# Patient Record
Sex: Female | Born: 1935 | Race: Black or African American | Hispanic: No | Smoking: Never smoker
Health system: Southern US, Community
[De-identification: ages and names within clinical notes are randomized; demographics above are authoritative.]

## PROBLEM LIST (undated history)

## (undated) DIAGNOSIS — I6529 Occlusion and stenosis of unspecified carotid artery: Secondary | ICD-10-CM

## (undated) DIAGNOSIS — K5090 Crohn's disease, unspecified, without complications: Secondary | ICD-10-CM

## (undated) DIAGNOSIS — E119 Type 2 diabetes mellitus without complications: Secondary | ICD-10-CM

## (undated) DIAGNOSIS — I10 Essential (primary) hypertension: Secondary | ICD-10-CM

## (undated) DIAGNOSIS — E785 Hyperlipidemia, unspecified: Secondary | ICD-10-CM

## (undated) HISTORY — DX: Occlusion and stenosis of unspecified carotid artery: I65.29

---

## 2003-11-19 ENCOUNTER — Other Ambulatory Visit: Payer: Self-pay

## 2005-08-30 ENCOUNTER — Ambulatory Visit: Payer: Self-pay | Admitting: *Deleted

## 2006-09-03 ENCOUNTER — Ambulatory Visit: Payer: Self-pay | Admitting: *Deleted

## 2006-09-10 ENCOUNTER — Ambulatory Visit: Payer: Self-pay | Admitting: Gastroenterology

## 2007-10-24 ENCOUNTER — Ambulatory Visit: Payer: Self-pay | Admitting: *Deleted

## 2008-09-14 ENCOUNTER — Ambulatory Visit: Payer: Self-pay | Admitting: Family Medicine

## 2009-01-07 ENCOUNTER — Ambulatory Visit: Payer: Self-pay | Admitting: Family Medicine

## 2009-10-18 ENCOUNTER — Ambulatory Visit: Payer: Self-pay | Admitting: Family Medicine

## 2010-01-10 ENCOUNTER — Ambulatory Visit: Payer: Self-pay | Admitting: Family Medicine

## 2010-01-17 ENCOUNTER — Ambulatory Visit: Payer: Self-pay | Admitting: Family Medicine

## 2011-02-21 ENCOUNTER — Ambulatory Visit: Payer: Self-pay | Admitting: Family Medicine

## 2011-10-27 ENCOUNTER — Emergency Department: Payer: Self-pay | Admitting: Emergency Medicine

## 2011-10-27 LAB — URINALYSIS, COMPLETE
Blood: NEGATIVE
Glucose,UR: NEGATIVE mg/dL (ref 0–75)
Hyaline Cast: 3
Nitrite: NEGATIVE
Protein: NEGATIVE
RBC,UR: 9 /HPF (ref 0–5)

## 2012-03-04 ENCOUNTER — Ambulatory Visit: Payer: Self-pay | Admitting: Family Medicine

## 2013-03-05 ENCOUNTER — Ambulatory Visit: Payer: Self-pay | Admitting: Family Medicine

## 2013-07-13 ENCOUNTER — Emergency Department: Payer: Self-pay | Admitting: Emergency Medicine

## 2014-12-31 ENCOUNTER — Inpatient Hospital Stay: Payer: Medicare Other

## 2014-12-31 ENCOUNTER — Emergency Department: Payer: Medicare Other

## 2014-12-31 ENCOUNTER — Encounter: Payer: Self-pay | Admitting: Urgent Care

## 2014-12-31 ENCOUNTER — Inpatient Hospital Stay
Admission: EM | Admit: 2014-12-31 | Discharge: 2015-01-07 | DRG: 385 | Disposition: A | Discharge status: Home / Self Care | Payer: Medicare Other | Attending: Internal Medicine | Admitting: Internal Medicine

## 2014-12-31 DIAGNOSIS — E785 Hyperlipidemia, unspecified: Secondary | ICD-10-CM | POA: Diagnosis present

## 2014-12-31 DIAGNOSIS — I10 Essential (primary) hypertension: Secondary | ICD-10-CM | POA: Diagnosis present

## 2014-12-31 DIAGNOSIS — K5731 Diverticulosis of large intestine without perforation or abscess with bleeding: Secondary | ICD-10-CM | POA: Diagnosis present

## 2014-12-31 DIAGNOSIS — Z79899 Other long term (current) drug therapy: Secondary | ICD-10-CM

## 2014-12-31 DIAGNOSIS — R609 Edema, unspecified: Secondary | ICD-10-CM | POA: Diagnosis present

## 2014-12-31 DIAGNOSIS — Z7982 Long term (current) use of aspirin: Secondary | ICD-10-CM

## 2014-12-31 DIAGNOSIS — K922 Gastrointestinal hemorrhage, unspecified: Secondary | ICD-10-CM | POA: Diagnosis present

## 2014-12-31 DIAGNOSIS — Z95828 Presence of other vascular implants and grafts: Secondary | ICD-10-CM

## 2014-12-31 DIAGNOSIS — D62 Acute posthemorrhagic anemia: Secondary | ICD-10-CM | POA: Diagnosis present

## 2014-12-31 DIAGNOSIS — Z90710 Acquired absence of both cervix and uterus: Secondary | ICD-10-CM

## 2014-12-31 DIAGNOSIS — Z888 Allergy status to other drugs, medicaments and biological substances status: Secondary | ICD-10-CM | POA: Diagnosis not present

## 2014-12-31 DIAGNOSIS — E119 Type 2 diabetes mellitus without complications: Secondary | ICD-10-CM | POA: Diagnosis present

## 2014-12-31 DIAGNOSIS — M1990 Unspecified osteoarthritis, unspecified site: Secondary | ICD-10-CM | POA: Diagnosis present

## 2014-12-31 DIAGNOSIS — K5010 Crohn's disease of large intestine without complications: Secondary | ICD-10-CM | POA: Diagnosis present

## 2014-12-31 DIAGNOSIS — K50919 Crohn's disease, unspecified, with unspecified complications: Secondary | ICD-10-CM

## 2014-12-31 DIAGNOSIS — D122 Benign neoplasm of ascending colon: Secondary | ICD-10-CM | POA: Diagnosis present

## 2014-12-31 DIAGNOSIS — K624 Stenosis of anus and rectum: Secondary | ICD-10-CM | POA: Diagnosis present

## 2014-12-31 DIAGNOSIS — K529 Noninfective gastroenteritis and colitis, unspecified: Secondary | ICD-10-CM | POA: Diagnosis present

## 2014-12-31 DIAGNOSIS — K921 Melena: Secondary | ICD-10-CM

## 2014-12-31 DIAGNOSIS — Z9049 Acquired absence of other specified parts of digestive tract: Secondary | ICD-10-CM | POA: Diagnosis present

## 2014-12-31 HISTORY — DX: Hyperlipidemia, unspecified: E78.5

## 2014-12-31 HISTORY — DX: Essential (primary) hypertension: I10

## 2014-12-31 HISTORY — DX: Crohn's disease, unspecified, without complications: K50.90

## 2014-12-31 HISTORY — DX: Type 2 diabetes mellitus without complications: E11.9

## 2014-12-31 LAB — COMPREHENSIVE METABOLIC PANEL
ALK PHOS: 65 U/L (ref 38–126)
ALT: 18 U/L (ref 14–54)
AST: 20 U/L (ref 15–41)
Albumin: 4.1 g/dL (ref 3.5–5.0)
Anion gap: 10 (ref 5–15)
BILIRUBIN TOTAL: 0.2 mg/dL — AB (ref 0.3–1.2)
BUN: 21 mg/dL — AB (ref 6–20)
CALCIUM: 9.1 mg/dL (ref 8.9–10.3)
CO2: 23 mmol/L (ref 22–32)
CREATININE: 1.17 mg/dL — AB (ref 0.44–1.00)
Chloride: 108 mmol/L (ref 101–111)
GFR calc Af Amer: 51 mL/min — ABNORMAL LOW (ref >60)
GFR, EST NON AFRICAN AMERICAN: 44 mL/min — AB (ref >60)
Glucose, Bld: 108 mg/dL — ABNORMAL HIGH (ref 65–99)
Potassium: 3.7 mmol/L (ref 3.5–5.1)
SODIUM: 141 mmol/L (ref 135–145)
Total Protein: 7 g/dL (ref 6.5–8.1)

## 2014-12-31 LAB — CBC
HCT: 35.9 % (ref 35.0–47.0)
HEMOGLOBIN: 12.1 g/dL (ref 12.0–16.0)
MCH: 30.7 pg (ref 26.0–34.0)
MCHC: 33.6 g/dL (ref 32.0–36.0)
MCV: 91.3 fL (ref 80.0–100.0)
PLATELETS: 166 10*3/uL (ref 150–440)
RBC: 3.93 MIL/uL (ref 3.80–5.20)
RDW: 13.7 % (ref 11.5–14.5)
WBC: 5.2 10*3/uL (ref 3.6–11.0)

## 2014-12-31 LAB — C DIFFICILE QUICK SCREEN W PCR REFLEX
C DIFFICILE (CDIFF) INTERP: NEGATIVE
C DIFFICLE (CDIFF) ANTIGEN: NEGATIVE
C Diff toxin: NEGATIVE

## 2014-12-31 LAB — GLUCOSE, CAPILLARY
GLUCOSE-CAPILLARY: 103 mg/dL — AB (ref 65–99)
GLUCOSE-CAPILLARY: 147 mg/dL — AB (ref 65–99)
Glucose-Capillary: 118 mg/dL — ABNORMAL HIGH (ref 65–99)

## 2014-12-31 LAB — MRSA PCR SCREENING: MRSA BY PCR: NEGATIVE

## 2014-12-31 LAB — HEMOGLOBIN
Hemoglobin: 9.2 g/dL — ABNORMAL LOW (ref 12.0–16.0)
Hemoglobin: 10.6 g/dL — ABNORMAL LOW (ref 12.0–16.0)

## 2014-12-31 LAB — ABO/RH: ABO/RH(D): A POS

## 2014-12-31 MED ORDER — SENNOSIDES-DOCUSATE SODIUM 8.6-50 MG PO TABS: 1.0000 | ORAL_TABLET | Freq: Every evening | ORAL | Status: DC | PRN

## 2014-12-31 MED ORDER — HYDROCODONE-ACETAMINOPHEN 5-325 MG PO TABS: 1.0000 | ORAL_TABLET | ORAL | Status: DC | PRN

## 2014-12-31 MED ORDER — ONDANSETRON HCL 4 MG PO TABS: 4.0000 mg | ORAL_TABLET | Freq: Four times a day (QID) | ORAL | Status: DC | PRN

## 2014-12-31 MED ORDER — QUINAPRIL HCL 10 MG PO TABS
40.0000 mg | ORAL_TABLET | Freq: Two times a day (BID) | ORAL | Status: DC
  Administered 2014-12-31 – 2015-01-07 (×12): 40 mg via ORAL
  Filled 2014-12-31 (×15): qty 4

## 2014-12-31 MED ORDER — IOHEXOL 300 MG/ML  SOLN
100.0000 mL | Freq: Once | INTRAMUSCULAR | Status: AC | PRN
Start: 2014-12-31 — End: 2014-12-31
  Administered 2014-12-31: 100 mL via INTRAVENOUS

## 2014-12-31 MED ORDER — PANTOPRAZOLE SODIUM 40 MG IV SOLR
40.0000 mg | Freq: Two times a day (BID) | INTRAVENOUS | Status: DC
  Administered 2014-12-31 – 2015-01-07 (×15): 40 mg via INTRAVENOUS
  Filled 2014-12-31 (×15): qty 40

## 2014-12-31 MED ORDER — ATENOLOL 50 MG PO TABS
50.0000 mg | ORAL_TABLET | Freq: Every day | ORAL | Status: DC
  Administered 2014-12-31 – 2015-01-07 (×8): 50 mg via ORAL
  Filled 2014-12-31 (×3): qty 1
  Filled 2014-12-31 (×2): qty 2
  Filled 2014-12-31 (×3): qty 1

## 2014-12-31 MED ORDER — ACETAMINOPHEN 650 MG RE SUPP: 650.0000 mg | Freq: Four times a day (QID) | RECTAL | Status: DC | PRN

## 2014-12-31 MED ORDER — CIPROFLOXACIN IN D5W 400 MG/200ML IV SOLN
400.0000 mg | Freq: Two times a day (BID) | INTRAVENOUS | Status: DC
  Administered 2014-12-31 – 2015-01-06 (×13): 400 mg via INTRAVENOUS
  Filled 2014-12-31 (×16): qty 200

## 2014-12-31 MED ORDER — INSULIN ASPART 100 UNIT/ML ~~LOC~~ SOLN
0.0000 [IU] | Freq: Every day | SUBCUTANEOUS | Status: DC
  Administered 2015-01-03: 2 [IU] via SUBCUTANEOUS
  Filled 2014-12-31: qty 2

## 2014-12-31 MED ORDER — METHYLPREDNISOLONE SODIUM SUCC 125 MG IJ SOLR
60.0000 mg | Freq: Two times a day (BID) | INTRAMUSCULAR | Status: DC
  Administered 2014-12-31 – 2015-01-05 (×10): 60 mg via INTRAVENOUS
  Filled 2014-12-31 (×10): qty 2

## 2014-12-31 MED ORDER — ALUM & MAG HYDROXIDE-SIMETH 200-200-20 MG/5ML PO SUSP: 30.0000 mL | Freq: Four times a day (QID) | ORAL | Status: DC | PRN

## 2014-12-31 MED ORDER — RISAQUAD PO CAPS
1.0000 | ORAL_CAPSULE | Freq: Every day | ORAL | Status: DC
  Administered 2014-12-31 – 2015-01-07 (×8): 1 via ORAL
  Filled 2014-12-31 (×8): qty 1

## 2014-12-31 MED ORDER — ACETAMINOPHEN 325 MG PO TABS
650.0000 mg | ORAL_TABLET | Freq: Four times a day (QID) | ORAL | Status: DC | PRN
Start: 2014-12-31 — End: 2015-01-07

## 2014-12-31 MED ORDER — METRONIDAZOLE IN NACL 5-0.79 MG/ML-% IV SOLN
500.0000 mg | Freq: Three times a day (TID) | INTRAVENOUS | Status: DC
  Administered 2014-12-31 – 2015-01-06 (×18): 500 mg via INTRAVENOUS
  Filled 2014-12-31 (×23): qty 100

## 2014-12-31 MED ORDER — FUROSEMIDE 40 MG PO TABS
40.0000 mg | ORAL_TABLET | Freq: Every day | ORAL | Status: DC
  Administered 2015-01-01 – 2015-01-07 (×7): 40 mg via ORAL
  Filled 2014-12-31 (×7): qty 1

## 2014-12-31 MED ORDER — ENOXAPARIN SODIUM 40 MG/0.4ML ~~LOC~~ SOLN: 40.0000 mg | SUBCUTANEOUS | Status: DC

## 2014-12-31 MED ORDER — INSULIN ASPART 100 UNIT/ML ~~LOC~~ SOLN
0.0000 [IU] | Freq: Three times a day (TID) | SUBCUTANEOUS | Status: DC
  Administered 2015-01-02 – 2015-01-04 (×4): 1 [IU] via SUBCUTANEOUS
  Administered 2015-01-04: 2 [IU] via SUBCUTANEOUS
  Administered 2015-01-05: 1 [IU] via SUBCUTANEOUS
  Administered 2015-01-05: 2 [IU] via SUBCUTANEOUS
  Administered 2015-01-05: 1 [IU] via SUBCUTANEOUS
  Administered 2015-01-06: 2 [IU] via SUBCUTANEOUS
  Administered 2015-01-06: 1 [IU] via SUBCUTANEOUS
  Administered 2015-01-06: 3 [IU] via SUBCUTANEOUS
  Administered 2015-01-07: 1 [IU] via SUBCUTANEOUS
  Administered 2015-01-07: 3 [IU] via SUBCUTANEOUS
  Filled 2014-12-31 (×3): qty 2
  Filled 2014-12-31 (×3): qty 1
  Filled 2014-12-31: qty 3
  Filled 2014-12-31: qty 1
  Filled 2014-12-31: qty 3
  Filled 2014-12-31 (×4): qty 1

## 2014-12-31 MED ORDER — PRAVASTATIN SODIUM 10 MG PO TABS
10.0000 mg | ORAL_TABLET | Freq: Every day | ORAL | Status: DC
  Administered 2014-12-31 – 2015-01-02 (×3): 10 mg via ORAL
  Filled 2014-12-31: qty 1
  Filled 2014-12-31: qty 2
  Filled 2014-12-31: qty 1

## 2014-12-31 MED ORDER — IOHEXOL 240 MG/ML SOLN
25.0000 mL | Freq: Once | INTRAMUSCULAR | Status: AC | PRN
  Administered 2014-12-31: 25 mL via ORAL

## 2014-12-31 MED ORDER — SODIUM CHLORIDE 0.9 % IV SOLN
INTRAVENOUS | Status: DC
  Administered 2014-12-31: 100 mL/h via INTRAVENOUS
  Administered 2014-12-31 – 2015-01-06 (×9): via INTRAVENOUS

## 2014-12-31 MED ORDER — CALCIUM CARBONATE-VITAMIN D 500-200 MG-UNIT PO TABS
1.0000 | ORAL_TABLET | Freq: Two times a day (BID) | ORAL | Status: DC
  Administered 2014-12-31 – 2015-01-07 (×14): 1 via ORAL
  Filled 2014-12-31 (×15): qty 1

## 2014-12-31 MED ORDER — ONDANSETRON HCL 4 MG/2ML IJ SOLN: 4.0000 mg | Freq: Four times a day (QID) | INTRAMUSCULAR | Status: DC | PRN

## 2014-12-31 MED ORDER — CLONIDINE HCL 0.1 MG PO TABS
0.3000 mg | ORAL_TABLET | Freq: Two times a day (BID) | ORAL | Status: DC
  Administered 2014-12-31 – 2015-01-07 (×15): 0.3 mg via ORAL
  Filled 2014-12-31 (×15): qty 3

## 2014-12-31 MED ORDER — AMLODIPINE BESYLATE 10 MG PO TABS
10.0000 mg | ORAL_TABLET | Freq: Every day | ORAL | Status: DC
  Administered 2015-01-01 – 2015-01-07 (×7): 10 mg via ORAL
  Filled 2014-12-31 (×8): qty 1

## 2014-12-31 NOTE — ED Notes (Signed)
Pt to ED c/o dark red colored blood in stool when she had a BM at approx 0330 this AM.

## 2014-12-31 NOTE — Progress Notes (Signed)
ANTIBIOTIC CONSULT NOTE - INITIAL  Pharmacy Consult for Cipro  Indication: unspecified indication   Allergies  Allergen Reactions  . Advil [Ibuprofen] Rash    Patient Measurements: Height: 5\' 7"  (170.2 cm) Weight: 209 lb 5 oz (94.944 kg) IBW/kg (Calculated) : 61.6 Adjusted Body Weight:   Vital Signs: Temp: 98.4 F (36.9 C) (08/05 1010) Temp Source: Oral (08/05 1010) BP: 140/78 mmHg (08/05 1010) Pulse Rate: 64 (08/05 1010) Intake/Output from previous day:   Intake/Output from this shift:    Labs:  Recent Labs  12/31/14 0500 12/31/14 0528  WBC  --  5.2  HGB  --  12.1  PLT  --  166  CREATININE 1.17*  --    Estimated Creatinine Clearance: 47.6 mL/min (by C-G formula based on Cr of 1.17). No results for input(s): VANCOTROUGH, VANCOPEAK, VANCORANDOM, GENTTROUGH, GENTPEAK, GENTRANDOM, TOBRATROUGH, TOBRAPEAK, TOBRARND, AMIKACINPEAK, AMIKACINTROU, AMIKACIN in the last 72 hours.   Microbiology: No results found for this or any previous visit (from the past 720 hour(s)).  Medical History: Past Medical History  Diagnosis Date  . Crohn's disease   . Hypertension   . Diabetes mellitus without complication   . Hyperlipemia     Medications:  Scheduled:  . acidophilus  1 capsule Oral Daily  . amLODipine  10 mg Oral Daily  . atenolol  50 mg Oral Daily  . calcium-vitamin D  1 tablet Oral BID  . ciprofloxacin  400 mg Intravenous Q12H  . cloNIDine  0.3 mg Oral BID  . enoxaparin (LOVENOX) injection  40 mg Subcutaneous Q24H  . [START ON 01/01/2015] furosemide  40 mg Oral Daily  . metronidazole  500 mg Intravenous Q8H  . pantoprazole (PROTONIX) IV  40 mg Intravenous Q12H  . pravastatin  10 mg Oral q1800  . quinapril  40 mg Oral BID   Assessment: Patient being treated for unspecified indication.   Goal of Therapy:  Resolution of condition   Plan:  Will continue cipro 400 mg iv q12 hours.   Tyliah Schlereth D 12/31/2014,10:38 AM

## 2014-12-31 NOTE — Progress Notes (Signed)
PT WITH  LARGE VOLUME BLOODY STOOL,LOOSE RED AND MULTIPLE CLOTS. DR MODY NOTIFIED. MD ORDERS PROTONIX  40MG  IV  BID AND HEMOGLOBIN AT 1400

## 2014-12-31 NOTE — Consult Note (Addendum)
Consultation  Referring Provider:     Dr Benjie Karvonen Admit date: 12/31/14 Consult date      12/31/14   Reason for Consultation:     Colitis         HPI:   Kaitlin Gomez is a 80 y.o. 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 Ileostomy with eventual takedown (2004). States she has had no known problems with her Crohns since her surgery. Last colonoscopy was 2008 by Dr Allen Norris: this revealed and anal stricture, diffuse distal ulcerations and granular tissue, and an inflamed ileocecal valve. Biopsies significant for active colitis to IC and distal biopsies, as well as an inflammatory polyp. States she has been on no medications for her Crohns disease since her surgery, doesn't remember what she took. Used to follow with Dr Rockney Ghee as well.  States she was in her usual state of health until 0300 this am. Woke with a dry mouth, then went to defecate. Passed stool and large amount of red blood. Had several other bloody stools, and presented to the ED this am for evaluation, and was subsequently admitted. States she has had no significant abdominal pain, and states no unusual crampiness. Had nausea for a little while when she got to her hospital room, but not now or at other times. Denies vomiting, dyspepsia, unplanned weight loss, and all further GI complaints. Denies recent antibiotics, medication changes, sick contacts, dietary changes, and anything out of the ordinary prior to symptom onset. Does admit to a monthly loose stool, and intermittent mild lower abdominal cramps, but states overall she feels very well in her abdomen, and does not normally see any blood or unusual issue in her bowel movments. States she eats well and has good appetite. Is on meloxicam for arthritis, but no other NSAIDs.     Past Medical History   Diagnosis Date  . Crohn's disease   . Hypertension   . Diabetes mellitus without complication   . Hyperlipemia   Arthritis Carpal Tunnel  History reviewed: Carpal Tunnel surgery, hysterectomy, sigmoid resection with eventual takedown (2004). Possible proximal colon resection, but cannot find any other evidence of this other than brief note in [facility] chart - may have been done 1996.   History reviewed. There is no family history of IBD, colorectal cancer, colon polyps  History  Substance Use Topics  . Smoking status: Never Smoker   . Smokeless tobacco: Not on file  . Alcohol Use: No    Prior to Admission medications   Medication Sig Start Date End Date Taking? Authorizing Provider  amLODipine (NORVASC) 10 MG tablet Take 10 mg by mouth daily.   Yes Historical Provider, MD  aspirin 81 MG chewable tablet Chew 81 mg by mouth daily.   Yes Historical Provider, MD  atenolol (TENORMIN) 50 MG tablet Take 50 mg by mouth daily.   Yes Historical Provider, MD  calcium-vitamin D (OSCAL 500/200 D-3) 500-200 MG-UNIT per tablet Take 1 tablet by mouth 2 (two) times daily.   Yes Historical Provider, MD  cloNIDine (CATAPRES) 0.3 MG tablet Take 0.3 mg by mouth.   Yes Historical Provider, MD  furosemide (LASIX) 40 MG tablet Take 40 mg by mouth daily.   Yes Historical Provider, MD  lovastatin (MEVACOR) 40 MG tablet Take 40 mg by mouth at bedtime.   Yes Historical Provider, MD  metFORMIN (  GLUCOPHAGE) 500 MG tablet Take 500 mg by mouth daily.   Yes Historical Provider, MD  quinapril (ACCUPRIL) 40 MG tablet Take 40 mg by mouth 2 (two) times daily.   Yes Historical Provider, MD  meloxicam (MOBIC) 7.5 MG tablet Take 7.5 mg by mouth daily.    Historical Provider, MD    Current Facility-Administered Medications  Medication Dose Route Frequency Provider Last Rate Last Dose  . 0.9 %  sodium chloride infusion   Intravenous Continuous Bettey Costa, MD 100 mL/hr at 12/31/14 1038    . acetaminophen (TYLENOL) tablet 650  mg  650 mg Oral Q6H PRN Bettey Costa, MD       Or  . acetaminophen (TYLENOL) suppository 650 mg  650 mg Rectal Q6H PRN Bettey Costa, MD      . acidophilus (RISAQUAD) capsule 1 capsule  1 capsule Oral Daily Bettey Costa, MD   1 capsule at 12/31/14 1155  . alum & mag hydroxide-simeth (MAALOX/MYLANTA) 200-200-20 MG/5ML suspension 30 mL  30 mL Oral Q6H PRN Sital Mody, MD      . amLODipine (NORVASC) tablet 10 mg  10 mg Oral Daily Sital Mody, MD      . atenolol (TENORMIN) tablet 50 mg  50 mg Oral Daily Sital Mody, MD   50 mg at 12/31/14 1155  . calcium-vitamin D (OSCAL WITH D) 500-200 MG-UNIT per tablet 1 tablet  1 tablet Oral BID Bettey Costa, MD   1 tablet at 12/31/14 1155  . ciprofloxacin (CIPRO) IVPB 400 mg  400 mg Intravenous Q12H Bettey Costa, MD   400 mg at 12/31/14 1154  . cloNIDine (CATAPRES) tablet 0.3 mg  0.3 mg Oral BID Bettey Costa, MD   0.3 mg at 12/31/14 1156  . [START ON 01/01/2015] furosemide (LASIX) tablet 40 mg  40 mg Oral Daily Sital Mody, MD      . HYDROcodone-acetaminophen (NORCO/VICODIN) 5-325 MG per tablet 1-2 tablet  1-2 tablet Oral Q4H PRN Sital Mody, MD      . insulin aspart (novoLOG) injection 0-5 Units  0-5 Units Subcutaneous QHS Sital Mody, MD      . insulin aspart (novoLOG) injection 0-9 Units  0-9 Units Subcutaneous TID WC Sital Mody, MD      . methylPREDNISolone sodium succinate (SOLU-MEDROL) 125 mg/2 mL injection 60 mg  60 mg Intravenous Q12H Sital Mody, MD      . metroNIDAZOLE (FLAGYL) IVPB 500 mg  500 mg Intravenous Q8H Sital Mody, MD      . ondansetron (ZOFRAN) tablet 4 mg  4 mg Oral Q6H PRN Bettey Costa, MD       Or  . ondansetron (ZOFRAN) injection 4 mg  4 mg Intravenous Q6H PRN Sital Mody, MD      . pantoprazole (PROTONIX) injection 40 mg  40 mg Intravenous Q12H Sital Mody, MD   40 mg at 12/31/14 1155  . pravastatin (PRAVACHOL) tablet 10 mg  10 mg Oral q1800 Sital Mody, MD      . quinapril (ACCUPRIL) tablet 40 mg  40 mg Oral BID Bettey Costa, MD   40 mg at 12/31/14 1155     Allergies as of 12/31/2014 - Review Complete 12/31/2014  Allergen Reaction Noted  . Advil [ibuprofen] Rash 12/31/2014     Review of Systems:    All systems reviewed and negative except where noted in HPI.    Physical Exam:  Vital signs in last 24 hours: Temp:  [97.9 F (36.6 C)-98.4 F (36.9 C)] 98.4 F (36.9 C) (08/05  1010) Pulse Rate:  [49-85] 64 (08/05 1010) Resp:  [16-20] 18 (08/05 1010) BP: (103-157)/(53-103) 140/78 mmHg (08/05 1010) SpO2:  [99 %-100 %] 100 % (08/05 1010) Weight:  [86.183 kg (190 lb)-94.944 kg (209 lb 5 oz)] 94.944 kg (209 lb 5 oz) (08/05 1018)   General:   Pleasant elderly woman in NAD Head:  Normocephalic and atraumatic. Eyes:   No icterus.   Conjunctiva pink. Ears:  Normal auditory acuity. Mouth: Mucosa pink moist, no lesions. Neck:  Supple; no masses felt Lungs: Respirations even and unlabored. Lungs clear to auscultation bilaterally.   No wheezes, crackles, or rhonchi.  Heart:  S1S2, RRR, no MRG. No edema. Abdomen:   Flat, soft, nondistended, nontender. Normal bowel sounds. No appreciable masses or hepatomegaly. No rebound signs or other peritoneal signs. Rectal:  Significant anal stricture. Tender. Bloody effluent. Cannot traverse stricture.  Msk:  MAEW x4, No clubbing or cyanosis. Strength 5/5. Symmetrical without gross deformities. Neurologic:  Alert and  oriented x4;  Cranial nerves II-XII intact.  Skin:  Warm, dry, pink without significant lesions or rashes. Psych:  Alert and cooperative. Normal affect.  LAB RESULTS:  Recent Labs  12/31/14 0528  WBC 5.2  HGB 12.1  HCT 35.9  PLT 166   BMET  Recent Labs  12/31/14 0500  NA 141  K 3.7  CL 108  CO2 23  GLUCOSE 108*  BUN 21*  CREATININE 1.17*  CALCIUM 9.1   LFT  Recent Labs  12/31/14 0500  PROT 7.0  ALBUMIN 4.1  AST 20  ALT 18  ALKPHOS 65  BILITOT 0.2*   PT/INR No results for input(s): LABPROT, INR in the last 72 hours.  STUDIES: Ct Abdomen Pelvis W  Contrast  12/31/2014   CLINICAL DATA:  Hematochezia.  EXAM: CT ABDOMEN AND PELVIS WITH CONTRAST  TECHNIQUE: Multidetector CT imaging of the abdomen and pelvis was performed using the standard protocol following bolus administration of intravenous contrast.  CONTRAST:  167mL OMNIPAQUE IOHEXOL 300 MG/ML  SOLN  COMPARISON:  None.  FINDINGS: There appears to be prior left hemi colectomy with unremarkable appearances of the anastomosis. There is abnormal mural thickening and mural enhancement in the distal 15 cm of colon. This could represent colitis or ischemia. There is no bowel obstruction. There is no extraluminal air.  There are normal appearances of the liver, gallbladder, bile ducts, spleen, pancreas, adrenals and kidneys except for left nephrolithiasis with at least 3 lower pole collecting system calculi measuring up to 6 mm. The abdominal aorta is normal in caliber. There is moderate atherosclerotic calcification. There is no adenopathy in the abdomen or pelvis. The stomach and small bowel appear unremarkable. There is no adenopathy. There is no ascites.  There is no significant skeletal lesion. There is moderately severe degenerative lumbar disc disease, greatest at L5-S1. There is minimal scarring or atelectasis in the right lung base.  IMPRESSION: 1. Prior left hemi colectomy with unremarkable anastomosis. 2. Abnormal mural thickening and mural enhancement in the distal 15 cm of colon, likely colitis or ischemia. 3. Left nephrolithiasis 4. Lumbar degenerative disc disease   Electronically Signed   By: Andreas Newport M.D.   On: 12/31/2014 06:47       Impression / Plan:   1. Rectal bleeding/crohns disease/abnormal CT/abnormal rectal exam. Diverticular bleeding is also in her ddx. She is hemodynamically stable and last bm an hour ago- by her report this seems to be slowing.   Agree with following hgb, transfuse prn. Agree with stool  studies/abx, will likely need luminal evaluation. For now, if she has  a significant bleeding episode, recommend stat GIB scan. Will discuss further with Dr Gustavo Lah.   Thank you very much for this consult. These services were provided by Stephens November, NP-C, in collaboration with Lollie Sails, MD, with whom I have discussed this patient in full.   Stephens November, NP-C  Patient with repeat episode of rectal bleeding, bright red, at about 6:50 pm.  Mild dizziness, mild decline of bp.  Rectal exam showing relatively high grade stricture in the upper anal canal that does not admit the little finger on exam.  Abnormal rectal also from firmness/nodularity  of the anal canal.  Will obtain stat GI bleeding scan, discussed with DR Anselm Jungling and Dr Ronalee Belts.  She may not be a candidate for microembolization due to previous disease and high risk of complication on procedure.  Agree with transfer to CCU for closer observation, serial hgb.  Awaitnign results of stat hgb.  Will need t+s.   Lollie Sails, MD

## 2014-12-31 NOTE — Progress Notes (Signed)
RN SPOKE TO DR MODY. RE: ACTIVELY BLEEDING  AND LOVENOX ORDERED. MD FEELS PT HAS CROHNS FLARE. MD REPORTS SHE WILL HOLD LOVENOX AT THIS TIME

## 2014-12-31 NOTE — ED Notes (Signed)
Lab called to request redraw on lavender tube due to sample hemolysis. Recollected lavender and blue top tubes and sent to lab as requested. Samples obtained from previously established PIV.

## 2014-12-31 NOTE — Progress Notes (Signed)
RN RECEIVED CALL FROM CCU. HR DOWN TO 44. VSS. PT ASYMPTOMATIC. PT RECEIVED ATENOLOL TODAY. MD REPORTS WILL RE-EVAL IN AM IF NO SX

## 2014-12-31 NOTE — Progress Notes (Signed)
Patient transported to nuclear med via bed for bleed scan . Reported to [facility] RN in ccu where patient will be transferred  To bed 5 past  scan ,IV  fluids  infusing .Marland Kitchen Telemetry  in place  Sinus brady 59 . Alert and oriented. VSS patient in no distress.

## 2014-12-31 NOTE — ED Provider Notes (Signed)
[facility] Emergency Department Provider Note  ____________________________________________  Time seen: 5:00 AM  I have reviewed the triage vital signs and the nursing notes.   HISTORY  Chief Complaint Rectal Bleeding      HPI Kaitlin Gomez is a 80 y.o. female presents with acute onset of bright red blood per rectum with onset at 3:30 AM this morning. Patient also admits to mild abdominal discomfort. Of note patient to history of Crohn's disease and partial bowel resection in the past.    Past Medical History  Diagnosis Date  . Crohn's disease   . Hypertension   . Diabetes mellitus without complication   . Hyperlipemia     There are no active problems to display for this patient.   No past surgical history on file.  Current Outpatient Rx  Name  Route  Sig  Dispense  Refill  . amLODipine (NORVASC) 10 MG tablet   Oral   Take 10 mg by mouth daily.         Marland Kitchen aspirin 81 MG chewable tablet   Oral   Chew 81 mg by mouth daily.         Marland Kitchen atenolol (TENORMIN) 50 MG tablet   Oral   Take 50 mg by mouth daily.         . calcium-vitamin D (OSCAL 500/200 D-3) 500-200 MG-UNIT per tablet   Oral   Take 1 tablet by mouth 2 (two) times daily.         . cloNIDine (CATAPRES) 0.3 MG tablet   Oral   Take 0.3 mg by mouth.         . furosemide (LASIX) 40 MG tablet   Oral   Take 40 mg by mouth daily.         Marland Kitchen lovastatin (MEVACOR) 40 MG tablet   Oral   Take 40 mg by mouth at bedtime.         . meloxicam (MOBIC) 7.5 MG tablet   Oral   Take 7.5 mg by mouth daily.         . metFORMIN (GLUCOPHAGE) 500 MG tablet   Oral   Take 500 mg by mouth daily.         . quinapril (ACCUPRIL) 40 MG tablet   Oral   Take 40 mg by mouth 2 (two) times daily.           Allergies Advil  No family history on file.  Social History History  Substance Use Topics  . Smoking status: Never Smoker   . Smokeless tobacco: Not on file  . Alcohol  Use: No    Review of Systems  Constitutional: Negative for fever. Eyes: Negative for visual changes. ENT: Negative for sore throat. Cardiovascular: Negative for chest pain. Respiratory: Negative for shortness of breath. Gastrointestinal: Negative for abdominal pain, vomiting and diarrhea. Bright red blood per rectum positive Genitourinary: Negative for dysuria. Musculoskeletal: Negative for back pain. Skin: Negative for rash. Neurological: Negative for headaches, focal weakness or numbness.   10-point ROS otherwise negative.  ____________________________________________   PHYSICAL EXAM:  VITAL SIGNS: ED Triage Vitals  Enc Vitals Group     BP 12/31/14 0432 128/103 mmHg     Pulse Rate 12/31/14 0432 66     Resp 12/31/14 0432 20     Temp 12/31/14 0432 98.3 F (36.8 C)     Temp Source 12/31/14 0432 Oral     SpO2 12/31/14 0432 99 %     Weight  12/31/14 0432 190 lb (86.183 kg)     Height 12/31/14 0432 5\' 7"  (1.702 m)     Head Cir --      Peak Flow --      Pain Score 12/31/14 0433 0     Pain Loc --      Pain Edu? --      Excl. in [place]? --      Constitutional: Alert and oriented. Well appearing and in no distress. Eyes: Conjunctivae are normal. PERRL. Normal extraocular movements. ENT   Head: Normocephalic and atraumatic.   Nose: No congestion/rhinnorhea.   Mouth/Throat: Mucous membranes are moist.   Neck: No stridor. Cardiovascular: Normal rate, regular rhythm. Normal and symmetric distal pulses are present in all extremities. No murmurs, rubs, or gallops. Respiratory: Normal respiratory effort without tachypnea nor retractions. Breath sounds are clear and equal bilaterally. No wheezes/rales/rhonchi. Gastrointestinal: Left lower quadrant pain with palpation. No distention. There is no CVA tenderness. Guaiac positive bright red blood per rectum Genitourinary: deferred Musculoskeletal: Nontender with normal range of motion in all extremities. No joint effusions.   No lower extremity tenderness nor edema. Neurologic:  Normal speech and language. No gross focal neurologic deficits are appreciated. Speech is normal.  Skin:  Skin is warm, dry and intact. No rash noted. Psychiatric: Mood and affect are normal. Speech and behavior are normal. Patient exhibits appropriate insight and judgment.  ____________________________________________    LABS (pertinent positives/negatives)  Labs Reviewed  COMPREHENSIVE METABOLIC PANEL - Abnormal; Notable for the following:    Glucose, Bld 108 (*)    BUN 21 (*)    Creatinine, Ser 1.17 (*)    Total Bilirubin 0.2 (*)    GFR calc non Af Amer 44 (*)    GFR calc Af Amer 51 (*)    All other components within normal limits  CBC  TYPE AND SCREEN       RADIOLOGY    CT Abdomen Pelvis W Contrast (Final result) Result time: 12/31/14 06:47:47   Final result by Rad Results In Interface (12/31/14 06:47:47)   Narrative:   CLINICAL DATA: Hematochezia.  EXAM: CT ABDOMEN AND PELVIS WITH CONTRAST  TECHNIQUE: Multidetector CT imaging of the abdomen and pelvis was performed using the standard protocol following bolus administration of intravenous contrast.  CONTRAST: 160mL OMNIPAQUE IOHEXOL 300 MG/ML SOLN  COMPARISON: None.  FINDINGS: There appears to be prior left hemi colectomy with unremarkable appearances of the anastomosis. There is abnormal mural thickening and mural enhancement in the distal 15 cm of colon. This could represent colitis or ischemia. There is no bowel obstruction. There is no extraluminal air.  There are normal appearances of the liver, gallbladder, bile ducts, spleen, pancreas, adrenals and kidneys except for left nephrolithiasis with at least 3 lower pole collecting system calculi measuring up to 6 mm. The abdominal aorta is normal in caliber. There is moderate atherosclerotic calcification. There is no adenopathy in the abdomen or pelvis. The stomach and small bowel appear  unremarkable. There is no adenopathy. There is no ascites.  There is no significant skeletal lesion. There is moderately severe degenerative lumbar disc disease, greatest at L5-S1. There is minimal scarring or atelectasis in the right lung base.  IMPRESSION: 1. Prior left hemi colectomy with unremarkable anastomosis. 2. Abnormal mural thickening and mural enhancement in the distal 15 cm of colon, likely colitis or ischemia. 3. Left nephrolithiasis 4. Lumbar degenerative disc disease   Electronically Signed By: Andreas Newport M.D. On: 12/31/2014 06:47  INITIAL IMPRESSION / ASSESSMENT AND PLAN / ED COURSE  Pertinent labs & imaging results that were available during my care of the patient were reviewed by me and considered in my medical decision making (see chart for details).    ____________________________________________   FINAL CLINICAL IMPRESSION(S) / ED DIAGNOSES  Final diagnoses:  Lower GI bleed  Crohn's disease, unspecified complication      Gregor Hams, MD 01/05/15 [phone]

## 2014-12-31 NOTE — ED Notes (Signed)
Attempted to call report. RN who is receiving pt is in meeting and is also charge. Will call back

## 2014-12-31 NOTE — Progress Notes (Signed)
I personally reviewed her CT scan. Her SMA is widely patent. 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 IMA is also patent. Given this pattern it is highly unlikely that she has mesenteric ischemia based on low arterial inflow. Given her history of Crohn's and her past surgeries it is more likely that these are related to her presenting complaints.  At this time I do not anticipate angiography or intervention.  Full consult to follow

## 2014-12-31 NOTE — H&P (Signed)
[facility] [place] NAME: Kaitlin Gomez    MR#:  [id number]  DATE OF BIRTH:  12/29/1934  DATE OF ADMISSION:  12/31/2014  PRIMARY CARE PHYSICIAN: Marguerita Merles, MD   REQUESTING/REFERRING PHYSICIAN: Dr Edd Fabian  CHIEF COMPLAINT:  Abdominal pain with bloody bowel movements since 3 AM HISTORY OF PRESENT ILLNESS:  Kaitlin Gomez  is a 80 y.o. female with a known history of Crohn's disease, hypertension and diabetes who presents with above complaint. Since 3 AM patient's had for episodes of bloody diarrhea and abdominal pain. She is also having some nausea without vomiting. She denies eating anything abnormal. She denies fever and chills.  PAST MEDICAL HISTORY:   Past Medical History  Diagnosis Date  . Crohn's disease   . Hypertension   . Diabetes mellitus without complication   . Hyperlipemia     PAST SURGICAL HISTORY:  Hysterectomy Part of her small intestine removed  SOCIAL HISTORY:   History  Substance Use Topics  . Smoking status: Never Smoker   . Smokeless tobacco: Not on file  . Alcohol Use: No    FAMILY HISTORY:  Her folks died of old age.  DRUG ALLERGIES:   Allergies  Allergen Reactions  . Advil [Ibuprofen] Rash     REVIEW OF SYSTEMS:  CONSTITUTIONAL: No fever or chills positive weakness  EYES: No blurred or double vision.  EARS, NOSE, AND THROAT: No tinnitus or ear pain.  RESPIRATORY: No cough, shortness of breath, wheezing or hemoptysis.  CARDIOVASCULAR: No chest pain, orthopnea,++Chornic LEE.  GASTROINTESTINAL: Positive nausea, no vomiting, positive for bloody stools GENITOURINARY: No dysuria, hematuria.  ENDOCRINE: No polyuria, nocturia,  HEMATOLOGY: No anemia, easy bruising or bleeding SKIN: No rash or lesion. MUSCULOSKELETAL: No joint pain or arthritis.   NEUROLOGIC: No tingling, numbness, positive generalized weakness.  PSYCHIATRY: No anxiety or depression.   MEDICATIONS AT HOME:   Prior to  Admission medications   Medication Sig Start Date End Date Taking? Authorizing Provider  amLODipine (NORVASC) 10 MG tablet Take 10 mg by mouth daily.   Yes Historical Provider, MD  aspirin 81 MG chewable tablet Chew 81 mg by mouth daily.   Yes Historical Provider, MD  atenolol (TENORMIN) 50 MG tablet Take 50 mg by mouth daily.   Yes Historical Provider, MD  calcium-vitamin D (OSCAL 500/200 D-3) 500-200 MG-UNIT per tablet Take 1 tablet by mouth 2 (two) times daily.   Yes Historical Provider, MD  cloNIDine (CATAPRES) 0.3 MG tablet Take 0.3 mg by mouth.   Yes Historical Provider, MD  furosemide (LASIX) 40 MG tablet Take 40 mg by mouth daily.   Yes Historical Provider, MD  lovastatin (MEVACOR) 40 MG tablet Take 40 mg by mouth at bedtime.   Yes Historical Provider, MD  metFORMIN (GLUCOPHAGE) 500 MG tablet Take 500 mg by mouth daily.   Yes Historical Provider, MD  quinapril (ACCUPRIL) 40 MG tablet Take 40 mg by mouth 2 (two) times daily.   Yes Historical Provider, MD  meloxicam (MOBIC) 7.5 MG tablet Take 7.5 mg by mouth daily.    Historical Provider, MD      VITAL SIGNS:  Blood pressure 140/78, pulse 64, temperature 98.4 F (36.9 C), temperature source Oral, resp. rate 18, height 5\' 7"  (1.702 m), weight 94.944 kg (209 lb 5 oz), SpO2 100 %.  PHYSICAL EXAMINATION:  GENERAL:  80 y.o.-year-old patient lying in the bed with no acute distress.  EYES: Pupils equal, round, reactive to light and accommodation.  No scleral icterus. Extraocular muscles intact.  HEENT: Head atraumatic, normocephalic. Oropharynx and nasopharynx clear.  NECK:  Supple, no jugular venous distention. No thyroid enlargement, no tenderness.  LUNGS: Normal breath sounds bilaterally, no wheezing, rales,rhonchi or crepitation. No use of accessory muscles of respiration.  CARDIOVASCULAR: S1, S2 normal. No murmurs, rubs, or gallops.  ABDOMEN: Soft, nontender, nondistended. Bowel sounds present. No organomegaly or mass.  EXTREMITIES:  ++LEE, NO cyanosis, or clubbing.  NEUROLOGIC: Cranial nerves II through XII are grossly intact. No focal deficits. PSYCHIATRIC: The patient is alert and oriented x 3.  SKIN: No obvious rash, lesion, or ulcer.   LABORATORY PANEL:   CBC  Recent Labs Lab 12/31/14 0528  WBC 5.2  HGB 12.1  HCT 35.9  PLT 166   ------------------------------------------------------------------------------------------------------------------  Chemistries   Recent Labs Lab 12/31/14 0500  NA 141  K 3.7  CL 108  CO2 23  GLUCOSE 108*  BUN 21*  CREATININE 1.17*  CALCIUM 9.1  AST 20  ALT 18  ALKPHOS 65  BILITOT 0.2*   ------------------------------------------------------------------------------------------------------------------  Cardiac Enzymes No results for input(s): TROPONINI in the last 168 hours. ------------------------------------------------------------------------------------------------------------------  RADIOLOGY:  Ct Abdomen Pelvis W Contrast  12/31/2014    IMPRESSION: 1. Prior left hemi colectomy with unremarkable anastomosis. 2. Abnormal mural thickening and mural enhancement in the distal 15 cm of colon, likely colitis or ischemia. 3. Left nephrolithiasis 4. Lumbar degenerative disc disease   Electronically Signed   By: Andreas Newport M.D.   On: 12/31/2014 06:47    EKG:    IMPRESSION AND PLAN:  This is a very pleasant 80 year old female with a history of Crohn disease who presents with bloody stools and likely Crohn's flare.   1. Crohn's flare: Patient will be admitted to the hospital service. I have started empiric antibiotics including ciprofloxacin and Flagyl. Patient will also be started on IV steroids. GI has been consulted for further evaluation and management. We will continue to monitor her hemoglobin and transfuse if needed. Patient will be on a clear liquid diet for now. Further recommendations after GI consultation. I have also ordered C. difficile cultures as  well as stool cultures.  2. Hypertension: Patient should continue her outpatient medications including Norvasc, atenolol, clonidine and quinapril.  3. Diabetes: Patient will be on siding scale insulin area and metformin will be held  4. Lower extreme edema: Continue Lasix.   5. Hyperlipidemia: Continue Mevacor    All the records are reviewed and case discussed with ED provider. Management plans discussed with the patient and she is in agreement.  CODE STATUS: FULL  TOTAL TIME TAKING CARE OF THIS PATIENT: 35 minutes.    Lissy Deuser M.D on 12/31/2014 at 11:09 AM  Between 7am to 6pm - Pager - [phone] After 6pm go to [email or web address] - password [email or web address] [facility]  Office  [phone]  CC: Primary care physician; Marguerita Merles, MD

## 2014-12-31 NOTE — Progress Notes (Signed)
RN  WITH DR Donnella Sham ON ROUNDS. PT DIZZY STILL. VERY WEAK. HAD ANOTHER LARGE BLOODY STOOL. FOUL ODOR. DR Darletta Moll TO ORDER BLEEDING SCAN AND REQUESTS TO HAVE PT TRANSFERRED TO CCU D/T ACTIVE BLEEDING . RN PAGED DR Buck Mam SO THAT MD COULD DISCUSS CASE AND GET PT TRANSFERRED TO CCU

## 2014-12-31 NOTE — ED Notes (Signed)
Patient has completed PO contrast volume - CT made aware.

## 2014-12-31 NOTE — ED Notes (Signed)
RN to bedside for patient reassessment. Patient just returning to bed from toilet. Patient has had 2-3 additional episodes of passing BRBPR since previous notes regarding her stools/passing blood. Patient's VS remain stable. Updated on plan of care; awaiting CT results at this time. Patient has been advised of need for admission; verbalizes understanding and consent. Patient continues to deny pain. No verbalized needs at this time other than her questioning whether or not she will be given per routine HTN medications.

## 2014-12-31 NOTE — Progress Notes (Signed)
ANOTHER LARGE VOLUME LOOSE BLOODY STOOL WITH CLOTS. HGB DOWN TO 9.2. ALMOST 3 PT DROP. MD Lawson

## 2015-01-01 LAB — CBC
HEMATOCRIT: 26.6 % — AB (ref 35.0–47.0)
Hemoglobin: 9.1 g/dL — ABNORMAL LOW (ref 12.0–16.0)
MCH: 30.9 pg (ref 26.0–34.0)
MCHC: 34.1 g/dL (ref 32.0–36.0)
MCV: 90.7 fL (ref 80.0–100.0)
PLATELETS: 157 10*3/uL (ref 150–440)
RBC: 2.94 MIL/uL — ABNORMAL LOW (ref 3.80–5.20)
RDW: 14 % (ref 11.5–14.5)
WBC: 8.4 10*3/uL (ref 3.6–11.0)

## 2015-01-01 LAB — BASIC METABOLIC PANEL
Anion gap: 5 (ref 5–15)
BUN: 19 mg/dL (ref 6–20)
CO2: 25 mmol/L (ref 22–32)
CREATININE: 0.84 mg/dL (ref 0.44–1.00)
Calcium: 8.6 mg/dL — ABNORMAL LOW (ref 8.9–10.3)
Chloride: 107 mmol/L (ref 101–111)
GLUCOSE: 164 mg/dL — AB (ref 65–99)
POTASSIUM: 3.8 mmol/L (ref 3.5–5.1)
Sodium: 137 mmol/L (ref 135–145)

## 2015-01-01 LAB — HEMOGLOBIN
HEMOGLOBIN: 8.8 g/dL — AB (ref 12.0–16.0)
Hemoglobin: 9.1 g/dL — ABNORMAL LOW (ref 12.0–16.0)

## 2015-01-01 LAB — GLUCOSE, CAPILLARY
GLUCOSE-CAPILLARY: 153 mg/dL — AB (ref 65–99)
GLUCOSE-CAPILLARY: 126 mg/dL — AB (ref 65–99)
GLUCOSE-CAPILLARY: 132 mg/dL — AB (ref 65–99)
Glucose-Capillary: 179 mg/dL — ABNORMAL HIGH (ref 65–99)

## 2015-01-01 MED ORDER — TECHNETIUM TC 99M-LABELED RED BLOOD CELLS IV KIT: 21.3600 | PACK | Freq: Once | INTRAVENOUS | Status: AC | PRN

## 2015-01-01 NOTE — Progress Notes (Signed)
Pt was taken down to, neu med for scan bleeding alert oriented has picc line in place and iv fluidsat 100 hour.me

## 2015-01-01 NOTE — Progress Notes (Signed)
ANTIBIOTIC CONSULT NOTE - INITIAL  Pharmacy Consult for Cipro  Indication: colitis/chrons flare  Allergies  Allergen Reactions  . Advil [Ibuprofen] Rash    Patient Measurements: Height: 5\' 7"  (170.2 cm) Weight: 209 lb 5 oz (94.944 kg) IBW/kg (Calculated) : 61.6 Adjusted Body Weight:   Vital Signs: Temp: 98.4 F (36.9 C) (08/06 1200) Temp Source: Oral (08/06 1200) BP: 157/68 mmHg (08/06 1100) Pulse Rate: 71 (08/06 1100) Intake/Output from previous day: 08/05 0701 - 08/06 0700 In: 2251.7 [P.O.:200; I.V.:1751.7; IV Piggyback:300] Out: 4 [Urine:2; Stool:2] Intake/Output from this shift: Total I/O In: 800 [I.V.:600; IV Piggyback:200] Out: -   Labs:  Recent Labs  12/31/14 0500 12/31/14 0528  01/01/15 0030 01/01/15 0308 01/01/15 1157  WBC  --  5.2  --   --  8.4  --   HGB  --  12.1  < > 9.1* 9.1* 8.8*  PLT  --  166  --   --  157  --   CREATININE 1.17*  --   --   --  0.84  --   < > = values in this interval not displayed. Estimated Creatinine Clearance: 66.3 mL/min (by C-G formula based on Cr of 0.84). No results for input(s): VANCOTROUGH, VANCOPEAK, VANCORANDOM, GENTTROUGH, GENTPEAK, GENTRANDOM, TOBRATROUGH, TOBRAPEAK, TOBRARND, AMIKACINPEAK, AMIKACINTROU, AMIKACIN in the last 72 hours.   Microbiology: Recent Results (from the past 720 hour(s))  MRSA PCR Screening     Status: None   Collection Time: 12/31/14 10:10 AM  Result Value Ref Range Status   MRSA by PCR NEGATIVE NEGATIVE Final    Comment:        The GeneXpert MRSA Assay (FDA approved for NASAL specimens only), is one component of a comprehensive MRSA colonization surveillance program. It is not intended to diagnose MRSA infection nor to guide or monitor treatment for MRSA infections.   C difficile quick scan w PCR reflex     Status: None   Collection Time: 12/31/14 10:13 AM  Result Value Ref Range Status   C Diff antigen NEGATIVE NEGATIVE Final   C Diff toxin NEGATIVE NEGATIVE Final   C Diff  interpretation Negative for C. difficile  Final  Stool culture     Status: None (Preliminary result)   Collection Time: 12/31/14 10:15 AM  Result Value Ref Range Status   Specimen Description STOOL  Final   Special Requests NONE  Final   Culture   Final    NO SALMONELLA OR SHIGELLA ISOLATED No Pathogenic E. coli detected NO CAMPYLOBACTER DETECTED    Report Status PENDING  Incomplete    Medical History: Past Medical History  Diagnosis Date  . Crohn's disease   . Hypertension   . Diabetes mellitus without complication   . Hyperlipemia     Medications:  Scheduled:  . acidophilus  1 capsule Oral Daily  . amLODipine  10 mg Oral Daily  . atenolol  50 mg Oral Daily  . calcium-vitamin D  1 tablet Oral BID  . ciprofloxacin  400 mg Intravenous Q12H  . cloNIDine  0.3 mg Oral BID  . furosemide  40 mg Oral Daily  . insulin aspart  0-5 Units Subcutaneous QHS  . insulin aspart  0-9 Units Subcutaneous TID WC  . methylPREDNISolone (SOLU-MEDROL) injection  60 mg Intravenous Q12H  . metronidazole  500 mg Intravenous Q8H  . pantoprazole (PROTONIX) IV  40 mg Intravenous Q12H  . pravastatin  10 mg Oral q1800  . quinapril  40 mg Oral BID  Assessment: Pharmacy consulted to dose cipro in this 81 year old female being treated for colitis/chrons flare. Patient also on metronidazole.  Plan:  Will continue cipro 400 mg iv q12 hours.   Kaitlin Gomez C 01/01/2015,2:42 PM

## 2015-01-01 NOTE — Progress Notes (Addendum)
[facility] [place] NAME: Kaitlin Gomez    MR#:  [id number]  DATE OF BIRTH:  [birth year]  SUBJECTIVE:  Patient continues to have bloody diarrhea however this is much improved since yesterday. Patient was sent to the unit due to ongoing bloody diarrhea. Her hemoglobin is remained stable as is her blood pressure.  REVIEW OF SYSTEMS:    Review of Systems  Constitutional: Negative for fever, chills and malaise/fatigue.  HENT: Negative for sore throat.   Eyes: Negative for blurred vision.  Respiratory: Negative for cough, hemoptysis, shortness of breath and wheezing.   Cardiovascular: Positive for leg swelling. Negative for chest pain and palpitations.  Gastrointestinal: Positive for nausea, diarrhea and blood in stool. Negative for vomiting and abdominal pain.  Genitourinary: Negative for dysuria.  Musculoskeletal: Negative for back pain.  Neurological: Negative for dizziness, tremors and headaches.  Endo/Heme/Allergies: Does not bruise/bleed easily.    Tolerating Diet: Yes clear liquid diet      DRUG ALLERGIES:   Allergies  Allergen Reactions  . Advil [Ibuprofen] Rash    VITALS:  Blood pressure 157/68, pulse 71, temperature 97.9 F (36.6 C), temperature source Oral, resp. rate 15, height 5\' 7"  (1.702 m), weight 94.944 kg (209 lb 5 oz), SpO2 100 %.  PHYSICAL EXAMINATION:   Physical Exam  Constitutional: She is oriented to person, place, and time and well-developed, well-nourished, and in no distress. No distress.  HENT:  Head: Normocephalic.  Eyes: No scleral icterus.  Neck: Normal range of motion. Neck supple. No JVD present. No tracheal deviation present.  Cardiovascular: Normal rate, regular rhythm and normal heart sounds.  Exam reveals no gallop and no friction rub.   No murmur heard. Pulmonary/Chest: Effort normal and breath sounds normal. No respiratory distress. She has no wheezes. She has no rales. She exhibits  no tenderness.  Abdominal: Soft. Bowel sounds are normal. She exhibits no distension and no mass. There is no tenderness. There is no rebound and no guarding.  Musculoskeletal: Normal range of motion. She exhibits edema.  Neurological: She is alert and oriented to person, place, and time.  Skin: Skin is warm. No rash noted. No erythema.  Psychiatric: Affect and judgment normal.      LABORATORY PANEL:   CBC  Recent Labs Lab 01/01/15 0308  WBC 8.4  HGB 9.1*  HCT 26.6*  PLT 157   ------------------------------------------------------------------------------------------------------------------  Chemistries   Recent Labs Lab 12/31/14 0500 01/01/15 0308  NA 141 137  K 3.7 3.8  CL 108 107  CO2 23 25  GLUCOSE 108* 164*  BUN 21* 19  CREATININE 1.17* 0.84  CALCIUM 9.1 8.6*  AST 20  --   ALT 18  --   ALKPHOS 65  --   BILITOT 0.2*  --    ------------------------------------------------------------------------------------------------------------------  Cardiac Enzymes No results for input(s): TROPONINI in the last 168 hours. ------------------------------------------------------------------------------------------------------------------  RADIOLOGY:  Nm Gi Blood Loss  01/01/2015   IMPRESSION: No abnormal accumulation of activity seen to suggest active GI bleeding at this time. The recent CT demonstrated apparent evidence of sigmoid colitis.   Electronically Signed   By: Garald Balding M.D.   On: 01/01/2015 02:59   Ct Abdomen Pelvis W Contrast  12/31/2014   C.  IMPRESSION: 1. Prior left hemi colectomy with unremarkable anastomosis. 2. Abnormal mural thickening and mural enhancement in the distal 15 cm of colon, likely colitis or ischemia. 3. Left nephrolithiasis 4. Lumbar degenerative disc disease   Electronically  Signed   By: Andreas Newport M.D.   On: 12/31/2014 06:47   Dg Chest Port 1 View  01/01/2015   CLINICAL DATA:  PICC line adjustment  EXAM: PORTABLE CHEST - 1 VIEW   COMPARISON:  None.  FINDINGS: The PICC line has been advanced and now extends beyond the azygos vein junction, well into the SVC. No pneumothorax. Lungs are clear.  IMPRESSION: Satisfactorily positioned left upper extremity PICC line   Electronically Signed   By: Andreas Newport M.D.   On: 01/01/2015 01:57   Dg Chest Port 1 View  01/01/2015   CLINICAL DATA:  PICC line placement  EXAM: PORTABLE CHEST - 1 VIEW  COMPARISON:  None.  FINDINGS: There is a left upper extremity PICC line with tip in the expected location of the SVC near the azygos vein junction. The lungs are clear. There is no pneumothorax.  IMPRESSION: Satisfactorily positioned left upper extremity PICC line   Electronically Signed   By: Andreas Newport M.D.   On: 01/01/2015 01:55     ASSESSMENT AND PLAN:   80 year old female with history of Crohn's disease who presented with bloody diarrhea. 1. Crohn's flare: Patient has CT scan which showed sigmoid colitis. She likely has a Crohn's flare. GI bleeding scan was negative. She was seen and evaluated by GI and vascular surgery. Her hemoglobin remains stable. She will continue with IV steroids and IV antibiotics. According to vascular surgery consultation it is highly unlikely the patient has mesenteric ischemia based on her CT scan. C. difficile culture was negative and stool culture so far negative.  2. Hypertension: Patient should continue her outpatient medications including Norvasc, atenolol, clonidine and quinapril.  3. Diabetes: Patient will be on siding scale insulin area and metformin is on hold.  4. Lower extreme edema: Continue Lasix.  5. Hyperlipidemia: Continue Mevacor     Patient may be transferred to the floor.     Management plans discussed with the patient and sheis in agreement.  CODE STATUS: Full   TOTAL TIME TAKING CARE OF THIS PATIENT: 30 minutes     POSSIBLE D/C 2-3 days , DEPENDING ON CLINICAL CONDITION.   Kaitlin Gomez M.D on 01/01/2015 at 11:40  AM  Between 7am to 6pm - Pager - [phone] After 6pm go to [email or web address] - password [email or web address] [facility]  Office  [phone]  CC: Primary care physician; Marguerita Merles, MD

## 2015-01-01 NOTE — Consult Note (Signed)
Subjective: Patient seen for rectal bleeding in the setting of known Crohn's disease. One small amount of blood passed early this morning not recurrent. Abdominal pain is improving there is no nausea.  Objective: Vital signs in last 24 hours: Temp:  [97.9 F (36.6 C)-98.5 F (36.9 C)] 98.4 F (36.9 C) (08/06 1200) Pulse Rate:  [58-74] 71 (08/06 1100) Resp:  [0-24] 15 (08/06 1100) BP: (82-157)/(48-108) 157/68 mmHg (08/06 1100) SpO2:  [94 %-100 %] 100 % (08/06 1100) Blood pressure 157/68, pulse 71, temperature 98.4 F (36.9 C), temperature source Oral, resp. rate 15, height 5\' 7"  (1.702 m), weight 94.944 kg (209 lb 5 oz), SpO2 100 %.   Intake/Output from previous day: 08/05 0701 - 08/06 0700 In: 2251.7 [P.O.:200; I.V.:1751.7; IV Piggyback:300] Out: 4 [Urine:2; Stool:2]  Intake/Output this shift: Total I/O In: 800 [I.V.:600; IV Piggyback:200] Out: -    General appearance:  80 year old female no acute distress Resp:  Clear to auscultation Cardio:  Regular rate and rhythm without rub or gallop GI:  Soft mild discomfort across the lower abdomen or masses or rebound. Bowel sounds are positive normoactive Extremities:  Trace edema right lower extremity, 1+ edema left lower extremity   Lab Results: Results for orders placed or performed during the hospital encounter of 12/31/14 (from the past 24 hour(s))  Glucose, capillary     Status: Abnormal   Collection Time: 12/31/14  3:29 PM  Result Value Ref Range   Glucose-Capillary 118 (H) 65 - 99 mg/dL   Comment 1 Notify RN   Hemoglobin     Status: Abnormal   Collection Time: 12/31/14  6:53 PM  Result Value Ref Range   Hemoglobin 10.6 (L) 12.0 - 16.0 g/dL  Glucose, capillary     Status: Abnormal   Collection Time: 12/31/14 10:04 PM  Result Value Ref Range   Glucose-Capillary 147 (H) 65 - 99 mg/dL  Hemoglobin     Status: Abnormal   Collection Time: 01/01/15 12:30 AM  Result Value Ref Range   Hemoglobin 9.1 (L) 12.0 - 16.0 g/dL   Basic metabolic panel     Status: Abnormal   Collection Time: 01/01/15  3:08 AM  Result Value Ref Range   Sodium 137 135 - 145 mmol/L   Potassium 3.8 3.5 - 5.1 mmol/L   Chloride 107 101 - 111 mmol/L   CO2 25 22 - 32 mmol/L   Glucose, Bld 164 (H) 65 - 99 mg/dL   BUN 19 6 - 20 mg/dL   Creatinine, Ser 0.84 0.44 - 1.00 mg/dL   Calcium 8.6 (L) 8.9 - 10.3 mg/dL   GFR calc non Af Amer >60 >60 mL/min   GFR calc Af Amer >60 >60 mL/min   Anion gap 5 5 - 15  CBC     Status: Abnormal   Collection Time: 01/01/15  3:08 AM  Result Value Ref Range   WBC 8.4 3.6 - 11.0 K/uL   RBC 2.94 (L) 3.80 - 5.20 MIL/uL   Hemoglobin 9.1 (L) 12.0 - 16.0 g/dL   HCT 26.6 (L) 35.0 - 47.0 %   MCV 90.7 80.0 - 100.0 fL   MCH 30.9 26.0 - 34.0 pg   MCHC 34.1 32.0 - 36.0 g/dL   RDW 14.0 11.5 - 14.5 %   Platelets 157 150 - 440 K/uL  Glucose, capillary     Status: Abnormal   Collection Time: 01/01/15  6:59 AM  Result Value Ref Range   Glucose-Capillary 153 (H) 65 - 99 mg/dL  Glucose,  capillary     Status: Abnormal   Collection Time: 01/01/15 11:41 AM  Result Value Ref Range   Glucose-Capillary 179 (H) 65 - 99 mg/dL  Hemoglobin     Status: Abnormal   Collection Time: 01/01/15 11:57 AM  Result Value Ref Range   Hemoglobin 8.8 (L) 12.0 - 16.0 g/dL      Recent Labs  12/31/14 0528  01/01/15 0030 01/01/15 0308 01/01/15 1157  WBC 5.2  --   --  8.4  --   HGB 12.1  < > 9.1* 9.1* 8.8*  HCT 35.9  --   --  26.6*  --   PLT 166  --   --  157  --   < > = values in this interval not displayed. BMET  Recent Labs  12/31/14 0500 01/01/15 0308  NA 141 137  K 3.7 3.8  CL 108 107  CO2 23 25  GLUCOSE 108* 164*  BUN 21* 19  CREATININE 1.17* 0.84  CALCIUM 9.1 8.6*   LFT  Recent Labs  12/31/14 0500  PROT 7.0  ALBUMIN 4.1  AST 20  ALT 18  ALKPHOS 65  BILITOT 0.2*   PT/INR No results for input(s): LABPROT, INR in the last 72 hours. Hepatitis Panel No results for input(s): HEPBSAG, HCVAB, HEPAIGM,  HEPBIGM in the last 72 hours. C-Diff  Recent Labs  12/31/14 1013  CDIFFTOX NEGATIVE   No results for input(s): CDIFFPCR in the last 72 hours.   Studies/Results: Nm Gi Blood Loss  01/01/2015   CLINICAL DATA:  Acute drop in hemoglobin. Recurrent GI bleeding. Initial encounter.  EXAM: NUCLEAR MEDICINE GASTROINTESTINAL BLEEDING SCAN  TECHNIQUE: Sequential abdominal images were obtained following intravenous administration of Tc-11m labeled red blood cells.  RADIOPHARMACEUTICALS:  21.36 mCi Tc-52m in-vitro labeled red cells.  COMPARISON:  CT of the abdomen and pelvis performed earlier today at 6:30 a.m.  FINDINGS: No abnormal accumulation of activity is seen within the bowel. There is gradual accumulation of activity within the blood pool, bladder and liver. Trace apparent small bowel activity is thought to extend from the stomach.  IMPRESSION: No abnormal accumulation of activity seen to suggest active GI bleeding at this time. The recent CT demonstrated apparent evidence of sigmoid colitis.   Electronically Signed   By: Garald Balding M.D.   On: 01/01/2015 02:59   Ct Abdomen Pelvis W Contrast  12/31/2014   CLINICAL DATA:  Hematochezia.  EXAM: CT ABDOMEN AND PELVIS WITH CONTRAST  TECHNIQUE: Multidetector CT imaging of the abdomen and pelvis was performed using the standard protocol following bolus administration of intravenous contrast.  CONTRAST:  159mL OMNIPAQUE IOHEXOL 300 MG/ML  SOLN  COMPARISON:  None.  FINDINGS: There appears to be prior left hemi colectomy with unremarkable appearances of the anastomosis. There is abnormal mural thickening and mural enhancement in the distal 15 cm of colon. This could represent colitis or ischemia. There is no bowel obstruction. There is no extraluminal air.  There are normal appearances of the liver, gallbladder, bile ducts, spleen, pancreas, adrenals and kidneys except for left nephrolithiasis with at least 3 lower pole collecting system calculi measuring up to 6  mm. The abdominal aorta is normal in caliber. There is moderate atherosclerotic calcification. There is no adenopathy in the abdomen or pelvis. The stomach and small bowel appear unremarkable. There is no adenopathy. There is no ascites.  There is no significant skeletal lesion. There is moderately severe degenerative lumbar disc disease, greatest at L5-S1. There is minimal scarring  or atelectasis in the right lung base.  IMPRESSION: 1. Prior left hemi colectomy with unremarkable anastomosis. 2. Abnormal mural thickening and mural enhancement in the distal 15 cm of colon, likely colitis or ischemia. 3. Left nephrolithiasis 4. Lumbar degenerative disc disease   Electronically Signed   By: Andreas Newport M.D.   On: 12/31/2014 06:47   Dg Chest Port 1 View  01/01/2015   CLINICAL DATA:  PICC line adjustment  EXAM: PORTABLE CHEST - 1 VIEW  COMPARISON:  None.  FINDINGS: The PICC line has been advanced and now extends beyond the azygos vein junction, well into the SVC. No pneumothorax. Lungs are clear.  IMPRESSION: Satisfactorily positioned left upper extremity PICC line   Electronically Signed   By: Andreas Newport M.D.   On: 01/01/2015 01:57   Dg Chest Port 1 View  01/01/2015   CLINICAL DATA:  PICC line placement  EXAM: PORTABLE CHEST - 1 VIEW  COMPARISON:  None.  FINDINGS: There is a left upper extremity PICC line with tip in the expected location of the SVC near the azygos vein junction. The lungs are clear. There is no pneumothorax.  IMPRESSION: Satisfactorily positioned left upper extremity PICC line   Electronically Signed   By: Andreas Newport M.D.   On: 01/01/2015 01:55    Scheduled Inpatient Medications:   . acidophilus  1 capsule Oral Daily  . amLODipine  10 mg Oral Daily  . atenolol  50 mg Oral Daily  . calcium-vitamin D  1 tablet Oral BID  . ciprofloxacin  400 mg Intravenous Q12H  . cloNIDine  0.3 mg Oral BID  . furosemide  40 mg Oral Daily  . insulin aspart  0-5 Units Subcutaneous QHS   . insulin aspart  0-9 Units Subcutaneous TID WC  . methylPREDNISolone (SOLU-MEDROL) injection  60 mg Intravenous Q12H  . metronidazole  500 mg Intravenous Q8H  . pantoprazole (PROTONIX) IV  40 mg Intravenous Q12H  . pravastatin  10 mg Oral q1800  . quinapril  40 mg Oral BID    Continuous Inpatient Infusions:   . sodium chloride 100 mL/hr at 01/01/15 1208    PRN Inpatient Medications:  acetaminophen **OR** acetaminophen, alum & mag hydroxide-simeth, HYDROcodone-acetaminophen, ondansetron **OR** ondansetron (ZOFRAN) IV  Miscellaneous:   Assessment:  1. Hematochezia in the setting of known history of Crohn's disease. She also has a known history of diverticulosis and upper anal stenosis. sHe has had a partial left colectomy. The onset of her bleeding was acute and her Crohn's is apparently been relatively stable for quite some time. She has not had a repeat surgery in that regard since at least before her last colonoscopy in 2008. Patient has not been on a outpatient medication in regards to inflammatory bowel disease and indeed has been taking meloxicam could tend to worsen her situation. CT scan shows evidence of prior left hemicolectomy with interpretation of abnormal mural thickening and mural enhancement in the distal 15 cm of the colon could colitis versus ischemia. The ischemia would be unusual in this location and the thickening is most likely evidence of her Crohn's disease.  Currently hemodynamically stable, stable hemoglobin improved abdominal discomfort. On IV Cipro, Flagyl, Solu-Medrol.  Plan:  1. Continue current. If there is no further bleeding recommend completing course of anti-biotics and continuing a course of steroids for several weeks prior to colonoscopy. Recommend continuing current observation assure no further bleeding for at least 48 hours. Following  Lollie Sails MD 01/01/2015, 3:18 PM

## 2015-01-01 NOTE — Consult Note (Signed)
[facility] Vascular Consult Note  MRN : [id number]  Kaitlin Gomez is a 80 y.o. (Mar 16, 1935) female who presents with chief complaint of  Chief Complaint  Patient presents with  . Rectal Bleeding  .  History of Present Illness: is a 80 y.o. female with a known history of Crohn's disease, hypertension and diabetes who presents with above complaint. Since 3 AM patient's had for episodes of bloody diarrhea and abdominal pain. She is also having some nausea without vomiting. She denies eating anything abnormal. She denies fever and chills. She has a history of colon surgery in the past. From her Crohn's disease she has done well for several years now. She also has a history of diverticulosis. She does not have a history of peripheral arterial disease or prior chronic mesenteric ischemia.  I've also in consult by Dr. Jaquita Rector regarding persistent bleeding and the possibility of embolization.  Current Facility-Administered Medications  Medication Dose Route Frequency Provider Last Rate Last Dose  . 0.9 %  sodium chloride infusion   Intravenous Continuous Bettey Costa, MD 100 mL/hr at 01/01/15 1208    . acetaminophen (TYLENOL) tablet 650 mg  650 mg Oral Q6H PRN Bettey Costa, MD       Or  . acetaminophen (TYLENOL) suppository 650 mg  650 mg Rectal Q6H PRN Bettey Costa, MD      . acidophilus (RISAQUAD) capsule 1 capsule  1 capsule Oral Daily Bettey Costa, MD   1 capsule at 01/01/15 0959  . alum & mag hydroxide-simeth (MAALOX/MYLANTA) 200-200-20 MG/5ML suspension 30 mL  30 mL Oral Q6H PRN Sital Mody, MD      . amLODipine (NORVASC) tablet 10 mg  10 mg Oral Daily Bettey Costa, MD   10 mg at 01/01/15 1000  . atenolol (TENORMIN) tablet 50 mg  50 mg Oral Daily Bettey Costa, MD   50 mg at 01/01/15 1000  . calcium-vitamin D (OSCAL WITH D) 500-200 MG-UNIT per tablet 1 tablet  1 tablet Oral BID Bettey Costa, MD   1 tablet at 01/01/15 1003  . ciprofloxacin (CIPRO) IVPB 400 mg  400 mg Intravenous Q12H  Bettey Costa, MD   400 mg at 01/01/15 1151  . cloNIDine (CATAPRES) tablet 0.3 mg  0.3 mg Oral BID Bettey Costa, MD   0.3 mg at 01/01/15 0959  . furosemide (LASIX) tablet 40 mg  40 mg Oral Daily Bettey Costa, MD   40 mg at 01/01/15 1000  . HYDROcodone-acetaminophen (NORCO/VICODIN) 5-325 MG per tablet 1-2 tablet  1-2 tablet Oral Q4H PRN Bettey Costa, MD      . insulin aspart (novoLOG) injection 0-5 Units  0-5 Units Subcutaneous QHS Bettey Costa, MD   0 Units at 12/31/14 2318  . insulin aspart (novoLOG) injection 0-9 Units  0-9 Units Subcutaneous TID WC Bettey Costa, MD   0 Units at 12/31/14 1702  . methylPREDNISolone sodium succinate (SOLU-MEDROL) 125 mg/2 mL injection 60 mg  60 mg Intravenous Q12H Bettey Costa, MD   60 mg at 01/01/15 0311  . metroNIDAZOLE (FLAGYL) IVPB 500 mg  500 mg Intravenous Q8H Bettey Costa, MD   500 mg at 01/01/15 0627  . ondansetron (ZOFRAN) tablet 4 mg  4 mg Oral Q6H PRN Bettey Costa, MD       Or  . ondansetron (ZOFRAN) injection 4 mg  4 mg Intravenous Q6H PRN Sital Mody, MD      . pantoprazole (PROTONIX) injection 40 mg  40 mg Intravenous Q12H Bettey Costa, MD  40 mg at 01/01/15 1000  . pravastatin (PRAVACHOL) tablet 10 mg  10 mg Oral q1800 Bettey Costa, MD   10 mg at 12/31/14 2342  . quinapril (ACCUPRIL) tablet 40 mg  40 mg Oral BID Bettey Costa, MD   40 mg at 01/01/15 1000    Past Medical History  Diagnosis Date  . Crohn's disease   . Hypertension   . Diabetes mellitus without complication   . Hyperlipemia     History reviewed. No pertinent past surgical history.  Social History History  Substance Use Topics  . Smoking status: Never Smoker   . Smokeless tobacco: Not on file  . Alcohol Use: No    Family History History reviewed. No pertinent family history. no family history of porphyria or autoimmune disease  Allergies  Allergen Reactions  . Advil [Ibuprofen] Rash     REVIEW OF SYSTEMS (Negative unless checked)  Constitutional: [] Weight loss  [] Fever   [] Chills Cardiac: [] Chest pain   [] Chest pressure   [] Palpitations   [] Shortness of breath when laying flat   [] Shortness of breath at rest   [] Shortness of breath with exertion. Vascular:  [] Pain in legs with walking   [] Pain in legs at rest   [] Pain in legs when laying flat   [] Claudication   [] Pain in feet when walking  [] Pain in feet at rest  [] Pain in feet when laying flat   [] History of DVT   [] Phlebitis   [] Swelling in legs   [] Varicose veins   [] Non-healing ulcers Pulmonary:   [] Uses home oxygen   [] Productive cough   [] Hemoptysis   [] Wheeze  [] COPD   [] Asthma Neurologic:  [] Dizziness  [] Blackouts   [] Seizures   [] History of stroke   [] History of TIA  [] Aphasia   [] Temporary blindness   [] Dysphagia   [] Weakness or numbness in arms   [] Weakness or numbness in legs Musculoskeletal:  [] Arthritis   [] Joint swelling   [] Joint pain   [] Low back pain Hematologic:  [] Easy bruising  [] Easy bleeding   [] Hypercoagulable state   [] Anemic  [] Hepatitis Gastrointestinal:  [] Blood in stool   [] Vomiting blood  [] Gastroesophageal reflux/heartburn   [] Difficulty swallowing. Genitourinary:  [] Chronic kidney disease   [] Difficult urination  [] Frequent urination  [] Burning with urination   [] Blood in urine Skin:  [] Rashes   [] Ulcers   [] Wounds Psychological:  [] History of anxiety   []  History of major depression.    Physical Examination  Filed Vitals:   01/01/15 0900 01/01/15 1000 01/01/15 1100 01/01/15 1200  BP: 146/63 150/66 157/68   Pulse: 69 74 71   Temp:    98.4 F (36.9 C)  TempSrc:    Oral  Resp: 15 24 15    Height:      Weight:      SpO2: 100% 100% 100%    Body mass index is 32.78 kg/(m^2).  Head: [place]/AT, No temporalis wasting. Prominent temp pulse not noted. Ear/Nose/Throat: Nares w/o erythema or drainage, oropharynx w/o obsrtuction.  Eyes: PERRLA, Sclera nonicteric.  Neck: Supple, no nuchal rigidity.  No bruit or JVD.  Pulmonary:  Breath sounds equal bilaterally, no use of accessory  muscles.  Cardiac: RRR, normal S1, S2, no Murmurs, rubs or gallops. Vascular: She has trace palpable dorsalis pedis pulses nonpalpable posterior tibial pulses 1+ edema bilaterally Gastrointestinal: soft, non-tender, non-distended.  Musculoskeletal: Moves all extremities.  No deformity or atrophy. No edema. Neurologic: CN 2-12 intact. Symmetrical.  Speech is fluent.  Psychiatric: Judgment intact, Mood & affect appropriate for pt's  clinical situation. Dermatologic: No rashes or ulcers noted.  No cellulitis or open wounds. Lymph : No Cervical,  or Inguinal lymphadenopathy.      CBC Lab Results  Component Value Date   WBC 8.4 01/01/2015   HGB 8.8* 01/01/2015   HCT 26.6* 01/01/2015   MCV 90.7 01/01/2015   PLT 157 01/01/2015    BMET    Component Value Date/Time   NA 137 01/01/2015 0308   K 3.8 01/01/2015 0308   CL 107 01/01/2015 0308   CO2 25 01/01/2015 0308   GLUCOSE 164* 01/01/2015 0308   BUN 19 01/01/2015 0308   CREATININE 0.84 01/01/2015 0308   CALCIUM 8.6* 01/01/2015 0308   GFRNONAA >60 01/01/2015 0308   GFRAA >60 01/01/2015 0308   Estimated Creatinine Clearance: 66.3 mL/min (by C-G formula based on Cr of 0.84).  COAG No results found for: INR, PROTIME  Radiology CT scan of the abdomen and pelvis demonstrates good opacification of the aorta or is minimal atherosclerotic changes. 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 There is wide patency of the SMA and IMA   Assessment/Plan 1. Crohn's flare: Patient will be admitted to the hospital service. I have started empiric antibiotics including ciprofloxacin and Flagyl. Patient will also be started on IV steroids. GI has been consulted for further evaluation and management. We will continue to monitor her hemoglobin and transfuse if needed. Patient will be on a clear liquid diet for now. Further recommendations after GI consultation. I have also ordered C. difficile cultures as well as stool cultures.  I personally  reviewed her CT scan. Her SMA is widely patent. 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 IMA is also patent. Given this pattern it is highly unlikely that she has mesenteric ischemia based on low arterial inflow. Given her history of Crohn's and her past surgeries it is more likely that these are related to her presenting complaints.  At this time I do not anticipate angiography or intervention for mesenteric ischemia.  I've also discussed with Dr. Anastasio Champion the possibility of embolization if bleeding persists. However, given her underlying Crohn's disease as well as her previous surgical intervention of the: This would proceed place her at extremely high risk for necrosis of the colon and perforation. Given this parameter all attempts at conservative therapy should be made before this therapy was instituted. She is being transferred to the intensive care unit overnight for treatment and transfusion if needed.    2. Hypertension: Patient should continue her outpatient medications including Norvasc, atenolol, clonidine and quinapril.  3. Diabetes: Patient will be on siding scale insulin area and metformin will be held  4. Lower extreme edema: Continue Lasix.  5. Hyperlipidemia: Continue Mevacor   Gomez, Kaitlin Lory, MD  01/01/2015 2:24 PM

## 2015-01-02 LAB — PREPARE RBC (CROSSMATCH)

## 2015-01-02 LAB — IRON AND TIBC
Iron: 51 ug/dL (ref 28–170)
Saturation Ratios: 21 % (ref 10.4–31.8)
TIBC: 238 ug/dL — ABNORMAL LOW (ref 250–450)
UIBC: 187 ug/dL

## 2015-01-02 LAB — CBC
HCT: 23.4 % — ABNORMAL LOW (ref 35.0–47.0)
Hemoglobin: 8 g/dL — ABNORMAL LOW (ref 12.0–16.0)
MCH: 31 pg (ref 26.0–34.0)
MCHC: 34.4 g/dL (ref 32.0–36.0)
MCV: 89.9 fL (ref 80.0–100.0)
Platelets: 150 10*3/uL (ref 150–440)
RBC: 2.6 MIL/uL — ABNORMAL LOW (ref 3.80–5.20)
RDW: 13.7 % (ref 11.5–14.5)
WBC: 11.7 10*3/uL — ABNORMAL HIGH (ref 3.6–11.0)

## 2015-01-02 LAB — RETICULOCYTES
RBC.: 2.82 MIL/uL — AB (ref 3.80–5.20)
RETIC CT PCT: 2.5 % (ref 0.4–3.1)
Retic Count, Absolute: 70.5 10*3/uL (ref 19.0–183.0)

## 2015-01-02 LAB — BASIC METABOLIC PANEL
Anion gap: 4 — ABNORMAL LOW (ref 5–15)
BUN: 14 mg/dL (ref 6–20)
CHLORIDE: 110 mmol/L (ref 101–111)
CO2: 26 mmol/L (ref 22–32)
Calcium: 8 mg/dL — ABNORMAL LOW (ref 8.9–10.3)
Creatinine, Ser: 0.68 mg/dL (ref 0.44–1.00)
Glucose, Bld: 116 mg/dL — ABNORMAL HIGH (ref 65–99)
Potassium: 3.3 mmol/L — ABNORMAL LOW (ref 3.5–5.1)
Sodium: 140 mmol/L (ref 135–145)

## 2015-01-02 LAB — GLUCOSE, CAPILLARY
GLUCOSE-CAPILLARY: 140 mg/dL — AB (ref 65–99)
Glucose-Capillary: 139 mg/dL — ABNORMAL HIGH (ref 65–99)
Glucose-Capillary: 125 mg/dL — ABNORMAL HIGH (ref 65–99)
Glucose-Capillary: 178 mg/dL — ABNORMAL HIGH (ref 65–99)

## 2015-01-02 LAB — STOOL CULTURE

## 2015-01-02 LAB — FOLATE: FOLATE: 30 ng/mL (ref >5.9)

## 2015-01-02 LAB — VITAMIN B12: Vitamin B-12: 1878 pg/mL — ABNORMAL HIGH (ref 180–914)

## 2015-01-02 LAB — HEMOGLOBIN: Hemoglobin: 8.7 g/dL — ABNORMAL LOW (ref 12.0–16.0)

## 2015-01-02 LAB — FERRITIN: Ferritin: 35 ng/mL (ref 11–307)

## 2015-01-02 MED ORDER — ACETAMINOPHEN 325 MG PO TABS: 650.0000 mg | ORAL_TABLET | Freq: Once | ORAL | Status: DC

## 2015-01-02 MED ORDER — SODIUM CHLORIDE 0.9 % IV SOLN: Freq: Once | INTRAVENOUS | Status: DC

## 2015-01-02 NOTE — Consult Note (Signed)
Subjective: Patient seen for hematochezia in the setting of Crohn's disease. Patient denies any abdominal pain or nausea. She is tolerating a regular by mouth diet. Patient has had 3 episodes of a thin watery/bloody stool with brown stool in the past 24 hours.  Objective: Vital signs in last 24 hours: Temp:  [97.7 F (36.5 C)-98.2 F (36.8 C)] 98.2 F (36.8 C) (08/07 1115) Pulse Rate:  [62-73] 62 (08/07 1115) Resp:  [18-20] 18 (08/07 1115) BP: (138-159)/(48-70) 138/58 mmHg (08/07 1115) SpO2:  [99 %-100 %] 100 % (08/07 1115) Blood pressure 138/58, pulse 62, temperature 98.2 F (36.8 C), temperature source Oral, resp. rate 18, height 5\' 7"  (1.702 m), weight 94.944 kg (209 lb 5 oz), SpO2 100 %.   Intake/Output from previous day: 08/06 0701 - 08/07 0700 In: 6283 [P.O.:120; I.V.:1200; IV Piggyback:400] Out: 2350 [Urine:2350]  Intake/Output this shift:     General appearance:  Well-appearing no acute distress Resp:  Clear to auscultation Cardio:  Regular rate and rhythm GI:  Soft nontender nondistended bowel sounds positive normoactive Extremities:  Clubbing cyanosis or edema   Lab Results: Results for orders placed or performed during the hospital encounter of 12/31/14 (from the past 24 hour(s))  Glucose, capillary     Status: Abnormal   Collection Time: 01/01/15 10:01 PM  Result Value Ref Range   Glucose-Capillary 132 (H) 65 - 99 mg/dL  CBC     Status: Abnormal   Collection Time: 01/02/15  4:31 AM  Result Value Ref Range   WBC 11.7 (H) 3.6 - 11.0 K/uL   RBC 2.60 (L) 3.80 - 5.20 MIL/uL   Hemoglobin 8.0 (L) 12.0 - 16.0 g/dL   HCT 23.4 (L) 35.0 - 47.0 %   MCV 89.9 80.0 - 100.0 fL   MCH 31.0 26.0 - 34.0 pg   MCHC 34.4 32.0 - 36.0 g/dL   RDW 13.7 11.5 - 14.5 %   Platelets 150 150 - 440 K/uL  Basic metabolic panel     Status: Abnormal   Collection Time: 01/02/15  4:31 AM  Result Value Ref Range   Sodium 140 135 - 145 mmol/L   Potassium 3.3 (L) 3.5 - 5.1 mmol/L   Chloride  110 101 - 111 mmol/L   CO2 26 22 - 32 mmol/L   Glucose, Bld 116 (H) 65 - 99 mg/dL   BUN 14 6 - 20 mg/dL   Creatinine, Ser 0.68 0.44 - 1.00 mg/dL   Calcium 8.0 (L) 8.9 - 10.3 mg/dL   GFR calc non Af Amer >60 >60 mL/min   GFR calc Af Amer >60 >60 mL/min   Anion gap 4 (L) 5 - 15  Glucose, capillary     Status: Abnormal   Collection Time: 01/02/15  7:39 AM  Result Value Ref Range   Glucose-Capillary 139 (H) 65 - 99 mg/dL   Comment 1 Notify RN   Hemoglobin     Status: Abnormal   Collection Time: 01/02/15 10:30 AM  Result Value Ref Range   Hemoglobin 8.7 (L) 12.0 - 16.0 g/dL  Vitamin B12     Status: Abnormal   Collection Time: 01/02/15 10:30 AM  Result Value Ref Range   Vitamin B-12 1878 (H) 180 - 914 pg/mL  Folate     Status: None   Collection Time: 01/02/15 10:30 AM  Result Value Ref Range   Folate 30.0 >5.9 ng/mL  Iron and TIBC     Status: Abnormal   Collection Time: 01/02/15 10:30 AM  Result Value  Ref Range   Iron 51 28 - 170 ug/dL   TIBC 238 (L) 250 - 450 ug/dL   Saturation Ratios 21 10.4 - 31.8 %   UIBC 187 ug/dL  Ferritin     Status: None   Collection Time: 01/02/15 10:30 AM  Result Value Ref Range   Ferritin 35 11 - 307 ng/mL  Reticulocytes     Status: Abnormal   Collection Time: 01/02/15 10:30 AM  Result Value Ref Range   Retic Ct Pct 2.5 0.4 - 3.1 %   RBC. 2.82 (L) 3.80 - 5.20 MIL/uL   Retic Count, Manual 70.5 19.0 - 183.0 K/uL  Glucose, capillary     Status: Abnormal   Collection Time: 01/02/15 11:14 AM  Result Value Ref Range   Glucose-Capillary 140 (H) 65 - 99 mg/dL   Comment 1 Notify RN   Glucose, capillary     Status: Abnormal   Collection Time: 01/02/15  4:41 PM  Result Value Ref Range   Glucose-Capillary 125 (H) 65 - 99 mg/dL   Comment 1 Notify RN       Recent Labs  12/31/14 0528  01/01/15 0308 01/01/15 1157 01/02/15 0431 01/02/15 1030  WBC 5.2  --  8.4  --  11.7*  --   HGB 12.1  < > 9.1* 8.8* 8.0* 8.7*  HCT 35.9  --  26.6*  --  23.4*  --    PLT 166  --  157  --  150  --   < > = values in this interval not displayed. BMET  Recent Labs  12/31/14 0500 01/01/15 0308 01/02/15 0431  NA 141 137 140  K 3.7 3.8 3.3*  CL 108 107 110  CO2 23 25 26   GLUCOSE 108* 164* 116*  BUN 21* 19 14  CREATININE 1.17* 0.84 0.68  CALCIUM 9.1 8.6* 8.0*   LFT  Recent Labs  12/31/14 0500  PROT 7.0  ALBUMIN 4.1  AST 20  ALT 18  ALKPHOS 65  BILITOT 0.2*   PT/INR No results for input(s): LABPROT, INR in the last 72 hours. Hepatitis Panel No results for input(s): HEPBSAG, HCVAB, HEPAIGM, HEPBIGM in the last 72 hours. C-Diff  Recent Labs  12/31/14 1013  CDIFFTOX NEGATIVE   No results for input(s): CDIFFPCR in the last 72 hours.   Studies/Results: Nm Gi Blood Loss  01/01/2015   CLINICAL DATA:  Acute drop in hemoglobin. Recurrent GI bleeding. Initial encounter.  EXAM: NUCLEAR MEDICINE GASTROINTESTINAL BLEEDING SCAN  TECHNIQUE: Sequential abdominal images were obtained following intravenous administration of Tc-54m labeled red blood cells.  RADIOPHARMACEUTICALS:  21.36 mCi Tc-5m in-vitro labeled red cells.  COMPARISON:  CT of the abdomen and pelvis performed earlier today at 6:30 a.m.  FINDINGS: No abnormal accumulation of activity is seen within the bowel. There is gradual accumulation of activity within the blood pool, bladder and liver. Trace apparent small bowel activity is thought to extend from the stomach.  IMPRESSION: No abnormal accumulation of activity seen to suggest active GI bleeding at this time. The recent CT demonstrated apparent evidence of sigmoid colitis.   Electronically Signed   By: Garald Balding M.D.   On: 01/01/2015 02:59   Dg Chest Port 1 View  01/01/2015   CLINICAL DATA:  PICC line adjustment  EXAM: PORTABLE CHEST - 1 VIEW  COMPARISON:  None.  FINDINGS: The PICC line has been advanced and now extends beyond the azygos vein junction, well into the SVC. No pneumothorax. Lungs are clear.  IMPRESSION:  Satisfactorily  positioned left upper extremity PICC line   Electronically Signed   By: Andreas Newport M.D.   On: 01/01/2015 01:57   Dg Chest Port 1 View  01/01/2015   CLINICAL DATA:  PICC line placement  EXAM: PORTABLE CHEST - 1 VIEW  COMPARISON:  None.  FINDINGS: There is a left upper extremity PICC line with tip in the expected location of the SVC near the azygos vein junction. The lungs are clear. There is no pneumothorax.  IMPRESSION: Satisfactorily positioned left upper extremity PICC line   Electronically Signed   By: Andreas Newport M.D.   On: 01/01/2015 01:55    Scheduled Inpatient Medications:   . sodium chloride   Intravenous Once  . acetaminophen  650 mg Oral Once  . acidophilus  1 capsule Oral Daily  . amLODipine  10 mg Oral Daily  . atenolol  50 mg Oral Daily  . calcium-vitamin D  1 tablet Oral BID  . ciprofloxacin  400 mg Intravenous Q12H  . cloNIDine  0.3 mg Oral BID  . furosemide  40 mg Oral Daily  . insulin aspart  0-5 Units Subcutaneous QHS  . insulin aspart  0-9 Units Subcutaneous TID WC  . methylPREDNISolone (SOLU-MEDROL) injection  60 mg Intravenous Q12H  . metronidazole  500 mg Intravenous Q8H  . pantoprazole (PROTONIX) IV  40 mg Intravenous Q12H  . pravastatin  10 mg Oral q1800  . quinapril  40 mg Oral BID    Continuous Inpatient Infusions:   . sodium chloride 100 mL/hr at 01/02/15 0627    PRN Inpatient Medications:  acetaminophen **OR** acetaminophen, alum & mag hydroxide-simeth, HYDROcodone-acetaminophen, ondansetron **OR** ondansetron (ZOFRAN) IV  Miscellaneous:   Assessment:  1. Rectal bleeding. Patient does have a history of Crohn's disease and has had a partial colectomy for this prior to 2008. That was the time of her last colonoscopy. On digital rectal examination sHe does show a marked external at the proximal anal canal. Hemoglobin and hemodynamics have been stable have been stable.  Plan:  1. I would like to give her at least several more days of the  anti-biotics and steroids combination that she is currently on prior to considering luminal evaluation. However I may range for her to have her colonoscopy on Tuesday or Wednesday pending on scheduling.  Lollie Sails MD 01/02/2015, 8:26 PM

## 2015-01-02 NOTE — Progress Notes (Signed)
[place] at [place] NAME: Kaitlin Gomez    MR#:  [id number]  DATE OF BIRTH:  March 30, 1935  SUBJECTIVE:   Patient continues to have any bowel movements however less frequent than on admission. REVIEW OF SYSTEMS:    Review of Systems  Constitutional: Negative for fever, chills and malaise/fatigue.  HENT: Negative for sore throat.   Eyes: Negative for blurred vision.  Respiratory: Negative for cough, hemoptysis, shortness of breath and wheezing.   Cardiovascular: Positive for leg swelling. Negative for chest pain and palpitations.  Gastrointestinal: Positive for diarrhea and blood in stool. Negative for nausea, vomiting and abdominal pain.  Genitourinary: Negative for dysuria.  Musculoskeletal: Negative for back pain.  Neurological: Negative for dizziness, tremors and headaches.  Endo/Heme/Allergies: Does not bruise/bleed easily.    Tolerating Diet: Yes    DRUG ALLERGIES:   Allergies  Allergen Reactions  . Advil [Ibuprofen] Rash    VITALS:  Blood pressure 145/59, pulse 66, temperature 97.9 F (36.6 C), temperature source Oral, resp. rate 20, height 5\' 7"  (1.702 m), weight 94.944 kg (209 lb 5 oz), SpO2 100 %.  PHYSICAL EXAMINATION:   Physical Exam  Constitutional: She is oriented to person, place, and time and well-developed, well-nourished, and in no distress. No distress.  HENT:  Head: Normocephalic.  Eyes: No scleral icterus.  Neck: Normal range of motion. Neck supple. No JVD present. No tracheal deviation present.  Cardiovascular: Normal rate, regular rhythm and normal heart sounds.  Exam reveals no gallop and no friction rub.   No murmur heard. Pulmonary/Chest: Effort normal and breath sounds normal. No respiratory distress. She has no wheezes. She has no rales. She exhibits no tenderness.  Abdominal: Soft. Bowel sounds are normal. She exhibits no distension and no mass. There is no tenderness. There is no rebound and  no guarding.  Musculoskeletal: Normal range of motion. She exhibits edema.  Neurological: She is alert and oriented to person, place, and time.  Skin: Skin is warm. No rash noted. No erythema.  Psychiatric: Affect and judgment normal.      LABORATORY PANEL:   CBC  Recent Labs Lab 01/02/15 0431  WBC 11.7*  HGB 8.0*  HCT 23.4*  PLT 150   ------------------------------------------------------------------------------------------------------------------  Chemistries   Recent Labs Lab 12/31/14 0500  01/02/15 0431  NA 141  < > 140  K 3.7  < > 3.3*  CL 108  < > 110  CO2 23  < > 26  GLUCOSE 108*  < > 116*  BUN 21*  < > 14  CREATININE 1.17*  < > 0.68  CALCIUM 9.1  < > 8.0*  AST 20  --   --   ALT 18  --   --   ALKPHOS 65  --   --   BILITOT 0.2*  --   --   < > = values in this interval not displayed. ------------------------------------------------------------------------------------------------------------------  Cardiac Enzymes No results for input(s): TROPONINI in the last 168 hours. ------------------------------------------------------------------------------------------------------------------  RADIOLOGY:  Nm Gi Blood Loss  01/01/2015   IMPRESSION: No abnormal accumulation of activity seen to suggest active GI bleeding at this time. The recent CT demonstrated apparent evidence of sigmoid colitis.   Electronically Signed   By: Garald Balding M.D.   On: 01/01/2015 02:59   Ct Abdomen Pelvis W Contrast  12/31/2014   C.  IMPRESSION: 1. Prior left hemi colectomy with unremarkable anastomosis. 2. Abnormal mural thickening and mural enhancement in the distal  15 cm of colon, likely colitis or ischemia. 3. Left nephrolithiasis 4. Lumbar degenerative disc disease   Electronically Signed   By: Andreas Newport M.D.   On: 12/31/2014 06:47   Dg Chest Port 1 View  01/01/2015   CLINICAL DATA:  PICC line adjustment  EXAM: PORTABLE CHEST - 1 VIEW  COMPARISON:  None.  FINDINGS: The PICC  line has been advanced and now extends beyond the azygos vein junction, well into the SVC. No pneumothorax. Lungs are clear.  IMPRESSION: Satisfactorily positioned left upper extremity PICC line   Electronically Signed   By: Andreas Newport M.D.   On: 01/01/2015 01:57   Dg Chest Port 1 View  01/01/2015   CLINICAL DATA:  PICC line placement  EXAM: PORTABLE CHEST - 1 VIEW  COMPARISON:  None.  FINDINGS: There is a left upper extremity PICC line with tip in the expected location of the SVC near the azygos vein junction. The lungs are clear. There is no pneumothorax.  IMPRESSION: Satisfactorily positioned left upper extremity PICC line   Electronically Signed   By: Andreas Newport M.D.   On: 01/01/2015 01:55     ASSESSMENT AND PLAN:   80 year old female with history of Crohn's disease who presented with bloody diarrhea.  1. Crohn's flare: Patient has CT scan which showed sigmoid colitis. She likely has a Crohn's flare. GI bleeding scan was negative. She was seen and evaluated by GI and vascular surgery. Her hemoglobin remains stable. She will continue with IV steroids and IV antibiotics. According to vascular surgery consultation it is highly unlikely the patient has mesenteric ischemia based on her CT scan. C. difficile culture was negative and stool culture so far negative.  2. Hypertension: Patient should continue her outpatient medications including Norvasc, atenolol, clonidine and quinapril.  3. Diabetes: Patient will be on siding scale insulin area and metformin is on hold.  4. Lower extreme edema: Continue Lasix.  5. Hyperlipidemia: Continue Mevacor  6. Acute blood loss anemia: Patient's hemoglobin has dropped to 8.0. I will repeat it CBC at 11:00. She may need 1 unit of PRBCs. This was discussed and patient is consented for blood transfusion if needed. I would transfuse her if hemoglobin drops below 8 as she does continue to have bloody bowel movements.   Patient may be transferred to  the floor.     Management plans discussed with the patient and she is in agreement.  CODE STATUS: Full   TOTAL TIME TAKING CARE OF THIS PATIENT: 25 minutes     POSSIBLE D/C 2-3 days , DEPENDING ON CLINICAL CONDITION.   Antonia Culbertson M.D on 01/02/2015 at 10:08 AM  Between 7am to 6pm - Pager - [phone] After 6pm go to [email or web address] - password [email or web address] [facility]  Office  [phone]  CC: Primary care physician; Marguerita Merles, MD

## 2015-01-02 NOTE — Progress Notes (Signed)
Patient resting in bed at this time, denies any pain, family visited, pt continue to have black soft stool, HGB 8.7, pt stable diet advance to soft diet pt tolerated new diet will continue to monitor.

## 2015-01-03 LAB — CBC
HCT: 25.8 % — ABNORMAL LOW (ref 35.0–47.0)
Hemoglobin: 8.9 g/dL — ABNORMAL LOW (ref 12.0–16.0)
MCH: 31.1 pg (ref 26.0–34.0)
MCHC: 34.4 g/dL (ref 32.0–36.0)
MCV: 90.3 fL (ref 80.0–100.0)
Platelets: 172 10*3/uL (ref 150–440)
RBC: 2.86 MIL/uL — ABNORMAL LOW (ref 3.80–5.20)
RDW: 14.4 % (ref 11.5–14.5)
WBC: 14.7 10*3/uL — ABNORMAL HIGH (ref 3.6–11.0)

## 2015-01-03 LAB — GLUCOSE, CAPILLARY
GLUCOSE-CAPILLARY: 112 mg/dL — AB (ref 65–99)
Glucose-Capillary: 119 mg/dL — ABNORMAL HIGH (ref 65–99)
Glucose-Capillary: 112 mg/dL — ABNORMAL HIGH (ref 65–99)
Glucose-Capillary: 206 mg/dL — ABNORMAL HIGH (ref 65–99)

## 2015-01-03 LAB — BASIC METABOLIC PANEL
Anion gap: 5 (ref 5–15)
BUN: 19 mg/dL (ref 6–20)
CALCIUM: 8.6 mg/dL — AB (ref 8.9–10.3)
CO2: 29 mmol/L (ref 22–32)
Chloride: 106 mmol/L (ref 101–111)
Creatinine, Ser: 0.86 mg/dL (ref 0.44–1.00)
GFR calc Af Amer: >60 mL/min (ref >60)
GFR calc non Af Amer: >60 mL/min (ref >60)
GLUCOSE: 130 mg/dL — AB (ref 65–99)
Potassium: 3.4 mmol/L — ABNORMAL LOW (ref 3.5–5.1)
SODIUM: 140 mmol/L (ref 135–145)

## 2015-01-03 LAB — TYPE AND SCREEN
ABO/RH(D): A POS
Antibody Screen: NEGATIVE
UNIT DIVISION: 0

## 2015-01-03 MED ORDER — PRAVASTATIN SODIUM 20 MG PO TABS
40.0000 mg | ORAL_TABLET | Freq: Every day | ORAL | Status: DC
  Administered 2015-01-03 – 2015-01-06 (×4): 40 mg via ORAL
  Filled 2015-01-03 (×4): qty 2

## 2015-01-03 MED ORDER — POTASSIUM CHLORIDE CRYS ER 20 MEQ PO TBCR
40.0000 meq | EXTENDED_RELEASE_TABLET | Freq: Once | ORAL | Status: AC
  Administered 2015-01-03: 40 meq via ORAL
  Filled 2015-01-03: qty 2

## 2015-01-03 NOTE — Progress Notes (Signed)
[place] at [place] NAME: Kaitlin Gomez    MR#:  [id number]  DATE OF BIRTH:  Jan 10, 1935  SUBJECTIVE:  Patient continues to have loose bowel movements however less frequent than on admission. Feeling some stronger than before. Wants to get done with her c-scope while here. Hb 8.9 REVIEW OF SYSTEMS:   Review of Systems  Constitutional: Negative for fever, chills and malaise/fatigue.  HENT: Negative for sore throat.   Eyes: Negative for blurred vision.  Respiratory: Negative for cough, hemoptysis, shortness of breath and wheezing.   Cardiovascular: Negative for chest pain, palpitations and leg swelling.  Gastrointestinal: Positive for diarrhea. Negative for nausea, vomiting, abdominal pain and blood in stool.  Genitourinary: Negative for dysuria.  Musculoskeletal: Negative for back pain.  Neurological: Negative for dizziness, tremors and headaches.  Endo/Heme/Allergies: Does not bruise/bleed easily.    Tolerating Diet: Yes (Soft) DRUG ALLERGIES:   Allergies  Allergen Reactions  . Advil [Ibuprofen] Rash   VITALS:  Blood pressure 150/73, pulse 63, temperature 98 F (36.7 C), temperature source Oral, resp. rate 20, height 5\' 7"  (1.702 m), weight 94.944 kg (209 lb 5 oz), SpO2 100 %. PHYSICAL EXAMINATION:  Physical Exam  Constitutional: She is oriented to person, place, and time and well-developed, well-nourished, and in no distress. No distress.  HENT:  Head: Normocephalic.  Eyes: No scleral icterus.  Neck: Normal range of motion. Neck supple. No JVD present. No tracheal deviation present.  Cardiovascular: Normal rate, regular rhythm and normal heart sounds.  Exam reveals no gallop and no friction rub.   No murmur heard. Pulmonary/Chest: Effort normal and breath sounds normal. No respiratory distress. She has no wheezes. She has no rales. She exhibits no tenderness.  Abdominal: Soft. Bowel sounds are normal. She exhibits no  distension and no mass. There is no tenderness. There is no rebound and no guarding.  Musculoskeletal: Normal range of motion. She exhibits no edema.  Neurological: She is alert and oriented to person, place, and time.  Skin: Skin is warm. No rash noted. No erythema.  Psychiatric: Affect and judgment normal.   LABORATORY PANEL:   CBC  Recent Labs Lab 01/03/15 0459  WBC 14.7*  HGB 8.9*  HCT 25.8*  PLT 172   ------------------------------------------------------------------------------------------------------------------  Chemistries   Recent Labs Lab 12/31/14 0500  01/03/15 0459  NA 141  < > 140  K 3.7  < > 3.4*  CL 108  < > 106  CO2 23  < > 29  GLUCOSE 108*  < > 130*  BUN 21*  < > 19  CREATININE 1.17*  < > 0.86  CALCIUM 9.1  < > 8.6*  AST 20  --   --   ALT 18  --   --   ALKPHOS 65  --   --   BILITOT 0.2*  --   --   < > = values in this interval not displayed.  RADIOLOGY:  Nm Gi Blood Loss  01/01/2015   IMPRESSION: No abnormal accumulation of activity seen to suggest active GI bleeding at this time. The recent CT demonstrated apparent evidence of sigmoid colitis.   Electronically Signed   By: Garald Balding M.D.   On: 01/01/2015 02:59   Ct Abdomen Pelvis W Contrast  12/31/2014   C.  IMPRESSION: 1. Prior left hemi colectomy with unremarkable anastomosis. 2. Abnormal mural thickening and mural enhancement in the distal 15 cm of colon, likely colitis or ischemia. 3. Left nephrolithiasis  4. Lumbar degenerative disc disease   Electronically Signed   By: Andreas Newport M.D.   On: 12/31/2014 06:47   Dg Chest Port 1 View  01/01/2015   CLINICAL DATA:  PICC line adjustment  EXAM: PORTABLE CHEST - 1 VIEW  COMPARISON:  None.  FINDINGS: The PICC line has been advanced and now extends beyond the azygos vein junction, well into the SVC. No pneumothorax. Lungs are clear.  IMPRESSION: Satisfactorily positioned left upper extremity PICC line   Electronically Signed   By: Andreas Newport M.D.   On: 01/01/2015 01:57   Dg Chest Port 1 View  01/01/2015   CLINICAL DATA:  PICC line placement  EXAM: PORTABLE CHEST - 1 VIEW  COMPARISON:  None.  FINDINGS: There is a left upper extremity PICC line with tip in the expected location of the SVC near the azygos vein junction. The lungs are clear. There is no pneumothorax.  IMPRESSION: Satisfactorily positioned left upper extremity PICC line   Electronically Signed   By: Andreas Newport M.D.   On: 01/01/2015 01:55   ASSESSMENT AND PLAN:   80 year old female with history of Crohn's disease who presented with bloody diarrhea.  1. Crohn's flare: Continue with IV steroids and IV antibiotics. C. difficile culture was negative and stool culture so far negative. Discussed with Dr Gustavo Lah who is planning to do C-scope on Wednesday. Tolerating soft diet. H & H stable. No further bleeding.  2. Hypertension: Patient should continue her outpatient medications including Norvasc, atenolol, clonidine and quinapril.  3. Diabetes: Patient will be on siding scale insulin area and metformin is on hold.  4. Lower extreme edema: Continue Lasix.  5. Hyperlipidemia: Continue Mevacor  6. Acute blood loss anemia: Hb 8.9 and has been stable. No further bleeding.   Discontinue tele today  Management plans discussed with the patient and she is in agreement.  CODE STATUS: Full   TOTAL TIME TAKING CARE OF THIS PATIENT: 25 minutes   >50% time spent on counselling and coordination of care - Yes  POSSIBLE D/C 2-3 days , DEPENDING ON CLINICAL CONDITION AND GI W/UP   [facility], Mirabelle Cyphers M.D on 01/03/2015 at 8:39 AM  Between 7am to 6pm - Pager - [phone] After 6pm go to [email or web address] - password [email or web address] [facility]  Office  [phone]  CC: Primary care physician; Marguerita Merles, MD

## 2015-01-03 NOTE — Progress Notes (Signed)
Patient telemetry was removed as per order, remains alert and oriented, denies pain or discomfort, pt was transfer to room 203, report given to receiving nurse at this time.

## 2015-01-03 NOTE — Consult Note (Signed)
Subjective: Patient seen for rectal bleeding. No blood noted and stool was last night. His had 2 bowel movements today that were brown. She has some mild right lower quadrant discomfort. Nausea or abdominal pain. He is tolerating regular diet.  Objective: Vital signs in last 24 hours: Temp:  [97.7 F (36.5 C)-98.4 F (36.9 C)] 98.4 F (36.9 C) (08/08 1556) Pulse Rate:  [48-65] 57 (08/08 1556) Resp:  [17-20] 17 (08/08 1556) BP: (125-150)/(44-86) 125/44 mmHg (08/08 1556) SpO2:  [100 %] 100 % (08/08 1556) Blood pressure 125/44, pulse 57, temperature 98.4 F (36.9 C), temperature source Oral, resp. rate 17, height 5\' 7"  (1.702 m), weight 94.944 kg (209 lb 5 oz), SpO2 100 %.   Intake/Output from previous day: 08/07 0701 - 08/08 0700 In: 75 [P.O.:990; IV Piggyback:400] Out: 3400 [Urine:3400]  Intake/Output this shift: Total I/O In: 870 [P.O.:840; I.V.:30] Out: 601 [Urine:600; Stool:1]   General appearance:  A 80 year old female no acute distress Resp:  Clear to auscultation Cardio:  Regular rate and rhythm without rub or gallop GI:  Soft, mild discomfort to palpation in the right lower quadrant. There are no masses rebound or organomegaly. Extremities:  No clubbing cyanosis or edema   Lab Results: Results for orders placed or performed during the hospital encounter of 12/31/14 (from the past 24 hour(s))  Glucose, capillary     Status: Abnormal   Collection Time: 01/02/15  8:30 PM  Result Value Ref Range   Glucose-Capillary 178 (H) 65 - 99 mg/dL  CBC     Status: Abnormal   Collection Time: 01/03/15  4:59 AM  Result Value Ref Range   WBC 14.7 (H) 3.6 - 11.0 K/uL   RBC 2.86 (L) 3.80 - 5.20 MIL/uL   Hemoglobin 8.9 (L) 12.0 - 16.0 g/dL   HCT 25.8 (L) 35.0 - 47.0 %   MCV 90.3 80.0 - 100.0 fL   MCH 31.1 26.0 - 34.0 pg   MCHC 34.4 32.0 - 36.0 g/dL   RDW 14.4 11.5 - 14.5 %   Platelets 172 150 - 440 K/uL  Basic metabolic panel     Status: Abnormal   Collection Time: 01/03/15   4:59 AM  Result Value Ref Range   Sodium 140 135 - 145 mmol/L   Potassium 3.4 (L) 3.5 - 5.1 mmol/L   Chloride 106 101 - 111 mmol/L   CO2 29 22 - 32 mmol/L   Glucose, Bld 130 (H) 65 - 99 mg/dL   BUN 19 6 - 20 mg/dL   Creatinine, Ser 0.86 0.44 - 1.00 mg/dL   Calcium 8.6 (L) 8.9 - 10.3 mg/dL   GFR calc non Af Amer >60 >60 mL/min   GFR calc Af Amer >60 >60 mL/min   Anion gap 5 5 - 15  Glucose, capillary     Status: Abnormal   Collection Time: 01/03/15  7:38 AM  Result Value Ref Range   Glucose-Capillary 119 (H) 65 - 99 mg/dL   Comment 1 Notify RN   Glucose, capillary     Status: Abnormal   Collection Time: 01/03/15 11:15 AM  Result Value Ref Range   Glucose-Capillary 112 (H) 65 - 99 mg/dL   Comment 1 Notify RN   Glucose, capillary     Status: Abnormal   Collection Time: 01/03/15  4:22 PM  Result Value Ref Range   Glucose-Capillary 112 (H) 65 - 99 mg/dL   Comment 1 Notify RN       Recent Labs  01/01/15 0308  01/02/15  0431 01/02/15 1030 01/03/15 0459  WBC 8.4  --  11.7*  --  14.7*  HGB 9.1*  < > 8.0* 8.7* 8.9*  HCT 26.6*  --  23.4*  --  25.8*  PLT 157  --  150  --  172  < > = values in this interval not displayed. BMET  Recent Labs  01/01/15 0308 01/02/15 0431 01/03/15 0459  NA 137 140 140  K 3.8 3.3* 3.4*  CL 107 110 106  CO2 25 26 29   GLUCOSE 164* 116* 130*  BUN 19 14 19   CREATININE 0.84 0.68 0.86  CALCIUM 8.6* 8.0* 8.6*   LFT No results for input(s): PROT, ALBUMIN, AST, ALT, ALKPHOS, BILITOT, BILIDIR, IBILI in the last 72 hours. PT/INR No results for input(s): LABPROT, INR in the last 72 hours. Hepatitis Panel No results for input(s): HEPBSAG, HCVAB, HEPAIGM, HEPBIGM in the last 72 hours. C-Diff No results for input(s): CDIFFTOX in the last 72 hours. No results for input(s): CDIFFPCR in the last 72 hours.   Studies/Results: No results found.  Scheduled Inpatient Medications:   . sodium chloride   Intravenous Once  . acetaminophen  650 mg Oral  Once  . acidophilus  1 capsule Oral Daily  . amLODipine  10 mg Oral Daily  . atenolol  50 mg Oral Daily  . calcium-vitamin D  1 tablet Oral BID  . ciprofloxacin  400 mg Intravenous Q12H  . cloNIDine  0.3 mg Oral BID  . furosemide  40 mg Oral Daily  . insulin aspart  0-5 Units Subcutaneous QHS  . insulin aspart  0-9 Units Subcutaneous TID WC  . methylPREDNISolone (SOLU-MEDROL) injection  60 mg Intravenous Q12H  . metronidazole  500 mg Intravenous Q8H  . pantoprazole (PROTONIX) IV  40 mg Intravenous Q12H  . potassium chloride  40 mEq Oral Once  . pravastatin  40 mg Oral q1800  . quinapril  40 mg Oral BID    Continuous Inpatient Infusions:   . sodium chloride 100 mL/hr at 01/03/15 1145    PRN Inpatient Medications:  acetaminophen **OR** acetaminophen, alum & mag hydroxide-simeth, HYDROcodone-acetaminophen, ondansetron **OR** ondansetron (ZOFRAN) IV  Miscellaneous:   Assessment:  1. Rectal bleeding in the setting of known Crohn's disease. Last colonoscopy was in 2008. Currently her rectal bleeding is much improved on anti-biotics and steroids dosing.  Plan:  1. Continue current. I'm planning to a colonoscopy Wednesday afternoon. I'll switch her back to liquid starting this evening. Discussed with Dr. Manuella Ghazi.  Lollie Sails MD 01/03/2015, 5:10 PM

## 2015-01-04 LAB — GLUCOSE, CAPILLARY
GLUCOSE-CAPILLARY: 135 mg/dL — AB (ref 65–99)
GLUCOSE-CAPILLARY: 173 mg/dL — AB (ref 65–99)
GLUCOSE-CAPILLARY: 116 mg/dL — AB (ref 65–99)
Glucose-Capillary: 153 mg/dL — ABNORMAL HIGH (ref 65–99)

## 2015-01-04 LAB — BASIC METABOLIC PANEL
ANION GAP: 5 (ref 5–15)
BUN: 20 mg/dL (ref 6–20)
CHLORIDE: 105 mmol/L (ref 101–111)
CO2: 28 mmol/L (ref 22–32)
CREATININE: 1.01 mg/dL — AB (ref 0.44–1.00)
Calcium: 8.8 mg/dL — ABNORMAL LOW (ref 8.9–10.3)
GFR calc Af Amer: >60 mL/min (ref >60)
GFR, EST NON AFRICAN AMERICAN: 52 mL/min — AB (ref >60)
GLUCOSE: 152 mg/dL — AB (ref 65–99)
POTASSIUM: 3.8 mmol/L (ref 3.5–5.1)
Sodium: 138 mmol/L (ref 135–145)

## 2015-01-04 LAB — CBC
HCT: 29.3 % — ABNORMAL LOW (ref 35.0–47.0)
Hemoglobin: 9.9 g/dL — ABNORMAL LOW (ref 12.0–16.0)
MCH: 30.8 pg (ref 26.0–34.0)
MCHC: 33.8 g/dL (ref 32.0–36.0)
MCV: 91.1 fL (ref 80.0–100.0)
PLATELETS: 180 10*3/uL (ref 150–440)
RBC: 3.22 MIL/uL — ABNORMAL LOW (ref 3.80–5.20)
RDW: 14.4 % (ref 11.5–14.5)
WBC: 10 10*3/uL (ref 3.6–11.0)

## 2015-01-04 MED ORDER — PEG 3350-KCL-NA BICARB-NACL 420 G PO SOLR
4000.0000 mL | Freq: Once | ORAL | Status: AC
  Administered 2015-01-04: 4000 mL via ORAL
  Filled 2015-01-04: qty 4000

## 2015-01-04 NOTE — Progress Notes (Signed)
[facility] [place] NAME: Kaitlin Gomez    MR#:  [id number]  DATE OF BIRTH:  [birth year]  SUBJECTIVE:  Feeling better, Hb 9.9 this am. REVIEW OF SYSTEMS:   Review of Systems  Constitutional: Negative for fever, chills and malaise/fatigue.  HENT: Negative for sore throat.   Eyes: Negative for blurred vision.  Respiratory: Negative for cough, hemoptysis, shortness of breath and wheezing.   Cardiovascular: Negative for chest pain, palpitations and leg swelling.  Gastrointestinal: Negative for nausea, vomiting, abdominal pain, diarrhea and blood in stool.  Genitourinary: Negative for dysuria.  Musculoskeletal: Negative for back pain.  Neurological: Negative for dizziness, tremors and headaches.  Endo/Heme/Allergies: Does not bruise/bleed easily.    Tolerating Diet: Yes (Soft) DRUG ALLERGIES:   Allergies  Allergen Reactions  . Advil [Ibuprofen] Rash   VITALS:  Blood pressure 165/63, pulse 52, temperature 97.9 F (36.6 C), temperature source Oral, resp. rate 17, height 5\' 7"  (1.702 m), weight 94.944 kg (209 lb 5 oz), SpO2 100 %. PHYSICAL EXAMINATION:  Physical Exam  Constitutional: She is oriented to person, place, and time and well-developed, well-nourished, and in no distress. No distress.  HENT:  Head: Normocephalic.  Eyes: No scleral icterus.  Neck: Normal range of motion. Neck supple. No JVD present. No tracheal deviation present.  Cardiovascular: Normal rate, regular rhythm and normal heart sounds.  Exam reveals no gallop and no friction rub.   No murmur heard. Pulmonary/Chest: Effort normal and breath sounds normal. No respiratory distress. She has no wheezes. She has no rales. She exhibits no tenderness.  Abdominal: Soft. Bowel sounds are normal. She exhibits no distension and no mass. There is no tenderness. There is no rebound and no guarding.  Musculoskeletal: Normal range of motion. She exhibits no edema.   Neurological: She is alert and oriented to person, place, and time.  Skin: Skin is warm. No rash noted. No erythema.  Psychiatric: Affect and judgment normal.   LABORATORY PANEL:   CBC  Recent Labs Lab 01/04/15 0527  WBC 10.0  HGB 9.9*  HCT 29.3*  PLT 180   ------------------------------------------------------------------------------------------------------------------  Chemistries   Recent Labs Lab 12/31/14 0500  01/04/15 0527  NA 141  < > 138  K 3.7  < > 3.8  CL 108  < > 105  CO2 23  < > 28  GLUCOSE 108*  < > 152*  BUN 21*  < > 20  CREATININE 1.17*  < > 1.01*  CALCIUM 9.1  < > 8.8*  AST 20  --   --   ALT 18  --   --   ALKPHOS 65  --   --   BILITOT 0.2*  --   --   < > = values in this interval not displayed.  RADIOLOGY:  Nm Gi Blood Loss  01/01/2015   IMPRESSION: No abnormal accumulation of activity seen to suggest active GI bleeding at this time. The recent CT demonstrated apparent evidence of sigmoid colitis.   Electronically Signed   By: Garald Balding M.D.   On: 01/01/2015 02:59   Ct Abdomen Pelvis W Contrast  12/31/2014   C.  IMPRESSION: 1. Prior left hemi colectomy with unremarkable anastomosis. 2. Abnormal mural thickening and mural enhancement in the distal 15 cm of colon, likely colitis or ischemia. 3. Left nephrolithiasis 4. Lumbar degenerative disc disease   Electronically Signed   By: Andreas Newport M.D.   On: 12/31/2014 06:47   Dg Chest  Port 1 View  01/01/2015   CLINICAL DATA:  PICC line adjustment  EXAM: PORTABLE CHEST - 1 VIEW  COMPARISON:  None.  FINDINGS: The PICC line has been advanced and now extends beyond the azygos vein junction, well into the SVC. No pneumothorax. Lungs are clear.  IMPRESSION: Satisfactorily positioned left upper extremity PICC line   Electronically Signed   By: Andreas Newport M.D.   On: 01/01/2015 01:57   Dg Chest Port 1 View  01/01/2015   CLINICAL DATA:  PICC line placement  EXAM: PORTABLE CHEST - 1 VIEW  COMPARISON:   None.  FINDINGS: There is a left upper extremity PICC line with tip in the expected location of the SVC near the azygos vein junction. The lungs are clear. There is no pneumothorax.  IMPRESSION: Satisfactorily positioned left upper extremity PICC line   Electronically Signed   By: Andreas Newport M.D.   On: 01/01/2015 01:55   ASSESSMENT AND PLAN:   80 year old female with history of Crohn's disease who presented with bloody diarrhea.  1. Crohn's flare: Continue with IV steroids and IV antibiotics. C. difficile culture was negative and stool culture so far negative. Dr Gustavo Lah is planning to do C-scope on Wednesday. Tolerating diet. H & H stable. No further bleeding.  2. Hypertension: Patient should continue her outpatient medications including Norvasc, atenolol, clonidine and quinapril.  3. Diabetes: Patient will be on siding scale insulin area and metformin is on hold.  4. Lower extreme edema: Continue Lasix.  5. Hyperlipidemia: Continue Mevacor  6. Acute blood loss anemia: Hb 8.9 and has been stable. No further bleeding.   Discontinue tele today  Management plans discussed with the patient and she is in agreement.  CODE STATUS: Full   TOTAL TIME TAKING CARE OF THIS PATIENT: 25 minutes   >50% time spent on counselling and coordination of care - Yes  POSSIBLE D/C 1-2 days , DEPENDING ON CLINICAL CONDITION AND GI W/UP   [facility], Petina Muraski M.D on 01/04/2015 at 9:05 AM  Between 7am to 6pm - Pager - [phone] After 6pm go to [email or web address] - password [email or web address] [facility]  Office  [phone]  CC: Primary care physician; Marguerita Merles, MD

## 2015-01-04 NOTE — Care Management Important Message (Signed)
Important Message  Patient Details  Name: Kaitlin Gomez MRN: [id number] Date of Birth: 11-20-1934   Medicare Important Message Given:  Yes-second notification given    Darius Bump Allmond 01/04/2015, 8:54 AM

## 2015-01-04 NOTE — Consult Note (Signed)
Subjective: Patient seen for rectal bleeding in the setting of known history of Crohn's disease. Patient is doing well. She has been continued on anti-biotics and steroids. His had no rectal bleeding for about a day and a half to 2 days. Stools of been brown. They are small caliber. He has no nausea or abdominal pain.  Objective: Vital signs in last 24 hours: Temp:  [97.9 F (36.6 C)-98.2 F (36.8 C)] 98.2 F (36.8 C) (08/09 1608) Pulse Rate:  [51-52] 51 (08/09 1608) Resp:  [17-18] 18 (08/09 1608) BP: (127-165)/(41-63) 129/52 mmHg (08/09 1608) SpO2:  [98 %-100 %] 100 % (08/09 1608) Blood pressure 129/52, pulse 51, temperature 98.2 F (36.8 C), temperature source Oral, resp. rate 18, height 5\' 7"  (1.702 m), weight 94.944 kg (209 lb 5 oz), SpO2 100 %.   Intake/Output from previous day: 08/08 0701 - 08/09 0700 In: 3426 [P.O.:1080; I.V.:2346] Out: 2201 [Urine:2200; Stool:1]  Intake/Output this shift: Total I/O In: 966 [I.V.:966] Out: 400 [Urine:400]   General appearance:  80 year old female no acute distress Resp:  Bilaterally clear to auscultation Cardio:  Regular rate and rhythm without rub or gallop GI:  Soft nontender nondistended bowel sounds positive normoactive Extremities:  No clubbing cyanosis or edema   Lab Results: Results for orders placed or performed during the hospital encounter of 12/31/14 (from the past 24 hour(s))  Glucose, capillary     Status: Abnormal   Collection Time: 01/03/15  9:16 PM  Result Value Ref Range   Glucose-Capillary 206 (H) 65 - 99 mg/dL  CBC     Status: Abnormal   Collection Time: 01/04/15  5:27 AM  Result Value Ref Range   WBC 10.0 3.6 - 11.0 K/uL   RBC 3.22 (L) 3.80 - 5.20 MIL/uL   Hemoglobin 9.9 (L) 12.0 - 16.0 g/dL   HCT 29.3 (L) 35.0 - 47.0 %   MCV 91.1 80.0 - 100.0 fL   MCH 30.8 26.0 - 34.0 pg   MCHC 33.8 32.0 - 36.0 g/dL   RDW 14.4 11.5 - 14.5 %   Platelets 180 150 - 440 K/uL  Basic metabolic panel     Status: Abnormal   Collection Time: 01/04/15  5:27 AM  Result Value Ref Range   Sodium 138 135 - 145 mmol/L   Potassium 3.8 3.5 - 5.1 mmol/L   Chloride 105 101 - 111 mmol/L   CO2 28 22 - 32 mmol/L   Glucose, Bld 152 (H) 65 - 99 mg/dL   BUN 20 6 - 20 mg/dL   Creatinine, Ser 1.01 (H) 0.44 - 1.00 mg/dL   Calcium 8.8 (L) 8.9 - 10.3 mg/dL   GFR calc non Af Amer 52 (L) >60 mL/min   GFR calc Af Amer >60 >60 mL/min   Anion gap 5 5 - 15  Glucose, capillary     Status: Abnormal   Collection Time: 01/04/15  7:39 AM  Result Value Ref Range   Glucose-Capillary 135 (H) 65 - 99 mg/dL   Comment 1 Notify RN   Glucose, capillary     Status: Abnormal   Collection Time: 01/04/15 11:30 AM  Result Value Ref Range   Glucose-Capillary 173 (H) 65 - 99 mg/dL   Comment 1 Notify RN   Glucose, capillary     Status: Abnormal   Collection Time: 01/04/15  4:38 PM  Result Value Ref Range   Glucose-Capillary 116 (H) 65 - 99 mg/dL   Comment 1 Notify RN       Recent Labs  01/02/15 0431 01/02/15 1030 01/03/15 0459 01/04/15 0527  WBC 11.7*  --  14.7* 10.0  HGB 8.0* 8.7* 8.9* 9.9*  HCT 23.4*  --  25.8* 29.3*  PLT 150  --  172 180   BMET  Recent Labs  01/02/15 0431 01/03/15 0459 01/04/15 0527  NA 140 140 138  K 3.3* 3.4* 3.8  CL 110 106 105  CO2 26 29 28   GLUCOSE 116* 130* 152*  BUN 14 19 20   CREATININE 0.68 0.86 1.01*  CALCIUM 8.0* 8.6* 8.8*   LFT No results for input(s): PROT, ALBUMIN, AST, ALT, ALKPHOS, BILITOT, BILIDIR, IBILI in the last 72 hours. PT/INR No results for input(s): LABPROT, INR in the last 72 hours. Hepatitis Panel No results for input(s): HEPBSAG, HCVAB, HEPAIGM, HEPBIGM in the last 72 hours. C-Diff No results for input(s): CDIFFTOX in the last 72 hours. No results for input(s): CDIFFPCR in the last 72 hours.   Studies/Results: No results found.  Scheduled Inpatient Medications:   . sodium chloride   Intravenous Once  . acetaminophen  650 mg Oral Once  . acidophilus  1 capsule  Oral Daily  . amLODipine  10 mg Oral Daily  . atenolol  50 mg Oral Daily  . calcium-vitamin D  1 tablet Oral BID  . ciprofloxacin  400 mg Intravenous Q12H  . cloNIDine  0.3 mg Oral BID  . furosemide  40 mg Oral Daily  . insulin aspart  0-5 Units Subcutaneous QHS  . insulin aspart  0-9 Units Subcutaneous TID WC  . methylPREDNISolone (SOLU-MEDROL) injection  60 mg Intravenous Q12H  . metronidazole  500 mg Intravenous Q8H  . pantoprazole (PROTONIX) IV  40 mg Intravenous Q12H  . pravastatin  40 mg Oral q1800  . quinapril  40 mg Oral BID    Continuous Inpatient Infusions:   . sodium chloride 100 mL/hr at 01/04/15 1238    PRN Inpatient Medications:  acetaminophen **OR** acetaminophen, alum & mag hydroxide-simeth, HYDROcodone-acetaminophen, ondansetron **OR** ondansetron (ZOFRAN) IV  Miscellaneous:   Assessment:  1. Hematochezia in the setting of known history of Crohn's disease and diverticulosis. 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 sHe has a history of this.  Plan:  1. Colonoscopy. I have discussed the risks benefits and complications of procedures to include not limited to bleeding, infection, perforation and the risk of sedation and the patient wishes to proceed. We'll use a split prep. Continue current occasions. Procedure will be done tomorrow afternoon.  Lollie Sails MD 01/04/2015, 5:51 PM

## 2015-01-04 NOTE — Care Management (Signed)
Spoke with patient who is Alert oriented and independent from home, drives self, can afford medications. States lives with adult son. PCP is Dr Lennox Grumbles. No needs identified.

## 2015-01-05 ENCOUNTER — Inpatient Hospital Stay: Payer: Medicare Other | Admitting: Anesthesiology

## 2015-01-05 ENCOUNTER — Encounter: Payer: Self-pay | Admitting: *Deleted

## 2015-01-05 ENCOUNTER — Encounter
Admission: EM | Disposition: A | Discharge status: Home / Self Care | Payer: Self-pay | Source: Home / Self Care | Attending: Internal Medicine

## 2015-01-05 HISTORY — PX: COLONOSCOPY WITH PROPOFOL: SHX5780

## 2015-01-05 LAB — CBC WITH DIFFERENTIAL/PLATELET
BASOS PCT: 0 %
Basophils Absolute: 0 10*3/uL (ref 0–0.1)
EOS PCT: 0 %
Eosinophils Absolute: 0 10*3/uL (ref 0–0.7)
HCT: 30.9 % — ABNORMAL LOW (ref 35.0–47.0)
Hemoglobin: 10.5 g/dL — ABNORMAL LOW (ref 12.0–16.0)
LYMPHS PCT: 6 %
Lymphs Abs: 0.8 10*3/uL — ABNORMAL LOW (ref 1.0–3.6)
MCH: 30.5 pg (ref 26.0–34.0)
MCHC: 33.8 g/dL (ref 32.0–36.0)
MCV: 90.3 fL (ref 80.0–100.0)
Monocytes Absolute: 0.4 10*3/uL (ref 0.2–0.9)
Monocytes Relative: 3 %
Neutro Abs: 11.4 10*3/uL — ABNORMAL HIGH (ref 1.4–6.5)
Neutrophils Relative %: 91 %
Platelets: 216 10*3/uL (ref 150–440)
RBC: 3.43 MIL/uL — AB (ref 3.80–5.20)
RDW: 14.3 % (ref 11.5–14.5)
WBC: 12.6 10*3/uL — ABNORMAL HIGH (ref 3.6–11.0)

## 2015-01-05 LAB — GLUCOSE, CAPILLARY
GLUCOSE-CAPILLARY: 157 mg/dL — AB (ref 65–99)
GLUCOSE-CAPILLARY: 166 mg/dL — AB (ref 65–99)
GLUCOSE-CAPILLARY: 110 mg/dL — AB (ref 65–99)
Glucose-Capillary: 127 mg/dL — ABNORMAL HIGH (ref 65–99)
Glucose-Capillary: 130 mg/dL — ABNORMAL HIGH (ref 65–99)

## 2015-01-05 LAB — PROTIME-INR
INR: 1.33
Prothrombin Time: 16.7 seconds — ABNORMAL HIGH (ref 11.4–15.0)

## 2015-01-05 SURGERY — COLONOSCOPY WITH PROPOFOL
Anesthesia: General

## 2015-01-05 MED ORDER — MIDAZOLAM HCL 2 MG/2ML IJ SOLN
INTRAMUSCULAR | Status: DC | PRN
  Administered 2015-01-05: 1 mg via INTRAVENOUS

## 2015-01-05 MED ORDER — METHYLPREDNISOLONE SODIUM SUCC 40 MG IJ SOLR
40.0000 mg | Freq: Two times a day (BID) | INTRAMUSCULAR | Status: DC
  Administered 2015-01-06 – 2015-01-07 (×3): 40 mg via INTRAVENOUS
  Filled 2015-01-05 (×3): qty 1

## 2015-01-05 MED ORDER — PROPOFOL 10 MG/ML IV BOLUS
INTRAVENOUS | Status: DC | PRN
  Administered 2015-01-05: 40 mg via INTRAVENOUS

## 2015-01-05 MED ORDER — ASPIRIN 81 MG PO CHEW
81.0000 mg | CHEWABLE_TABLET | Freq: Every day | ORAL | Status: DC
  Administered 2015-01-05 – 2015-01-07 (×3): 81 mg via ORAL
  Filled 2015-01-05 (×3): qty 1

## 2015-01-05 MED ORDER — PROPOFOL INFUSION 10 MG/ML OPTIME
INTRAVENOUS | Status: DC | PRN
  Administered 2015-01-05: 140 ug/kg/min via INTRAVENOUS

## 2015-01-05 MED ORDER — GLYCOPYRROLATE 0.2 MG/ML IJ SOLN
INTRAMUSCULAR | Status: DC | PRN
  Administered 2015-01-05: 0.2 mg via INTRAVENOUS

## 2015-01-05 MED ORDER — MESALAMINE 1.2 G PO TBEC
4.8000 g | DELAYED_RELEASE_TABLET | Freq: Every day | ORAL | Status: DC
Start: 2015-01-06 — End: 2015-01-07
  Administered 2015-01-06: 4.8 g via ORAL
  Filled 2015-01-05 (×3): qty 4

## 2015-01-05 NOTE — Op Note (Signed)
[facility] Patient Name: Kaitlin Gomez Procedure Date: 01/05/2015 1:14 PM MRN: [id number] Account #: [id number] Date of Birth: [birth year] Admit Type: Inpatient Age: 80 Room: [facility] ENDO ROOM 1 Gender: Female Note Status: Finalized Procedure:         Colonoscopy Indications:       Hematochezia, Crohn's disease of the colon Providers:         Lollie Sails, MD Referring MD:      Marguerita Merles, MD (Referring MD) Medicines:         Monitored Anesthesia Care Complications:     No immediate complications. Procedure:         Pre-Anesthesia Assessment:                    - Monitored anesthesia is medically necessary due to                     previous failure of concious sedation.                    After obtaining informed consent, the colonoscope was                     passed under direct vision. Throughout the procedure, the                     patient's blood pressure, pulse, and oxygen saturations                     were monitored continuously. The Colonoscope was                     introduced through the anus and advanced to the the                     terminal ileum. The colonoscopy was performed without                     difficulty. The patient tolerated the procedure well. Findings:      The digital rectal exam findings include anal stricture, like a mucosal       ring at the upper end of the anal canal. This admitted the scope with       minimal maneuver.      A benign-appearing, intrinsic mild stenosis measuring 16 cm (in length)       x 1.1 cm (inner diameter) was found at 10 cm proximal to the anus and       was traversed.      Multiple small and large-mouthed diverticula were found in the proximal       sigmoid colon, in the descending colon, in the transverse colon, in the       ascending colon and in the cecum.      A 2 mm polyp was found in the ascending colon. The polyp was sessile.       The polyp was removed with a cold  biopsy forceps. Resection and       retrieval were complete.      The terminal ileum appeared normal.      Patchy mild inflammation characterized by erythema was found at the       distal ileocecal valve. Biopsies were taken with a cold forceps for       histology.      Biopsies for histology were taken with  a cold forceps from the cecum,       ascending colon, transverse colon, descending colon and rectum for       evaluation of microscopic colitis. Further biopsies werre taken from the       proximal and distal ends of the stenotic area (10 cm and 26 cm from the       anal verge) and in the center norrowest area about 16 cm from the anal       verge.      - the rectal vault was otherwise normal.      -it was noted on removal of the scope that the mucosal like ring in the       anal canal was opened with passage of the scope Impression:        - Anal stricture found on digital rectal exam.                    - Stricture at 10 cm proximal to the anus.                    - Diverticulosis in the proximal sigmoid colon, in the                     descending colon, in the transverse colon, in the                     ascending colon and in the cecum.                    - One 2 mm polyp in the ascending colon. Resected and                     retrieved.                    - The examined portion of the ileum was normal.                    - Patchy mild inflammation was found at the ileocecal                     valve. Biopsied.                    - Biopsies were taken with a cold forceps from the cecum,                     ascending colon, transverse colon, descending colon and                     rectum for evaluation of microscopic colitis. Recommendation:    1) finish 10 day course of antibiotic 2) change iv                     steriooid to prednisone 40 mg daily. 3) followup o/p GI                     clinic in 2 weeks to begin taper. 4) start lialda 4.8 gm                     po daily 5)  GI outpatient fu in 2 weeks                    - Return patient to hospital  [facility] for ongoing care.                    - Await pathology results. Procedure Code(s): --- Professional ---                    [id number], Colonoscopy, flexible; with biopsy, single or                     multiple Diagnosis Code(s): --- Professional ---                    569.2, Stenosis of rectum and anus                    560.9, Unspecified intestinal obstruction                    211.3, Benign neoplasm of colon                    558.9, Other and unspecified noninfectious gastroenteritis                     and colitis                    578.1, Blood in stool                    555.1, Regional enteritis of large intestine                    562.10, Diverticulosis of colon (without mention of                     hemorrhage) CPT copyright 2014 [employer]. All rights reserved. The codes documented in this report are preliminary and upon coder review may  be revised to meet current compliance requirements. Lollie Sails, MD 01/05/2015 2:38:26 PM This report has been signed electronically. Number of Addenda: 0 Note Initiated On: 01/05/2015 1:14 PM Scope Withdrawal Time: 0 hours 26 minutes 22 seconds  Total Procedure Duration: 0 hours 30 minutes 3 seconds       [facility]

## 2015-01-05 NOTE — Consult Note (Addendum)
Please see Colonoscopy report for recommendations.  Discussed with Dr Carlynn Spry.  Dr Vira Agar to cover tomorrow. Call if needed.

## 2015-01-05 NOTE — Progress Notes (Signed)
ANTIBIOTIC CONSULT NOTE - INITIAL  Pharmacy Consult for Cipro  Indication: colitis/chrons flare  Allergies  Allergen Reactions  . Advil [Ibuprofen] Rash    Patient Measurements: Height: 5\' 7"  (170.2 cm) Weight: 209 lb 5 oz (94.944 kg) IBW/kg (Calculated) : 61.6 Adjusted Body Weight:   Vital Signs: Temp: 98.6 F (37 C) (08/10 0810) Temp Source: Oral (08/10 0810) BP: 151/58 mmHg (08/10 0810) Pulse Rate: 48 (08/10 0810) Intake/Output from previous day: 08/09 0701 - 08/10 0700 In: 1635 [I.V.:1635] Out: 400 [Urine:400] Intake/Output from this shift:    Labs:  Recent Labs  01/03/15 0459 01/04/15 0527 01/05/15 0652  WBC 14.7* 10.0 12.6*  HGB 8.9* 9.9* 10.5*  PLT 172 180 216  CREATININE 0.86 1.01*  --    Estimated Creatinine Clearance: 55.2 mL/min (by C-G formula based on Cr of 1.01). No results for input(s): VANCOTROUGH, VANCOPEAK, VANCORANDOM, GENTTROUGH, GENTPEAK, GENTRANDOM, TOBRATROUGH, TOBRAPEAK, TOBRARND, AMIKACINPEAK, AMIKACINTROU, AMIKACIN in the last 72 hours.   Microbiology: Recent Results (from the past 720 hour(s))  MRSA PCR Screening     Status: None   Collection Time: 12/31/14 10:10 AM  Result Value Ref Range Status   MRSA by PCR NEGATIVE NEGATIVE Final    Comment:        The GeneXpert MRSA Assay (FDA approved for NASAL specimens only), is one component of a comprehensive MRSA colonization surveillance program. It is not intended to diagnose MRSA infection nor to guide or monitor treatment for MRSA infections.   C difficile quick scan w PCR reflex     Status: None   Collection Time: 12/31/14 10:13 AM  Result Value Ref Range Status   C Diff antigen NEGATIVE NEGATIVE Final   C Diff toxin NEGATIVE NEGATIVE Final   C Diff interpretation Negative for C. difficile  Final  Stool culture     Status: None   Collection Time: 12/31/14 10:15 AM  Result Value Ref Range Status   Specimen Description STOOL  Final   Special Requests NONE  Final   Culture    Final    NO SALMONELLA OR SHIGELLA ISOLATED No Pathogenic E. coli detected NO CAMPYLOBACTER DETECTED    Report Status 01/02/2015 FINAL  Final    Medical History: Past Medical History  Diagnosis Date  . Crohn's disease   . Hypertension   . Diabetes mellitus without complication   . Hyperlipemia     Medications:  Scheduled:  . acetaminophen  650 mg Oral Once  . acidophilus  1 capsule Oral Daily  . amLODipine  10 mg Oral Daily  . atenolol  50 mg Oral Daily  . calcium-vitamin D  1 tablet Oral BID  . ciprofloxacin  400 mg Intravenous Q12H  . cloNIDine  0.3 mg Oral BID  . furosemide  40 mg Oral Daily  . insulin aspart  0-5 Units Subcutaneous QHS  . insulin aspart  0-9 Units Subcutaneous TID WC  . methylPREDNISolone (SOLU-MEDROL) injection  60 mg Intravenous Q12H  . metronidazole  500 mg Intravenous Q8H  . pantoprazole (PROTONIX) IV  40 mg Intravenous Q12H  . pravastatin  40 mg Oral q1800  . quinapril  40 mg Oral BID   Assessment: Pharmacy consulted to dose cipro in this 80 year old female being treated for colitis/chrons flare. Patient also on metronidazole. Pt going for colonoscopy this afternoon.   Plan:  Will continue cipro 400 mg iv q12 hours.   Jkwon Treptow D Laurenashley Viar 01/05/2015,8:49 AM

## 2015-01-05 NOTE — Anesthesia Postprocedure Evaluation (Signed)
  Anesthesia Post-op Note  Patient: Kaitlin Gomez  Procedure(s) Performed: Procedure(s): COLONOSCOPY WITH PROPOFOL (N/A)  Anesthesia type:General  Patient location: PACU  Post pain: Pain level controlled  Post assessment: Post-op Vital signs reviewed, Patient's Cardiovascular Status Stable, Respiratory Function Stable, Patent Airway and No signs of Nausea or vomiting  Post vital signs: Reviewed and stable  Last Vitals:  Filed Vitals:   01/05/15 1525  BP: 146/70  Pulse: 48  Temp: 36.8 C  Resp: 17    Level of consciousness: awake, alert  and patient cooperative  Complications: No apparent anesthesia complications

## 2015-01-05 NOTE — Consult Note (Signed)
Subjective: Patient seen for rectal bleeding in the setting Crohn's disease. He has done well since her hospitalization last Friday. She has had no abdominal pain or nausea. She tolerated her prep well for colonoscopy today. She has had no evidence of rectal bleeding for about 3 days.  Objective: Vital signs in last 24 hours: Temp:  [98.1 F (36.7 C)-98.6 F (37 C)] 98.6 F (37 C) (08/10 0810) Pulse Rate:  [48-53] 48 (08/10 0810) Resp:  [17-18] 17 (08/10 0810) BP: (129-166)/(52-60) 151/58 mmHg (08/10 0810) SpO2:  [100 %] 100 % (08/10 0810) Blood pressure 151/58, pulse 48, temperature 98.6 F (37 C), temperature source Oral, resp. rate 17, height 5\' 7"  (1.702 m), weight 94.944 kg (209 lb 5 oz), SpO2 100 %.   Intake/Output from previous day: 08/09 0701 - 08/10 0700 In: 1635 [I.V.:1635] Out: 400 [Urine:400]  Intake/Output this shift: Total I/O In: 258 [I.V.:258] Out: -    General appearance:  Well-appearing 80 year old female no acute distress Resp:  Clear to auscultation Cardio:  Regular rate and rhythm GI:  Soft nontender nondistended bowel sounds positive normoactive Extremities:  No clubbing cyanosis or edema   Lab Results: Results for orders placed or performed during the hospital encounter of 12/31/14 (from the past 24 hour(s))  Glucose, capillary     Status: Abnormal   Collection Time: 01/04/15  4:38 PM  Result Value Ref Range   Glucose-Capillary 116 (H) 65 - 99 mg/dL   Comment 1 Notify RN   Glucose, capillary     Status: Abnormal   Collection Time: 01/04/15  9:16 PM  Result Value Ref Range   Glucose-Capillary 153 (H) 65 - 99 mg/dL  Protime-INR     Status: Abnormal   Collection Time: 01/05/15  6:52 AM  Result Value Ref Range   Prothrombin Time 16.7 (H) 11.4 - 15.0 seconds   INR 1.33   CBC with Differential/Platelet     Status: Abnormal   Collection Time: 01/05/15  6:52 AM  Result Value Ref Range   WBC 12.6 (H) 3.6 - 11.0 K/uL   RBC 3.43 (L) 3.80 - 5.20 MIL/uL    Hemoglobin 10.5 (L) 12.0 - 16.0 g/dL   HCT 30.9 (L) 35.0 - 47.0 %   MCV 90.3 80.0 - 100.0 fL   MCH 30.5 26.0 - 34.0 pg   MCHC 33.8 32.0 - 36.0 g/dL   RDW 14.3 11.5 - 14.5 %   Platelets 216 150 - 440 K/uL   Neutrophils Relative % 91 %   Lymphocytes Relative 6 %   Monocytes Relative 3 %   Eosinophils Relative 0 %   Basophils Relative 0 %   Neutro Abs 11.4 (H) 1.4 - 6.5 K/uL   Lymphs Abs 0.8 (L) 1.0 - 3.6 K/uL   Monocytes Absolute 0.4 0.2 - 0.9 K/uL   Eosinophils Absolute 0.0 0 - 0.7 K/uL   Basophils Absolute 0.0 0 - 0.1 K/uL   Smear Review MORPHOLOGY UNREMARKABLE   Glucose, capillary     Status: Abnormal   Collection Time: 01/05/15  7:35 AM  Result Value Ref Range   Glucose-Capillary 127 (H) 65 - 99 mg/dL   Comment 1 Notify RN   Glucose, capillary     Status: Abnormal   Collection Time: 01/05/15 11:24 AM  Result Value Ref Range   Glucose-Capillary 157 (H) 65 - 99 mg/dL   Comment 1 Notify RN   Glucose, capillary     Status: Abnormal   Collection Time: 01/05/15  1:21 PM  Result Value Ref Range   Glucose-Capillary 166 (H) 65 - 99 mg/dL      Recent Labs  01/03/15 0459 01/04/15 0527 01/05/15 0652  WBC 14.7* 10.0 12.6*  HGB 8.9* 9.9* 10.5*  HCT 25.8* 29.3* 30.9*  PLT 172 180 216   BMET  Recent Labs  01/03/15 0459 01/04/15 0527  NA 140 138  K 3.4* 3.8  CL 106 105  CO2 29 28  GLUCOSE 130* 152*  BUN 19 20  CREATININE 0.86 1.01*  CALCIUM 8.6* 8.8*   LFT No results for input(s): PROT, ALBUMIN, AST, ALT, ALKPHOS, BILITOT, BILIDIR, IBILI in the last 72 hours. PT/INR  Recent Labs  01/05/15 0652  LABPROT 16.7*  INR 1.33   Hepatitis Panel No results for input(s): HEPBSAG, HCVAB, HEPAIGM, HEPBIGM in the last 72 hours. C-Diff No results for input(s): CDIFFTOX in the last 72 hours. No results for input(s): CDIFFPCR in the last 72 hours.   Studies/Results: No results found.  Scheduled Inpatient Medications:   . [MAR Hold] acetaminophen  650 mg Oral Once   . [MAR Hold] acidophilus  1 capsule Oral Daily  . [MAR Hold] amLODipine  10 mg Oral Daily  . [MAR Hold] atenolol  50 mg Oral Daily  . [MAR Hold] calcium-vitamin D  1 tablet Oral BID  . [MAR Hold] ciprofloxacin  400 mg Intravenous Q12H  . [MAR Hold] cloNIDine  0.3 mg Oral BID  . [MAR Hold] furosemide  40 mg Oral Daily  . [MAR Hold] insulin aspart  0-5 Units Subcutaneous QHS  . [MAR Hold] insulin aspart  0-9 Units Subcutaneous TID WC  . [MAR Hold] methylPREDNISolone (SOLU-MEDROL) injection  60 mg Intravenous Q12H  . [MAR Hold] metronidazole  500 mg Intravenous Q8H  . [MAR Hold] pantoprazole (PROTONIX) IV  40 mg Intravenous Q12H  . [MAR Hold] pravastatin  40 mg Oral q1800  . [MAR Hold] quinapril  40 mg Oral BID    Continuous Inpatient Infusions:   . sodium chloride 50 mL/hr at 01/05/15 1323    PRN Inpatient Medications:  [MAR Hold] acetaminophen **OR** [MAR Hold] acetaminophen, [MAR Hold] alum & mag hydroxide-simeth, [MAR Hold] HYDROcodone-acetaminophen, [MAR Hold] ondansetron **OR** [MAR Hold] ondansetron (ZOFRAN) IV  Miscellaneous:   Assessment:  1. Hematochezia in the setting of known history of Crohn's disease. Patient has not had a colonoscopy since 2008. He has been noted to the hospital placed on IV steroids and anti-biotics and has done well.  Plan:  1. Colonoscopy today. I have discussed the risks benefits and complications of procedures to include not limited to bleeding, infection, perforation and the risk of sedation and the patient wishes to proceed.  Lollie Sails MD 01/05/2015, 1:24 PM

## 2015-01-05 NOTE — Anesthesia Preprocedure Evaluation (Signed)
Anesthesia Evaluation  Patient identified by MRN, date of birth, ID band Patient awake    Reviewed: Allergy & Precautions, NPO status , Patient's Chart, lab work & pertinent test results  History of Anesthesia Complications Negative for: history of anesthetic complications  Airway Mallampati: II  TM Distance: >3 FB Neck ROM: Full    Dental  (+) Teeth Intact   Pulmonary neg pulmonary ROS,          Cardiovascular hypertension, Pt. on medications     Neuro/Psych negative neurological ROS  negative psych ROS   GI/Hepatic negative GI ROS, Neg liver ROS,   Endo/Other  diabetes, Type 2, Oral Hypoglycemic Agents  Renal/GU negative Renal ROS     Musculoskeletal   Abdominal   Peds  Hematology   Anesthesia Other Findings   Reproductive/Obstetrics                             Anesthesia Physical Anesthesia Plan  ASA: III  Anesthesia Plan: General   Post-op Pain Management:    Induction: Intravenous  Airway Management Planned: Nasal Cannula  Additional Equipment:   Intra-op Plan:   Post-operative Plan:   Informed Consent: I have reviewed the patients History and Physical, chart, labs and discussed the procedure including the risks, benefits and alternatives for the proposed anesthesia with the patient or authorized representative who has indicated his/her understanding and acceptance.     Plan Discussed with:   Anesthesia Plan Comments:         Anesthesia Quick Evaluation

## 2015-01-05 NOTE — Transfer of Care (Signed)
Immediate Anesthesia Transfer of Care Note  Patient: Kaitlin Gomez  Procedure(s) Performed: Procedure(s): COLONOSCOPY WITH PROPOFOL (N/A)  Patient Location: PACU  Anesthesia Type:General  Level of Consciousness: sedated  Airway & Oxygen Therapy: Patient Spontanous Breathing and Patient connected to nasal cannula oxygen  Post-op Assessment: Report given to RN  Post vital signs: Reviewed and stable  Last Vitals:  Filed Vitals:   01/05/15 1420  BP: 120/50  Pulse: 51  Temp: 97.4  Resp: 17    Complications: No apparent anesthesia complications

## 2015-01-05 NOTE — Progress Notes (Signed)
Initial Nutrition Assessment       INTERVENTION:  Coordination of care: await diet progression following procedure Nutrition diet education: Pt with questions about soft diet.  Discussed with pt (low fiber diet). Teachback method used, expect good compliance   NUTRITION DIAGNOSIS:   Inadequate oral intake related to altered GI function as evidenced by NPO status.    GOAL:   Patient will meet greater than or equal to 90% of their needs    MONITOR:    (Energy intake, digestive system)  REASON FOR ASSESSMENT:   NPO/Clear Liquid Diet    ASSESSMENT:      Pt admitted with rectal bleeding, Crohn's flare. Planning colonscopy today  Past Medical History  Diagnosis Date  . Crohn's disease   . Hypertension   . Diabetes mellitus without complication   . Hyperlipemia     Current Nutrition: NPO today, was on soft diet for 1 day during admission   Food/Nutrition-Related History: Pt reports intake has been fair prior to admission   Medications: NS at 2ml/hr, calcium vit D, aspart, lasix, acidophilus, solumedrol  Electrolyte/Renal Profile and Glucose Profile:   Recent Labs Lab 01/02/15 0431 01/03/15 0459 01/04/15 0527  NA 140 140 138  K 3.3* 3.4* 3.8  CL 110 106 105  CO2 26 29 28   BUN 14 19 20   CREATININE 0.68 0.86 1.01*  CALCIUM 8.0* 8.6* 8.8*  GLUCOSE 116* 130* 152*   Protein Profile:  Recent Labs Lab 12/31/14 0500  ALBUMIN 4.1     Last BM:8/9    Weight Change: pt reports recent weight gain prior to admission    Diet Order:  Diet NPO time specified Except for: Sips with Meds  Skin:   reviewed  Height:   Ht Readings from Last 1 Encounters:  12/31/14 5\' 7"  (1.702 m)    Weight:   Wt Readings from Last 1 Encounters:  12/31/14 209 lb 5 oz (94.944 kg)     BMI:  Body mass index is 32.78 kg/(m^2).    EDUCATION NEEDS:   Education needs addressed  LOW Care Level  Cheyenne Schumm B. Zenia Resides, [place], [place] (pager)

## 2015-01-05 NOTE — Progress Notes (Signed)
[facility] NAME: Delene Morais    MR#:  [id number]  DATE OF BIRTH:  05-16-1935  SUBJECTIVE:  Feeling fine, waiting for colonoscopy this am.  REVIEW OF SYSTEMS:   Review of Systems  Constitutional: Negative for fever, chills and malaise/fatigue.  HENT: Negative for sore throat.   Eyes: Negative for blurred vision.  Respiratory: Negative for cough, hemoptysis, shortness of breath and wheezing.   Cardiovascular: Negative for chest pain, palpitations and leg swelling.  Gastrointestinal: Negative for nausea, vomiting, abdominal pain, diarrhea and blood in stool.  Genitourinary: Negative for dysuria.  Musculoskeletal: Negative for back pain.  Neurological: Negative for dizziness, tremors and headaches.  Endo/Heme/Allergies: Does not bruise/bleed easily.   Tolerating Diet: Yes (Soft) DRUG ALLERGIES:   Allergies  Allergen Reactions  . Advil [Ibuprofen] Rash   VITALS:  Blood pressure 129/55, pulse 52, temperature 98.6 F (37 C), temperature source Oral, resp. rate 13, height 5\' 7"  (1.702 m), weight 94.944 kg (209 lb 5 oz), SpO2 100 %. PHYSICAL EXAMINATION:  Physical Exam  Constitutional: She is oriented to person, place, and time and well-developed, well-nourished, and in no distress. No distress.  HENT:  Head: Normocephalic.  Eyes: No scleral icterus.  Neck: Normal range of motion. Neck supple. No JVD present. No tracheal deviation present.  Cardiovascular: Normal rate, regular rhythm and normal heart sounds.  Exam reveals no gallop and no friction rub.   No murmur heard. Pulmonary/Chest: Effort normal and breath sounds normal. No respiratory distress. She has no wheezes. She has no rales. She exhibits no tenderness.  Abdominal: Soft. Bowel sounds are normal. She exhibits no distension and no mass. There is no tenderness. There is no rebound and no guarding.  Musculoskeletal: Normal range of motion. She exhibits no edema.   Neurological: She is alert and oriented to person, place, and time.  Skin: Skin is warm. No rash noted. No erythema.  Psychiatric: Affect and judgment normal.   LABORATORY PANEL:   CBC  Recent Labs Lab 01/05/15 0652  WBC 12.6*  HGB 10.5*  HCT 30.9*  PLT 216   ------------------------------------------------------------------------------------------------------------------  Chemistries   Recent Labs Lab 12/31/14 0500  01/04/15 0527  NA 141  < > 138  K 3.7  < > 3.8  CL 108  < > 105  CO2 23  < > 28  GLUCOSE 108*  < > 152*  BUN 21*  < > 20  CREATININE 1.17*  < > 1.01*  CALCIUM 9.1  < > 8.8*  AST 20  --   --   ALT 18  --   --   ALKPHOS 65  --   --   BILITOT 0.2*  --   --   < > = values in this interval not displayed.  RADIOLOGY:  Nm Gi Blood Loss  01/01/2015   IMPRESSION: No abnormal accumulation of activity seen to suggest active GI bleeding at this time. The recent CT demonstrated apparent evidence of sigmoid colitis.   Electronically Signed   By: Garald Balding M.D.   On: 01/01/2015 02:59   Ct Abdomen Pelvis W Contrast  12/31/2014   C.  IMPRESSION: 1. Prior left hemi colectomy with unremarkable anastomosis. 2. Abnormal mural thickening and mural enhancement in the distal 15 cm of colon, likely colitis or ischemia. 3. Left nephrolithiasis 4. Lumbar degenerative disc disease   Electronically Signed   By: Andreas Newport M.D.   On: 12/31/2014 06:47   Dg  Chest Port 1 View  01/01/2015   CLINICAL DATA:  PICC line adjustment  EXAM: PORTABLE CHEST - 1 VIEW  COMPARISON:  None.  FINDINGS: The PICC line has been advanced and now extends beyond the azygos vein junction, well into the SVC. No pneumothorax. Lungs are clear.  IMPRESSION: Satisfactorily positioned left upper extremity PICC line   Electronically Signed   By: Andreas Newport M.D.   On: 01/01/2015 01:57   Dg Chest Port 1 View  01/01/2015   CLINICAL DATA:  PICC line placement  EXAM: PORTABLE CHEST - 1 VIEW  COMPARISON:   None.  FINDINGS: There is a left upper extremity PICC line with tip in the expected location of the SVC near the azygos vein junction. The lungs are clear. There is no pneumothorax.  IMPRESSION: Satisfactorily positioned left upper extremity PICC line   Electronically Signed   By: Andreas Newport M.D.   On: 01/01/2015 01:55   ASSESSMENT AND PLAN:   80 year old female with history of Crohn's disease who presented with bloody diarrhea.  1. Crohn's flare: Continue with IV steroids and IV antibiotics.  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 Also extensive diverticulosis. Biopsies were also taken to evaluate for microscopic colitis. Dr Gustavo Lah recommends  1) finish 10 day course of antibiotic 2) change iv steriooid to prednisone 40 mg daily. 3) followup o/p GI clinic in 2 weeks to begin taper. 4) start lialda 4.8 gm po daily  - start clear liquids diet and advance as tolerated with plan for d/c tomorrow.  2. Hypertension: Patient should continue her outpatient medications including Norvasc, atenolol, clonidine and quinapril.  3. Diabetes: continue siding scale insulin area and metformin is on hold.  4. Lower extreme edema: Continue Lasix.  5. Hyperlipidemia: Continue Mevacor  6. Acute blood loss anemia: Hb 8.9 and has been stable. No further bleeding.   Management plans discussed with the patient and she is in agreement.  CODE STATUS: Full   TOTAL TIME TAKING CARE OF THIS PATIENT: 25 minutes   >50% time spent on counselling and coordination of care - Yes  POSSIBLE D/C in AM , DEPENDING ON CLINICAL CONDITION AND diet tolerance   Eleazar Kimmey M.D on 01/05/2015 at 2:52 PM  Between 7am to 6pm - Pager - [phone] After 6pm go to [email or web address] - password [email or web address] [facility]  Office  [phone]  CC: Primary care physician; Marguerita Merles, MD

## 2015-01-06 LAB — GLUCOSE, CAPILLARY
GLUCOSE-CAPILLARY: 204 mg/dL — AB (ref 65–99)
GLUCOSE-CAPILLARY: 129 mg/dL — AB (ref 65–99)
Glucose-Capillary: 151 mg/dL — ABNORMAL HIGH (ref 65–99)
Glucose-Capillary: 165 mg/dL — ABNORMAL HIGH (ref 65–99)

## 2015-01-06 MED ORDER — CIPROFLOXACIN HCL 500 MG PO TABS
500.0000 mg | ORAL_TABLET | Freq: Two times a day (BID) | ORAL | Status: DC
  Administered 2015-01-07 (×2): 500 mg via ORAL
  Filled 2015-01-06 (×2): qty 1

## 2015-01-06 MED ORDER — METRONIDAZOLE 500 MG PO TABS
500.0000 mg | ORAL_TABLET | Freq: Three times a day (TID) | ORAL | Status: DC
  Administered 2015-01-06 – 2015-01-07 (×3): 500 mg via ORAL
  Filled 2015-01-06 (×3): qty 1

## 2015-01-06 NOTE — Progress Notes (Signed)
[facility] [place] NAME: Kaitlin Gomez    MR#:  [id number]  DATE OF BIRTH:  Oct 13, 1934  SUBJECTIVE:  Feeling fine, tolerated CLD.  REVIEW OF SYSTEMS:   Review of Systems  Constitutional: Negative for fever, chills and malaise/fatigue.  HENT: Negative for sore throat.   Eyes: Negative for blurred vision.  Respiratory: Negative for cough, hemoptysis, shortness of breath and wheezing.   Cardiovascular: Negative for chest pain, palpitations and leg swelling.  Gastrointestinal: Negative for nausea, vomiting, abdominal pain, diarrhea and blood in stool.  Genitourinary: Negative for dysuria.  Musculoskeletal: Negative for back pain.  Neurological: Negative for dizziness, tremors and headaches.  Endo/Heme/Allergies: Does not bruise/bleed easily.   Tolerating Diet: Yes (Soft) DRUG ALLERGIES:   Allergies  Allergen Reactions  . Advil [Ibuprofen] Rash   VITALS:  Blood pressure 156/67, pulse 49, temperature 98 F (36.7 C), temperature source Oral, resp. rate 17, height 5\' 7"  (1.702 m), weight 94.944 kg (209 lb 5 oz), SpO2 100 %. PHYSICAL EXAMINATION:  Physical Exam  Constitutional: She is oriented to person, place, and time and well-developed, well-nourished, and in no distress. No distress.  HENT:  Head: Normocephalic.  Eyes: No scleral icterus.  Neck: Normal range of motion. Neck supple. No JVD present. No tracheal deviation present.  Cardiovascular: Normal rate, regular rhythm and normal heart sounds.  Exam reveals no gallop and no friction rub.   No murmur heard. Pulmonary/Chest: Effort normal and breath sounds normal. No respiratory distress. She has no wheezes. She has no rales. She exhibits no tenderness.  Abdominal: Soft. Bowel sounds are normal. She exhibits no distension and no mass. There is no tenderness. There is no rebound and no guarding.  Musculoskeletal: Normal range of motion. She exhibits no edema.  Neurological: She  is alert and oriented to person, place, and time.  Skin: Skin is warm. No rash noted. No erythema.  Psychiatric: Affect and judgment normal.   LABORATORY PANEL:   CBC  Recent Labs Lab 01/05/15 0652  WBC 12.6*  HGB 10.5*  HCT 30.9*  PLT 216   ------------------------------------------------------------------------------------------------------------------  Chemistries   Recent Labs Lab 12/31/14 0500  01/04/15 0527  NA 141  < > 138  K 3.7  < > 3.8  CL 108  < > 105  CO2 23  < > 28  GLUCOSE 108*  < > 152*  BUN 21*  < > 20  CREATININE 1.17*  < > 1.01*  CALCIUM 9.1  < > 8.8*  AST 20  --   --   ALT 18  --   --   ALKPHOS 65  --   --   BILITOT 0.2*  --   --   < > = values in this interval not displayed.  RADIOLOGY:  Nm Gi Blood Loss  01/01/2015   IMPRESSION: No abnormal accumulation of activity seen to suggest active GI bleeding at this time. The recent CT demonstrated apparent evidence of sigmoid colitis.   Electronically Signed   By: Garald Balding M.D.   On: 01/01/2015 02:59   Ct Abdomen Pelvis W Contrast  12/31/2014   C.  IMPRESSION: 1. Prior left hemi colectomy with unremarkable anastomosis. 2. Abnormal mural thickening and mural enhancement in the distal 15 cm of colon, likely colitis or ischemia. 3. Left nephrolithiasis 4. Lumbar degenerative disc disease   Electronically Signed   By: Andreas Newport M.D.   On: 12/31/2014 06:47   Dg Chest Port 1  View  01/01/2015   CLINICAL DATA:  PICC line adjustment  EXAM: PORTABLE CHEST - 1 VIEW  COMPARISON:  None.  FINDINGS: The PICC line has been advanced and now extends beyond the azygos vein junction, well into the SVC. No pneumothorax. Lungs are clear.  IMPRESSION: Satisfactorily positioned left upper extremity PICC line   Electronically Signed   By: Andreas Newport M.D.   On: 01/01/2015 01:57   Dg Chest Port 1 View  01/01/2015   CLINICAL DATA:  PICC line placement  EXAM: PORTABLE CHEST - 1 VIEW  COMPARISON:  None.  FINDINGS:  There is a left upper extremity PICC line with tip in the expected location of the SVC near the azygos vein junction. The lungs are clear. There is no pneumothorax.  IMPRESSION: Satisfactorily positioned left upper extremity PICC line   Electronically Signed   By: Andreas Newport M.D.   On: 01/01/2015 01:55   ASSESSMENT AND PLAN:   80 year old female with history of Crohn's disease who presented with bloody diarrhea.  1. Crohn's flare: Continue with IV steroids and IV antibiotics.  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 Also extensive diverticulosis. Biopsies were also taken to evaluate for microscopic colitis. Dr Gustavo Lah recommends  1) finish 10 day course of antibiotic (Cipro + Flagyl) 2) change iv steriooid to prednisone 40 mg daily. 3) followup o/p GI clinic in 2 weeks to begin taper. 4) started lialda 4.8 gm po daily  - started clear liquids diet and advance as tolerated with plan for d/c tomorrow.  2. Hypertension: Patient should continue her outpatient medications including Norvasc, atenolol, clonidine and quinapril.  3. Diabetes: continue siding scale insulin area and metformin is on hold.  4. Lower extreme edema: Continue Lasix.  5. Hyperlipidemia: Continue Mevacor  6. Acute blood loss anemia: Hb 8.9 and has been stable. No further bleeding.   Management plans discussed with the patient and she is in agreement.  CODE STATUS: Full   TOTAL TIME TAKING CARE OF THIS PATIENT: 25 minutes   >50% time spent on counselling and coordination of care - Yes  POSSIBLE D/C in AM , DEPENDING ON CLINICAL CONDITION AND diet tolerance   Manuella Ghazi, Naphtali Riede M.D on 01/06/2015 at 1:12 PM  Between 7am to 6pm - Pager - [phone] After 6pm go to [email or web address] - password [email or web address] [facility]  Office  [phone]  CC: Primary care physician; Marguerita Merles, MD

## 2015-01-06 NOTE — Care Management Important Message (Signed)
Important Message  Patient Details  Name: Kaitlin Gomez MRN: [id number] Date of Birth: 1935/03/07   Medicare Important Message Given:  Yes-third notification given    Juliann Pulse A Allmond 01/06/2015, 10:13 AM

## 2015-01-06 NOTE — Consult Note (Signed)
Pt with Crohn's disease and her bleeding has stopped.  No abd pain, hgb 8.9, to be on 10d of cipro and flagyl, on prednisone 40mg  a day. i cautioned her about checking her blood sugar frequently when she goes home.  She said plans are for her to go home tomorrow.  Follow up with Dr. Gustavo Lah .

## 2015-01-07 ENCOUNTER — Encounter: Payer: Self-pay | Admitting: Gastroenterology

## 2015-01-07 LAB — SURGICAL PATHOLOGY

## 2015-01-07 LAB — GLUCOSE, CAPILLARY
Glucose-Capillary: 141 mg/dL — ABNORMAL HIGH (ref 65–99)
Glucose-Capillary: 223 mg/dL — ABNORMAL HIGH (ref 65–99)

## 2015-01-07 MED ORDER — MESALAMINE 1.2 G PO TBEC: 4.8000 g | DELAYED_RELEASE_TABLET | Freq: Every day | ORAL | Status: DC

## 2015-01-07 MED ORDER — PREDNISONE 10 MG PO TABS: 40.0000 mg | ORAL_TABLET | Freq: Every day | ORAL | Status: DC

## 2015-01-07 MED ORDER — METRONIDAZOLE 500 MG PO TABS: 500.0000 mg | ORAL_TABLET | Freq: Three times a day (TID) | ORAL | Status: DC

## 2015-01-07 MED ORDER — CIPROFLOXACIN HCL 500 MG PO TABS: 500.0000 mg | ORAL_TABLET | Freq: Two times a day (BID) | ORAL | Status: DC

## 2015-01-07 NOTE — Progress Notes (Signed)
Pt stable. D/c instructions and education provided. PICC line removed. Questions answered. Prescriptions electronically sent to pharmacy. Pt to be escorted out by staff.

## 2015-01-07 NOTE — Discharge Instructions (Signed)
Crohn Disease  Crohn disease is a long-term (chronic) soreness and redness (inflammation) of the intestines (bowel). It can affect any portion of the digestive tract, from the mouth to the anus. It can also cause problems outside the digestive tract. Crohn disease is closely related to a disease called ulcerative colitis (together, these two diseases are called inflammatory bowel disease).   CAUSES   The cause of Crohn disease is not known. One theory is that, in an easily affected person, the immune system is triggered to attack the body's own digestive tissue. Crohn disease runs in families. It seems to be more common in certain geographic areas and amongst certain races. There are no clear-cut dietary causes.   SYMPTOMS   Crohn disease can cause many different symptoms since it can affect many different parts of the body. Symptoms include:  · Fatigue.  · Weight loss.  · Chronic diarrhea, sometime bloody.  · Abdominal pain and cramps.  · Fever.  · Ulcers or canker sores in the mouth or rectum.  · Anemia (low red blood cells).  · Arthritis, skin problems, and eye problems may occur.  Complications of Crohn disease can include:  · Series of holes (perforation) of the bowel.  · Portions of the intestines sticking to each other (adhesions).  · Obstruction of the bowel.  · Fistula formation, typically in the rectal area but also in other areas. A fistula is an opening between the bowels and the outside, or between the bowels and another organ.  · A painful crack in the mucous membrane of the anus (rectal fissure).  DIAGNOSIS   Your caregiver may suspect Crohn disease based on your symptoms and an exam. Blood tests may confirm that there is a problem. You may be asked to submit a stool specimen for examination. X-rays and CT scans may be necessary. Ultimately, the diagnosis is usually made after a procedure that uses a flexible tube that is inserted via your mouth or your anus. This is done under sedation and is called  either an upper endoscopy or colonoscopy. With these tests, the specialist can take tiny tissue samples and remove them from the inside of the bowel (biopsy). Examination of this biopsy tissue under a microscope can reveal Crohn disease as the cause of your symptoms.  Due to the many different forms that Crohn disease can take, symptoms may be present for several years before a diagnosis is made.  TREATMENT   Medications are often used to decrease inflammation and control the immune system. These include medicines related to aspirin, steroid medications, and newer and stronger medications to slow down the immune system. Some medications may be used as suppositories or enemas. A number of other medications are used or have been studied. Your caregiver will make specific recommendations.  HOME CARE INSTRUCTIONS   · Symptoms such as diarrhea can be controlled with medications. Avoid foods that have a laxative effect such as fresh fruit, vegetables, and dairy products. During flare-ups, you can rest your bowel by refraining from solid foods. Drink clear liquids frequently during the day. (Electrolyte or rehydrating fluids are best. Your caregiver can help you with suggestions.) Drink often to prevent loss of body fluids (dehydration). When diarrhea has cleared, eat small meals and more frequently. Avoid food additives and stimulants such as caffeine (coffee, tea, or chocolate). Enzyme supplements may help if you develop intolerance to a sugar in dairy products (lactose). Ask your caregiver or dietitian about specific dietary instructions.  · Try to   maintain a positive attitude. Learn relaxation techniques such as self-hypnosis, mental imaging, and muscle relaxation.  · If possible, avoid stresses which can aggravate your condition.  · Exercise regularly.  · Follow your diet.  · Always get plenty of rest.  SEEK MEDICAL CARE IF:   · Your symptoms fail to improve after a week or two of new treatment.  · You experience  continued weight loss.  · You have ongoing cramps or loose bowels.  · You develop a new skin rash, skin sores, or eye problems.  SEEK IMMEDIATE MEDICAL CARE IF:   · You have worsening of your symptoms or develop new symptoms.  · You have a fever.  · You develop bloody diarrhea.  · You develop severe abdominal pain.  MAKE SURE YOU:   · Understand these instructions.  · Will watch your condition.  · Will get help right away if you are not doing well or get worse.  Document Released: 02/21/2005 Document Revised: 09/28/2013 Document Reviewed: 01/20/2007  Exit[employer]® Patient Information ©2015 [employer]. This information is not intended to replace advice given to you by your health care provider. Make sure you discuss any questions you have with your health care provider.

## 2015-01-09 NOTE — Discharge Summary (Signed)
[facility] [place] NAME: Kaitlin Gomez    MR#:  [id number]  DATE OF BIRTH:  [birth year]  DATE OF ADMISSION:  12/31/2014 ADMITTING PHYSICIAN: Bettey Costa, MD  DATE OF DISCHARGE: 01/07/2015  2:52 PM  PRIMARY CARE PHYSICIAN: Marguerita Merles, MD    ADMISSION DIAGNOSIS:  Lower GI bleed [K92.2] Crohn's disease, unspecified complication [W09.811]  DISCHARGE DIAGNOSIS:  Active Problems:   Colitis   SECONDARY DIAGNOSIS:   Past Medical History  Diagnosis Date  . Crohn's disease   . Hypertension   . Diabetes mellitus without complication   . Hyperlipemia    HOSPITAL COURSE:  80 y.o. female with a known history of Crohn's disease, hypertension and diabetes was admitted for Abdominal pain with bloody bowel movements secondary to Crohn's flare. Please see Dr Gardiner Coins dictated [facility] for further details. She was started on IV steroids and antibiotics and GI consultation as obtained with Dr Gustavo Lah who recommended colonoscopy which was performed on 10th August showing significant stricture of colon likely due to crohn's disease. She was recommended to 10 day course of antibiotic (Cipro + Flagyl) along with steroid taper and lialda 4.8 gm po daily which would be maintenance medication.   She was sloely started back on diet and she tolerated it fine and was D/C home in stable condition. She was agreeable with D/C plans. DISCHARGE CONDITIONS:  STABLE CONSULTS OBTAINED:  Treatment Team:  Lollie Sails, MD Katha Cabal, MD  DRUG ALLERGIES:   Allergies  Allergen Reactions  . Advil [Ibuprofen] Rash   DISCHARGE MEDICATIONS:   Discharge Medication List as of 01/07/2015  1:50 PM    START taking these medications   Details  ciprofloxacin (CIPRO) 500 MG tablet Take 1 tablet (500 mg total) by mouth 2 (two) times daily., Starting 01/07/2015, Until Discontinued, Normal    mesalamine (LIALDA) 1.2 G EC tablet Take 4 tablets (4.8 g total) by  mouth daily with breakfast., Starting 01/07/2015, Until Discontinued, Normal    metroNIDAZOLE (FLAGYL) 500 MG tablet Take 1 tablet (500 mg total) by mouth every 8 (eight) hours., Starting 01/07/2015, Until Discontinued, Normal    predniSONE (DELTASONE) 10 MG tablet Take 4 tablets (40 mg total) by mouth daily with breakfast. Start 40 mg daily, taper 10 mg every other day until finish., Starting 01/07/2015, Until Discontinued, Normal      CONTINUE these medications which have NOT CHANGED   Details  amLODipine (NORVASC) 10 MG tablet Take 10 mg by mouth daily., Until Discontinued, Historical Med    aspirin 81 MG chewable tablet Chew 81 mg by mouth daily., Until Discontinued, Historical Med    atenolol (TENORMIN) 50 MG tablet Take 50 mg by mouth daily., Until Discontinued, Historical Med    calcium-vitamin D (OSCAL 500/200 D-3) 500-200 MG-UNIT per tablet Take 1 tablet by mouth 2 (two) times daily., Until Discontinued, Historical Med    cloNIDine (CATAPRES) 0.3 MG tablet Take 0.3 mg by mouth., Until Discontinued, Historical Med    furosemide (LASIX) 40 MG tablet Take 40 mg by mouth daily., Until Discontinued, Historical Med    lovastatin (MEVACOR) 40 MG tablet Take 40 mg by mouth at bedtime., Until Discontinued, Historical Med    metFORMIN (GLUCOPHAGE) 500 MG tablet Take 500 mg by mouth daily., Until Discontinued, Historical Med    quinapril (ACCUPRIL) 40 MG tablet Take 40 mg by mouth 2 (two) times daily., Until Discontinued, Historical Med    meloxicam (MOBIC) 7.5 MG tablet  Take 7.5 mg by mouth daily., Until Discontinued, Historical Med       DISCHARGE INSTRUCTIONS:   DIET:  Cardiac diet DISCHARGE CONDITION:  Good ACTIVITY:  Activity as tolerated OXYGEN:  Home Oxygen: No.  Oxygen Delivery: room air DISCHARGE LOCATION:  home   If you experience worsening of your admission symptoms, develop shortness of breath, life threatening emergency, suicidal or homicidal thoughts you must  seek medical attention immediately by calling 911 or calling your MD immediately  if symptoms less severe.  You Must read complete instructions/literature along with all the possible adverse reactions/side effects for all the Medicines you take and that have been prescribed to you. Take any new Medicines after you have completely understood and accpet all the possible adverse reactions/side effects.   Please note  You were cared for by a hospitalist during your hospital stay. If you have any questions about your discharge medications or the care you received while you were in the hospital after you are discharged, you can call the unit and asked to speak with the hospitalist on call if the hospitalist that took care of you is not available. Once you are discharged, your primary care physician will handle any further medical issues. Please note that NO REFILLS for any discharge medications will be authorized once you are discharged, as it is imperative that you return to your primary care physician (or establish a relationship with a primary care physician if you do not have one) for your aftercare needs so that they can reassess your need for medications and monitor your lab values.    On the day of Discharge: VITAL SIGNS:  Blood pressure 154/60, pulse 49, temperature 98 F (36.7 C), temperature source Oral, resp. rate 18, height 5\' 7"  (1.702 m), weight 94.944 kg (209 lb 5 oz), SpO2 100 %. PHYSICAL EXAMINATION:  GENERAL:  80 y.o.-year-old patient lying in the bed with no acute distress.  EYES: Pupils equal, round, reactive to light and accommodation. No scleral icterus. Extraocular muscles intact.  HEENT: Head atraumatic, normocephalic. Oropharynx and nasopharynx clear.  NECK:  Supple, no jugular venous distention. No thyroid enlargement, no tenderness.  LUNGS: Normal breath sounds bilaterally, no wheezing, rales,rhonchi or crepitation. No use of accessory muscles of respiration.  CARDIOVASCULAR:  S1, S2 normal. No murmurs, rubs, or gallops.  ABDOMEN: Soft, non-tender, non-distended. Bowel sounds present. No organomegaly or mass.  EXTREMITIES: No pedal edema, cyanosis, or clubbing.  NEUROLOGIC: Cranial nerves II through XII are intact. Muscle strength 5/5 in all extremities. Sensation intact. Gait not checked.  PSYCHIATRIC: The patient is alert and oriented x 3.  SKIN: No obvious rash, lesion, or ulcer.  DATA REVIEW:   CBC  Recent Labs Lab 01/05/15 0652  WBC 12.6*  HGB 10.5*  HCT 30.9*  PLT 216    Chemistries   Recent Labs Lab 01/04/15 0527  NA 138  K 3.8  CL 105  CO2 28  GLUCOSE 152*  BUN 20  CREATININE 1.01*  CALCIUM 8.8*    Management plans discussed with the patient, family and they are in agreement.  CODE STATUS: Full Code  TOTAL TIME TAKING CARE OF THIS PATIENT: 55 minutes.    [facility], Kirill Chatterjee M.D on 01/09/2015 at 9:34 PM  Between 7am to 6pm - Pager - [phone]  After 6pm go to [email or web address] - password [email or web address] [facility]  Office  [phone]  CC: Primary care physician; Marguerita Merles, MD Lollie Sails, MD

## 2015-01-23 ENCOUNTER — Emergency Department: Payer: Medicare Other

## 2015-01-23 ENCOUNTER — Emergency Department
Admission: EM | Admit: 2015-01-23 | Discharge: 2015-01-23 | Disposition: A | Discharge status: Home / Self Care | Payer: Medicare Other | Attending: Emergency Medicine | Admitting: Emergency Medicine

## 2015-01-23 ENCOUNTER — Other Ambulatory Visit: Payer: Self-pay

## 2015-01-23 ENCOUNTER — Encounter: Payer: Self-pay | Admitting: Radiology

## 2015-01-23 DIAGNOSIS — Z791 Long term (current) use of non-steroidal anti-inflammatories (NSAID): Secondary | ICD-10-CM | POA: Diagnosis not present

## 2015-01-23 DIAGNOSIS — W19XXXA Unspecified fall, initial encounter: Secondary | ICD-10-CM

## 2015-01-23 DIAGNOSIS — R509 Fever, unspecified: Secondary | ICD-10-CM | POA: Diagnosis not present

## 2015-01-23 DIAGNOSIS — R42 Dizziness and giddiness: Secondary | ICD-10-CM | POA: Diagnosis not present

## 2015-01-23 DIAGNOSIS — S0990XA Unspecified injury of head, initial encounter: Secondary | ICD-10-CM | POA: Diagnosis not present

## 2015-01-23 DIAGNOSIS — Y9389 Activity, other specified: Secondary | ICD-10-CM | POA: Diagnosis not present

## 2015-01-23 DIAGNOSIS — R1084 Generalized abdominal pain: Secondary | ICD-10-CM | POA: Insufficient documentation

## 2015-01-23 DIAGNOSIS — S3992XA Unspecified injury of lower back, initial encounter: Secondary | ICD-10-CM | POA: Diagnosis not present

## 2015-01-23 DIAGNOSIS — I10 Essential (primary) hypertension: Secondary | ICD-10-CM | POA: Diagnosis not present

## 2015-01-23 DIAGNOSIS — Y998 Other external cause status: Secondary | ICD-10-CM | POA: Diagnosis not present

## 2015-01-23 DIAGNOSIS — Z792 Long term (current) use of antibiotics: Secondary | ICD-10-CM | POA: Insufficient documentation

## 2015-01-23 DIAGNOSIS — Y9289 Other specified places as the place of occurrence of the external cause: Secondary | ICD-10-CM | POA: Diagnosis not present

## 2015-01-23 DIAGNOSIS — Z79899 Other long term (current) drug therapy: Secondary | ICD-10-CM | POA: Insufficient documentation

## 2015-01-23 DIAGNOSIS — Z7952 Long term (current) use of systemic steroids: Secondary | ICD-10-CM | POA: Insufficient documentation

## 2015-01-23 DIAGNOSIS — Z8719 Personal history of other diseases of the digestive system: Secondary | ICD-10-CM | POA: Diagnosis not present

## 2015-01-23 DIAGNOSIS — S199XXA Unspecified injury of neck, initial encounter: Secondary | ICD-10-CM | POA: Insufficient documentation

## 2015-01-23 DIAGNOSIS — W01198A Fall on same level from slipping, tripping and stumbling with subsequent striking against other object, initial encounter: Secondary | ICD-10-CM | POA: Diagnosis not present

## 2015-01-23 DIAGNOSIS — E119 Type 2 diabetes mellitus without complications: Secondary | ICD-10-CM | POA: Diagnosis not present

## 2015-01-23 DIAGNOSIS — Z7982 Long term (current) use of aspirin: Secondary | ICD-10-CM | POA: Diagnosis not present

## 2015-01-23 LAB — COMPREHENSIVE METABOLIC PANEL
ALK PHOS: 39 U/L (ref 38–126)
ALT: 13 U/L — ABNORMAL LOW (ref 14–54)
ANION GAP: 8 (ref 5–15)
AST: 18 U/L (ref 15–41)
Albumin: 3.4 g/dL — ABNORMAL LOW (ref 3.5–5.0)
BUN: 17 mg/dL (ref 6–20)
CALCIUM: 8.7 mg/dL — AB (ref 8.9–10.3)
CO2: 27 mmol/L (ref 22–32)
Chloride: 103 mmol/L (ref 101–111)
Creatinine, Ser: 1.21 mg/dL — ABNORMAL HIGH (ref 0.44–1.00)
GFR calc non Af Amer: 42 mL/min — ABNORMAL LOW (ref >60)
GFR, EST AFRICAN AMERICAN: 48 mL/min — AB (ref >60)
Glucose, Bld: 132 mg/dL — ABNORMAL HIGH (ref 65–99)
Potassium: 3.1 mmol/L — ABNORMAL LOW (ref 3.5–5.1)
Sodium: 138 mmol/L (ref 135–145)
Total Bilirubin: 1 mg/dL (ref 0.3–1.2)
Total Protein: 6.2 g/dL — ABNORMAL LOW (ref 6.5–8.1)

## 2015-01-23 LAB — URINALYSIS COMPLETE WITH MICROSCOPIC (ARMC ONLY)
Bilirubin Urine: NEGATIVE
Glucose, UA: NEGATIVE mg/dL
HGB URINE DIPSTICK: NEGATIVE
Ketones, ur: NEGATIVE mg/dL
NITRITE: NEGATIVE
PROTEIN: NEGATIVE mg/dL
SPECIFIC GRAVITY, URINE: 1.006 (ref 1.005–1.030)
pH: 6 (ref 5.0–8.0)

## 2015-01-23 LAB — CBC
HCT: 29 % — ABNORMAL LOW (ref 35.0–47.0)
Hemoglobin: 9.5 g/dL — ABNORMAL LOW (ref 12.0–16.0)
MCH: 29.5 pg (ref 26.0–34.0)
MCHC: 32.8 g/dL (ref 32.0–36.0)
MCV: 90 fL (ref 80.0–100.0)
Platelets: 167 10*3/uL (ref 150–440)
RBC: 3.22 MIL/uL — AB (ref 3.80–5.20)
RDW: 14.3 % (ref 11.5–14.5)
WBC: 7.2 10*3/uL (ref 3.6–11.0)

## 2015-01-23 LAB — TROPONIN I

## 2015-01-23 MED ORDER — HYDROCODONE-ACETAMINOPHEN 5-325 MG PO TABS: 1.0000 | ORAL_TABLET | ORAL | Status: DC | PRN

## 2015-01-23 MED ORDER — SODIUM CHLORIDE 0.9 % IV BOLUS (SEPSIS)
1000.0000 mL | Freq: Once | INTRAVENOUS | Status: AC
  Administered 2015-01-23: 1000 mL via INTRAVENOUS

## 2015-01-23 MED ORDER — IOHEXOL 300 MG/ML  SOLN
80.0000 mL | Freq: Once | INTRAMUSCULAR | Status: AC | PRN
  Administered 2015-01-23: 80 mL via INTRAVENOUS

## 2015-01-23 MED ORDER — IOHEXOL 240 MG/ML SOLN
25.0000 mL | Freq: Once | INTRAMUSCULAR | Status: AC | PRN
  Administered 2015-01-23: 25 mL via ORAL

## 2015-01-23 NOTE — ED Provider Notes (Addendum)
-----------------------------------------   9:07 AM on 01/23/2015 -----------------------------------------  I have seen and evaluated the patient. Patient has a fever to 101 upon arrival to the emergency department. Patient's workup so far been within normal limits, clear chest x-ray, normal urinalysis, labs largely at her baseline. CTs of been within normal limits. The patient does have some generalized abdominal discomfort she states it feels "sore." Mild diffuse abdominal tenderness palpation, no rebound or guarding. No focal tenderness elicited. Given the patient's abdominal tenderness with fever to 101 any history of Crohn's colitis we will proceed with a CT abdomen and pelvis. I discussed this plan of care with the patient who is agreeable. We will also IV hydrate as the patient states she feels dry. We will continue to closely monitor in the emergency department while awaiting CT results.  Patient states he has resulted in largely normal results. Patient states she feels really well, is ready to go home. We'll discharge patient home with primary care follow-up tomorrow. I discussed strict return precautions with the patient for any abdominal pain, chest pain, syncope, or any other symptom personally concerning to her to return to the emergency department. Patient agreeable.  Harvest Dark, MD 01/23/15 1138  Harvest Dark, MD 01/23/15 [phone]

## 2015-01-23 NOTE — ED Provider Notes (Signed)
[facility] Emergency Department Provider Note  ____________________________________________  Time seen: Approximately 6:56 AM  I have reviewed the triage vital signs and the nursing notes.   HISTORY  Chief Complaint Dizziness; Fall; and Headache    HPI MEKENNA FINAU is a 80 y.o. female who had a history of colitis with with a recent visit. She takes Pentasa for Crohn's disease. Patient got up early this morning and felt a little woozy took a bit of time to get out of bed went to go to the bathroom to urinate and on the way back got very weak and lightheaded and fell down before help come to her. She reports she had a mild headache when she woke up but after she fell and hit her head she now has a bad headache she also complains of some pain in the neck which she did not have before she fell. There has some pain across the low back that she's had for a little bit. Her belly hurts mildly but much less than it has been hurting in a few days before this. She did not realize she had a fever. She denies any rash any cough any nausea or vomiting or any worsening in her belly pain or burning when she urinates   Past Medical History  Diagnosis Date  . Crohn's disease   . Hypertension   . Diabetes mellitus without complication   . Hyperlipemia     Patient Active Problem List   Diagnosis Date Noted  . Colitis 12/31/2014    Past Surgical History  Procedure Laterality Date  . Colonoscopy with propofol N/A 01/05/2015    Procedure: COLONOSCOPY WITH PROPOFOL;  Surgeon: Lollie Sails, MD;  Location: [facility] ENDOSCOPY;  Service: Endoscopy;  Laterality: N/A;    Current Outpatient Rx  Name  Route  Sig  Dispense  Refill  . amLODipine (NORVASC) 10 MG tablet   Oral   Take 10 mg by mouth daily.         Marland Kitchen aspirin 81 MG chewable tablet   Oral   Chew 81 mg by mouth daily.         Marland Kitchen atenolol (TENORMIN) 50 MG tablet   Oral   Take 50 mg by mouth daily.         .  calcium-vitamin D (OSCAL 500/200 D-3) 500-200 MG-UNIT per tablet   Oral   Take 1 tablet by mouth 2 (two) times daily.         . ciprofloxacin (CIPRO) 500 MG tablet   Oral   Take 1 tablet (500 mg total) by mouth 2 (two) times daily.   6 tablet   0   . cloNIDine (CATAPRES) 0.3 MG tablet   Oral   Take 0.3 mg by mouth.         . furosemide (LASIX) 40 MG tablet   Oral   Take 40 mg by mouth daily.         Marland Kitchen lovastatin (MEVACOR) 40 MG tablet   Oral   Take 40 mg by mouth at bedtime.         . meloxicam (MOBIC) 7.5 MG tablet   Oral   Take 7.5 mg by mouth daily.         . mesalamine (LIALDA) 1.2 G EC tablet   Oral   Take 4 tablets (4.8 g total) by mouth daily with breakfast.   30 tablet   0   . metFORMIN (GLUCOPHAGE) 500 MG tablet  Oral   Take 500 mg by mouth daily.         . metroNIDAZOLE (FLAGYL) 500 MG tablet   Oral   Take 1 tablet (500 mg total) by mouth every 8 (eight) hours.   9 tablet   0   . predniSONE (DELTASONE) 10 MG tablet   Oral   Take 4 tablets (40 mg total) by mouth daily with breakfast. Start 40 mg daily, taper 10 mg every other day until finish.   21 tablet   0   . quinapril (ACCUPRIL) 40 MG tablet   Oral   Take 40 mg by mouth 2 (two) times daily.           Allergies Advil  No family history on file.  Social History Social History  Substance Use Topics  . Smoking status: Never Smoker   . Smokeless tobacco: Not on file  . Alcohol Use: No    Review of Systems Constitutional: No fever/chills please see history of present illness about the fever Eyes: No visual changes. ENT: No sore throat. Cardiovascular: Denies chest pain. Respiratory: Denies shortness of breath. Gastrointestinal: No change in her abdominal pain.  No nausea, no vomiting.  No diarrhea.  No constipation. Genitourinary: Negative for dysuria. Musculoskeletal: Negative for back pain. Skin: Negative for rash. Neurological: Negative for headaches, focal  weakness or numbness.  10-point ROS otherwise negative.  ____________________________________________   PHYSICAL EXAM:  VITAL SIGNS: ED Triage Vitals  Enc Vitals Group     BP 01/23/15 0624 125/53 mmHg     Pulse Rate 01/23/15 0624 69     Resp --      Temp 01/23/15 0624 101 F (38.3 C)     Temp Source 01/23/15 0624 Oral     SpO2 01/23/15 0624 100 %     Weight 01/23/15 0624 201 lb (91.173 kg)     Height 01/23/15 0624 5' 7.5" (1.715 m)     Head Cir --      Peak Flow --      Pain Score 01/23/15 0626 5     Pain Loc --      Pain Edu? --      Excl. in [place]? --    Constitutional: Alert and oriented. Well appearing and in no acute distress. Eyes: Conjunctivae are normal. PERRL. EOMI. Head: Atraumatic. Nose: No congestion/rhinnorhea. Mouth/Throat: Mucous membranes are moist.  Oropharynx non-erythematous. Neck: No stridor.  There is a small amount of neck tenderness on exam Cardiovascular: Normal rate, regular rhythm. Grossly normal heart sounds.  Good peripheral circulation. Respiratory: Normal respiratory effort.  No retractions. Lungs CTAB. Gastrointestinal: Soft and nontender. No distention. No abdominal bruits. No CVA tenderness. Musculoskeletal: No lower extremity tenderness nor edema.  No joint effusions. Patient does complain of some low back pain across the belt line away across the back after the fall Neurologic:  Normal speech and language. No gross focal neurologic deficits are appreciated. No gait instability. Skin:  Skin is warm, dry and intact. No rash noted. Psychiatric: Mood and affect are normal. Speech and behavior are normal.  ____________________________________________   LABS (all labs ordered are listed, but only abnormal results are displayed)  Labs Reviewed  COMPREHENSIVE METABOLIC PANEL - Abnormal; Notable for the following:    Potassium 3.1 (*)    Glucose, Bld 132 (*)    Creatinine, Ser 1.21 (*)    Calcium 8.7 (*)    Total Protein 6.2 (*)    Albumin  3.4 (*)  ALT 13 (*)    GFR calc non Af Amer 42 (*)    GFR calc Af Amer 48 (*)    All other components within normal limits  CBC - Abnormal; Notable for the following:    RBC 3.22 (*)    Hemoglobin 9.5 (*)    HCT 29.0 (*)    All other components within normal limits  URINE CULTURE  CULTURE, BLOOD (ROUTINE X 2)  CULTURE, BLOOD (ROUTINE X 2)  TROPONIN I  URINALYSIS COMPLETEWITH MICROSCOPIC ([facility] ONLY)   ____________________________________________  EKG  EKG read and interpreted by me shows normal sinus rhythm at a rate of 68 normal axis no acute ST-T wave changes there is some irregularity in the baseline that approximates atrial flutter however it does not carry through and I do not believe it is his actual atrial flutter ____________________________________________  RADIOLOGY   ____________________________________________   PROCEDURES    ____________________________________________   INITIAL IMPRESSION / ASSESSMENT AND PLAN / ED COURSE  Pertinent labs & imaging results that were available during my care of the patient were reviewed by me and considered in my medical decision making (see chart for details). Patient signed out to Dr. Mamie Nick.  ____________________________________________   FINAL CLINICAL IMPRESSION(S) / ED DIAGNOSES  Final diagnoses:  Fall, initial encounter  Fever, unspecified fever cause      Nena Polio, MD 01/23/15 [id number]

## 2015-01-23 NOTE — ED Notes (Signed)
Pt states "i just wasn't feeling well, i got up to go to the bathroom, got dizzy while i was sitting there, then stood up, got real dizzy, fell and hit the side of my head." pt complains of posterior cervical spine pain, left latera skull pain on palpation. Pt with chills presently. Pt denies dysuria, hematuria. Pt states has chronic back pain and knee pain, states "i think it's from that medicine they started me on."

## 2015-01-23 NOTE — Discharge Instructions (Signed)
Abdominal Pain Many things can cause abdominal pain. Usually, abdominal pain is not caused by a disease and will improve without treatment. It can often be observed and treated at home. Your health care provider will do a physical exam and possibly order blood tests and X-rays to help determine the seriousness of your pain. However, in many cases, more time must pass before a clear cause of the pain can be found. Before that point, your health care provider may not know if you need more testing or further treatment. HOME CARE INSTRUCTIONS  Monitor your abdominal pain for any changes. The following actions may help to alleviate any discomfort you are experiencing:  Only take over-the-counter or prescription medicines as directed by your health care provider.  Do not take laxatives unless directed to do so by your health care provider.  Try a clear liquid diet (broth, tea, or water) as directed by your health care provider. Slowly move to a bland diet as tolerated. SEEK MEDICAL CARE IF:  You have unexplained abdominal pain.  You have abdominal pain associated with nausea or diarrhea.  You have pain when you urinate or have a bowel movement.  You experience abdominal pain that wakes you in the night.  You have abdominal pain that is worsened or improved by eating food.  You have abdominal pain that is worsened with eating fatty foods.  You have a fever. SEEK IMMEDIATE MEDICAL CARE IF:   Your pain does not go away within 2 hours.  You keep throwing up (vomiting).  Your pain is felt only in portions of the abdomen, such as the right side or the left lower portion of the abdomen.  You pass bloody or black tarry stools. MAKE SURE YOU:  Understand these instructions.   Will watch your condition.   Will get help right away if you are not doing well or get worse.  Document Released: 02/21/2005 Document Revised: 05/19/2013 Document Reviewed: 01/21/2013 [facility] Patient Information  2015 [place], [place]. This information is not intended to replace advice given to you by your health care provider. Make sure you discuss any questions you have with your health care provider.  Dizziness  Dizziness means you feel unsteady or lightheaded. You might feel like you are going to pass out (faint). HOME CARE   Drink enough fluids to keep your pee (urine) clear or pale yellow.  Take your medicines exactly as told by your doctor. If you take blood pressure medicine, always stand up slowly from the lying or sitting position. Hold on to something to steady yourself.  If you need to stand in one place for a long time, move your legs often. Tighten and relax your leg muscles.  Have someone stay with you until you feel okay.  Do not drive or use heavy machinery if you feel dizzy.  Do not drink alcohol. GET HELP RIGHT AWAY IF:   You feel dizzy or lightheaded and it gets worse.  You feel sick to your stomach (nauseous), or you throw up (vomit).  You have trouble talking or walking.  You feel weak or have trouble using your arms, hands, or legs.  You cannot think clearly or have trouble forming sentences.  You have chest pain, belly (abdominal) pain, sweating, or you are short of breath.  Your vision changes.  You are bleeding.  You have problems from your medicine that seem to be getting worse. MAKE SURE YOU:   Understand these instructions.  Will watch your condition.  Will  get help right away if you are not doing well or get worse. Document Released: 05/03/2011 Document Revised: 08/06/2011 Document Reviewed: 05/03/2011 [facility] Patient Information 2015 [place], [place]. This information is not intended to replace advice given to you by your health care provider. Make sure you discuss any questions you have with your health care provider.

## 2015-01-23 NOTE — ED Notes (Signed)
Returned from CT and X-Ray  

## 2015-01-23 NOTE — ED Notes (Signed)
Report to ashley, rn

## 2015-01-23 NOTE — ED Notes (Signed)
Patient transported to CT 

## 2015-01-23 NOTE — ED Notes (Signed)
Rigid c collar applied on arrival to room. Cms intact to all extremities before and after application.

## 2015-01-23 NOTE — ED Notes (Signed)
C-collar removed per MD instruction.

## 2015-01-23 NOTE — ED Notes (Signed)
Pt says she woke around 4am to go to the bathroom; became dizzy and had to sit back down; eventually she was able to ambulate to the toilet; when she tried to return to bed the dizziness returned and she fell; hit the left side of her head on the floor; contusion to area; no loss of consciousness; difficulty ambulating-had to be assisted x 2 to wheelchair from car upon arrival

## 2015-01-24 LAB — URINE CULTURE: SPECIAL REQUESTS: NORMAL

## 2015-01-28 LAB — CULTURE, BLOOD (ROUTINE X 2)
CULTURE: NO GROWTH
Culture: NO GROWTH

## 2016-10-30 ENCOUNTER — Inpatient Hospital Stay
Admission: EM | Admit: 2016-10-30 | Discharge: 2016-11-02 | DRG: 378 | Disposition: A | Discharge status: Home / Self Care | Payer: Medicare Other | Attending: Internal Medicine | Admitting: Internal Medicine

## 2016-10-30 ENCOUNTER — Encounter: Payer: Self-pay | Admitting: Internal Medicine

## 2016-10-30 DIAGNOSIS — Z7984 Long term (current) use of oral hypoglycemic drugs: Secondary | ICD-10-CM | POA: Diagnosis not present

## 2016-10-30 DIAGNOSIS — Z791 Long term (current) use of non-steroidal anti-inflammatories (NSAID): Secondary | ICD-10-CM | POA: Diagnosis not present

## 2016-10-30 DIAGNOSIS — K624 Stenosis of anus and rectum: Secondary | ICD-10-CM | POA: Diagnosis not present

## 2016-10-30 DIAGNOSIS — K625 Hemorrhage of anus and rectum: Secondary | ICD-10-CM | POA: Diagnosis not present

## 2016-10-30 DIAGNOSIS — Z7982 Long term (current) use of aspirin: Secondary | ICD-10-CM

## 2016-10-30 DIAGNOSIS — K5090 Crohn's disease, unspecified, without complications: Secondary | ICD-10-CM | POA: Diagnosis present

## 2016-10-30 DIAGNOSIS — Z79899 Other long term (current) drug therapy: Secondary | ICD-10-CM | POA: Diagnosis not present

## 2016-10-30 DIAGNOSIS — K922 Gastrointestinal hemorrhage, unspecified: Secondary | ICD-10-CM | POA: Diagnosis present

## 2016-10-30 DIAGNOSIS — D62 Acute posthemorrhagic anemia: Secondary | ICD-10-CM | POA: Diagnosis present

## 2016-10-30 DIAGNOSIS — E119 Type 2 diabetes mellitus without complications: Secondary | ICD-10-CM

## 2016-10-30 DIAGNOSIS — Z6828 Body mass index (BMI) 28.0-28.9, adult: Secondary | ICD-10-CM | POA: Diagnosis not present

## 2016-10-30 DIAGNOSIS — I10 Essential (primary) hypertension: Secondary | ICD-10-CM | POA: Diagnosis present

## 2016-10-30 DIAGNOSIS — Z886 Allergy status to analgesic agent status: Secondary | ICD-10-CM | POA: Diagnosis not present

## 2016-10-30 DIAGNOSIS — E785 Hyperlipidemia, unspecified: Secondary | ICD-10-CM | POA: Diagnosis present

## 2016-10-30 DIAGNOSIS — E669 Obesity, unspecified: Secondary | ICD-10-CM | POA: Diagnosis present

## 2016-10-30 DIAGNOSIS — K5731 Diverticulosis of large intestine without perforation or abscess with bleeding: Principal | ICD-10-CM | POA: Diagnosis present

## 2016-10-30 DIAGNOSIS — K5730 Diverticulosis of large intestine without perforation or abscess without bleeding: Secondary | ICD-10-CM | POA: Diagnosis not present

## 2016-10-30 LAB — PROTIME-INR
INR: 1.1
PROTHROMBIN TIME: 14.2 s (ref 11.4–15.2)

## 2016-10-30 LAB — BASIC METABOLIC PANEL
ANION GAP: 9 (ref 5–15)
BUN: 22 mg/dL — ABNORMAL HIGH (ref 6–20)
CALCIUM: 10.3 mg/dL (ref 8.9–10.3)
CO2: 27 mmol/L (ref 22–32)
Chloride: 105 mmol/L (ref 101–111)
Creatinine, Ser: 1.29 mg/dL — ABNORMAL HIGH (ref 0.44–1.00)
GFR calc non Af Amer: 38 mL/min — ABNORMAL LOW (ref >60)
GFR, EST AFRICAN AMERICAN: 44 mL/min — AB (ref >60)
Glucose, Bld: 138 mg/dL — ABNORMAL HIGH (ref 65–99)
Potassium: 3.3 mmol/L — ABNORMAL LOW (ref 3.5–5.1)
Sodium: 141 mmol/L (ref 135–145)

## 2016-10-30 LAB — TYPE AND SCREEN
ABO/RH(D): A POS
ANTIBODY SCREEN: NEGATIVE

## 2016-10-30 LAB — CBC WITH DIFFERENTIAL/PLATELET
BASOS ABS: 0.1 10*3/uL (ref 0–0.1)
BASOS PCT: 1 %
EOS ABS: 0.1 10*3/uL (ref 0–0.7)
EOS PCT: 2 %
HEMATOCRIT: 43.7 % (ref 35.0–47.0)
HEMOGLOBIN: 14.6 g/dL (ref 12.0–16.0)
LYMPHS ABS: 1.3 10*3/uL (ref 1.0–3.6)
Lymphocytes Relative: 17 %
MCH: 30.9 pg (ref 26.0–34.0)
MCHC: 33.3 g/dL (ref 32.0–36.0)
MCV: 92.5 fL (ref 80.0–100.0)
MONOS PCT: 7 %
Monocytes Absolute: 0.5 10*3/uL (ref 0.2–0.9)
Neutro Abs: 5.8 10*3/uL (ref 1.4–6.5)
Neutrophils Relative %: 73 %
Platelets: 227 10*3/uL (ref 150–440)
RBC: 4.72 MIL/uL (ref 3.80–5.20)
RDW: 14.6 % — ABNORMAL HIGH (ref 11.5–14.5)
WBC: 7.9 10*3/uL (ref 3.6–11.0)

## 2016-10-30 LAB — APTT: APTT: 29 s (ref 24–36)

## 2016-10-30 MED ORDER — SULFASALAZINE 500 MG PO TABS
500.0000 mg | ORAL_TABLET | Freq: Four times a day (QID) | ORAL | Status: DC
  Administered 2016-10-31 – 2016-11-02 (×8): 500 mg via ORAL
  Filled 2016-10-30 (×12): qty 1

## 2016-10-30 MED ORDER — HYDRALAZINE HCL 20 MG/ML IJ SOLN
INTRAMUSCULAR | Status: AC
  Administered 2016-10-30: 22:00:00
  Filled 2016-10-30: qty 1

## 2016-10-30 MED ORDER — QUINAPRIL HCL 10 MG PO TABS
40.0000 mg | ORAL_TABLET | Freq: Two times a day (BID) | ORAL | Status: DC
  Administered 2016-10-31 – 2016-11-02 (×5): 40 mg via ORAL
  Filled 2016-10-30 (×7): qty 4

## 2016-10-30 MED ORDER — ONDANSETRON HCL 4 MG PO TABS: 4.0000 mg | ORAL_TABLET | Freq: Four times a day (QID) | ORAL | Status: DC | PRN

## 2016-10-30 MED ORDER — ASPIRIN 81 MG PO CHEW: 81.0000 mg | CHEWABLE_TABLET | Freq: Every day | ORAL | Status: DC

## 2016-10-30 MED ORDER — INSULIN ASPART 100 UNIT/ML ~~LOC~~ SOLN: 0.0000 [IU] | Freq: Four times a day (QID) | SUBCUTANEOUS | Status: DC

## 2016-10-30 MED ORDER — AMLODIPINE BESYLATE 10 MG PO TABS
10.0000 mg | ORAL_TABLET | Freq: Every day | ORAL | Status: DC
  Administered 2016-10-31 – 2016-11-02 (×3): 10 mg via ORAL
  Filled 2016-10-30 (×3): qty 1

## 2016-10-30 MED ORDER — SODIUM CHLORIDE 0.9 % IV SOLN
INTRAVENOUS | Status: DC
  Administered 2016-10-30: via INTRAVENOUS

## 2016-10-30 MED ORDER — PRAVASTATIN SODIUM 40 MG PO TABS
40.0000 mg | ORAL_TABLET | Freq: Every day | ORAL | Status: DC
  Administered 2016-10-31 – 2016-11-01 (×2): 40 mg via ORAL
  Filled 2016-10-30 (×2): qty 1

## 2016-10-30 MED ORDER — FUROSEMIDE 40 MG PO TABS
40.0000 mg | ORAL_TABLET | Freq: Every day | ORAL | Status: DC
  Administered 2016-10-31 – 2016-11-02 (×3): 40 mg via ORAL
  Filled 2016-10-30 (×3): qty 1

## 2016-10-30 MED ORDER — ACETAMINOPHEN 325 MG PO TABS
650.0000 mg | ORAL_TABLET | Freq: Four times a day (QID) | ORAL | Status: DC | PRN
  Administered 2016-10-31: 650 mg via ORAL
  Filled 2016-10-30: qty 2

## 2016-10-30 MED ORDER — CLONIDINE HCL 0.1 MG PO TABS
0.3000 mg | ORAL_TABLET | Freq: Every day | ORAL | Status: DC
  Administered 2016-10-30 – 2016-11-01 (×3): 0.3 mg via ORAL
  Filled 2016-10-30 (×3): qty 3

## 2016-10-30 MED ORDER — ACETAMINOPHEN 650 MG RE SUPP: 650.0000 mg | Freq: Four times a day (QID) | RECTAL | Status: DC | PRN

## 2016-10-30 MED ORDER — HYDRALAZINE HCL 20 MG/ML IJ SOLN
10.0000 mg | INTRAMUSCULAR | Status: DC | PRN
  Administered 2016-10-30 – 2016-10-31 (×2): 10 mg via INTRAVENOUS
  Filled 2016-10-30: qty 1

## 2016-10-30 MED ORDER — ONDANSETRON HCL 4 MG/2ML IJ SOLN: 4.0000 mg | Freq: Four times a day (QID) | INTRAMUSCULAR | Status: DC | PRN

## 2016-10-30 MED ORDER — ATENOLOL 25 MG PO TABS
50.0000 mg | ORAL_TABLET | Freq: Every day | ORAL | Status: DC
  Administered 2016-10-31 – 2016-11-02 (×3): 50 mg via ORAL
  Filled 2016-10-30 (×3): qty 2

## 2016-10-30 NOTE — ED Notes (Signed)
Assisted pt to bathroom. Changed pt's bed. Connected back to monitor.

## 2016-10-30 NOTE — H&P (Signed)
[place] [facility] [place] NAME: Kaitlin Gomez    MR#:  [id number]  DATE OF BIRTH:  1935-01-07  DATE OF ADMISSION:  10/30/2016  PRIMARY CARE PHYSICIAN: Marguerita Merles, MD   REQUESTING/REFERRING PHYSICIAN: Alfred Levins, MD  CHIEF COMPLAINT:   Chief Complaint  Patient presents with  . Rectal Bleeding    HISTORY OF PRESENT ILLNESS:  Kaitlin Gomez  is a 82 y.o. female who presents with bleeding per rectum.  Patient has history of crohn's disease and diverticulosis/diverticulitis with lower gi bleed in past.  Hemoglobin is stable here.  Patient states she has had 4 bowel movements, grossly bloody, today, with red blood and clots.  Hospitalists were called for admission.  PAST MEDICAL HISTORY:   Past Medical History:  Diagnosis Date  . Crohn's disease ([place])   . Diabetes mellitus without complication ([place])   . Hyperlipemia   . Hypertension     PAST SURGICAL HISTORY:   Past Surgical History:  Procedure Laterality Date  . COLONOSCOPY WITH PROPOFOL N/A 01/05/2015   Procedure: COLONOSCOPY WITH PROPOFOL;  Surgeon: Lollie Sails, MD;  Location: [facility] ENDOSCOPY;  Service: Endoscopy;  Laterality: N/A;    SOCIAL HISTORY:   Social History  Substance Use Topics  . Smoking status: Never Smoker  . Smokeless tobacco: Not on file  . Alcohol use No    FAMILY HISTORY:   Family History  Problem Relation Age of Onset  . Hypertension Father     DRUG ALLERGIES:   Allergies  Allergen Reactions  . Advil [Ibuprofen] Rash    MEDICATIONS AT HOME:   Prior to Admission medications   Medication Sig Start Date End Date Taking? Authorizing Provider  amLODipine (NORVASC) 10 MG tablet Take 10 mg by mouth daily.   Yes [provider]  aspirin 81 MG chewable tablet Chew 81 mg by mouth daily.   Yes [provider]  atenolol (TENORMIN) 50 MG tablet Take 50 mg by mouth daily.   Yes [provider]  calcium-vitamin D (OSCAL  500/200 D-3) 500-200 MG-UNIT per tablet Take 1 tablet by mouth 2 (two) times daily.   Yes [provider]  cloNIDine (CATAPRES) 0.3 MG tablet Take 0.3 mg by mouth at bedtime.    Yes [provider]  furosemide (LASIX) 40 MG tablet Take 40 mg by mouth daily.   Yes [provider]  lovastatin (MEVACOR) 40 MG tablet Take 40 mg by mouth at bedtime.   Yes [provider]  metFORMIN (GLUCOPHAGE-XR) 500 MG 24 hr tablet Take 500 mg by mouth daily. 10/09/16  Yes [provider]  quinapril (ACCUPRIL) 40 MG tablet Take 40 mg by mouth 2 (two) times daily.   Yes [provider]  sulfaSALAzine (AZULFIDINE) 500 MG tablet Take 500 mg by mouth 4 (four) times daily. 10/09/16  Yes [provider]  HYDROcodone-acetaminophen (NORCO/VICODIN) 5-325 MG per tablet Take 1 tablet by mouth every 4 (four) hours as needed for moderate pain. Patient not taking: Reported on 10/30/2016 01/23/15   Harvest Dark, MD  mesalamine (LIALDA) 1.2 G EC tablet Take 4 tablets (4.8 g total) by mouth daily with breakfast. Patient not taking: Reported on 10/30/2016 01/07/15   Max Sane, MD    REVIEW OF SYSTEMS:  Review of Systems  Constitutional: Negative for chills, fever, malaise/fatigue and weight loss.  HENT: Negative for ear pain, hearing loss and tinnitus.   Eyes: Negative for blurred vision, double vision, pain and redness.  Respiratory: Negative for cough, hemoptysis and shortness of breath.   Cardiovascular: Negative for chest pain, palpitations, orthopnea and leg swelling.  Gastrointestinal: Positive for blood in stool. Negative for abdominal pain, constipation, diarrhea, nausea and vomiting.  Genitourinary: Negative for dysuria, frequency and hematuria.  Musculoskeletal: Negative for back pain, joint pain and neck pain.  Skin:       No acne, rash, or lesions  Neurological: Negative for dizziness, tremors, focal weakness and weakness.  Endo/Heme/Allergies: Negative  for polydipsia. Does not bruise/bleed easily.  Psychiatric/Behavioral: Negative for depression. The patient is not nervous/anxious and does not have insomnia.      VITAL SIGNS:   Vitals:   10/30/16 1917 10/30/16 1918  BP:  (!) 194/73  Pulse: 77   Resp: 20   Temp: 98.3 F (36.8 C)   TempSrc: Oral   SpO2: 96%   Weight: 83.9 kg (185 lb)   Height: 5\' 7"  (1.702 m)    Wt Readings from Last 3 Encounters:  10/30/16 83.9 kg (185 lb)  01/23/15 91.2 kg (201 lb)  12/31/14 94.9 kg (209 lb 5 oz)    PHYSICAL EXAMINATION:  Physical Exam  Vitals reviewed. Constitutional: She is oriented to person, place, and time. She appears well-developed and well-nourished. No distress.  HENT:  Head: Normocephalic and atraumatic.  Mouth/Throat: Oropharynx is clear and moist.  Eyes: Conjunctivae and EOM are normal. Pupils are equal, round, and reactive to light. No scleral icterus.  Neck: Normal range of motion. Neck supple. No JVD present. No thyromegaly present.  Cardiovascular: Normal rate, regular rhythm and intact distal pulses.  Exam reveals no gallop and no friction rub.   No murmur heard. Respiratory: Effort normal and breath sounds normal. No respiratory distress. She has no wheezes. She has no rales.  GI: Soft. Bowel sounds are normal. She exhibits no distension. There is no tenderness.  Musculoskeletal: Normal range of motion. She exhibits no edema.  No arthritis, no gout  Lymphadenopathy:    She has no cervical adenopathy.  Neurological: She is alert and oriented to person, place, and time. No cranial nerve deficit.  No dysarthria, no aphasia  Skin: Skin is warm and dry. No rash noted. No erythema.  Psychiatric: She has a normal mood and affect. Her behavior is normal. Judgment and thought content normal.    LABORATORY PANEL:   CBC  Recent Labs Lab 10/30/16 1941  WBC 7.9  HGB 14.6  HCT 43.7  PLT 227    ------------------------------------------------------------------------------------------------------------------  Chemistries   Recent Labs Lab 10/30/16 1941  NA 141  K 3.3*  CL 105  CO2 27  GLUCOSE 138*  BUN 22*  CREATININE 1.29*  CALCIUM 10.3   ------------------------------------------------------------------------------------------------------------------  Cardiac Enzymes No results for input(s): TROPONINI in the last 168 hours. ------------------------------------------------------------------------------------------------------------------  RADIOLOGY:  No results found.  EKG:   Orders placed or performed in visit on 01/23/15  . EKG 12-Lead    IMPRESSION AND PLAN:  Principal Problem:   Lower GI bleed - unclear etiology, hemoglobin stable, will monitor q6 hours.  GI consult Active Problems:   Crohn's disease ([place]) - unclear if related to GI bleed, possible due to diverticular bleed.  GI consult as above   Diabetes ([place]) - SSI with glucose checks   HTN (hypertension) - continue home meds  All the records are reviewed and case discussed with ED provider. Management plans discussed with the patient and/or family.  DVT PROPHYLAXIS: Mechanical only  GI PROPHYLAXIS: None  ADMISSION STATUS: Inpatient  CODE  STATUS: Full Code Status History    Date Active Date Inactive Code Status Order ID Comments User Context   12/31/2014  9:52 AM 01/07/2015  5:53 PM Full Code [id number]  Bettey Costa, MD Inpatient      TOTAL TIME TAKING CARE OF THIS PATIENT: 45 minutes.   Serrena Linderman FIELDING 10/30/2016, 8:54 PM  [facility]  Office  [phone]  CC: Primary care physician; Marguerita Merles, MD  Note:  This document was prepared using Dragon voice recognition software and may include unintentional dictation errors.

## 2016-10-30 NOTE — ED Provider Notes (Signed)
[facility] Emergency Department Provider Note  ____________________________________________  Time seen: Approximately 7:27 PM  I have reviewed the triage vital signs and the nursing notes.   HISTORY  Chief Complaint Rectal Bleeding   HPI Kaitlin Gomez is a 82 y.o. female with a history of Crohn's disease, diverticulosis, hypertension, hyperlipidemia, diabeteswho presents for evaluation of rectal bleeding. Patient reports 4 episodes of BRBPR since earlier this afternoon. She reports large amount of red blood and clots in the toilet. Has had 3 other similar episodes in the past. No abdominal pain, rectal pain, not on blood thinners, no N/V, no CP, no SOB, no dizziness. Last colonoscopy was in 2016 showing diverticulosis.   Past Medical History:  Diagnosis Date  . Crohn's disease   . Diabetes mellitus without complication   . Hyperlipemia   . Hypertension     Patient Active Problem List   Diagnosis Date Noted  . Colitis 12/31/2014    Past Surgical History:  Procedure Laterality Date  . COLONOSCOPY WITH PROPOFOL N/A 01/05/2015   Procedure: COLONOSCOPY WITH PROPOFOL;  Surgeon: Lollie Sails, MD;  Location: [facility] ENDOSCOPY;  Service: Endoscopy;  Laterality: N/A;    Prior to Admission medications   Medication Sig Start Date End Date Taking? Authorizing Provider  amLODipine (NORVASC) 10 MG tablet Take 10 mg by mouth daily.   Yes [provider]  aspirin 81 MG chewable tablet Chew 81 mg by mouth daily.   Yes [provider]  atenolol (TENORMIN) 50 MG tablet Take 50 mg by mouth daily.   Yes [provider]  calcium-vitamin D (OSCAL 500/200 D-3) 500-200 MG-UNIT per tablet Take 1 tablet by mouth 2 (two) times daily.   Yes [provider]  cloNIDine (CATAPRES) 0.3 MG tablet Take 0.3 mg by mouth at bedtime.    Yes [provider]  furosemide (LASIX) 40 MG tablet Take 40 mg by mouth daily.   Yes [provider]  lovastatin (MEVACOR) 40 MG tablet Take 40 mg by mouth at bedtime.   Yes [provider]  metFORMIN (GLUCOPHAGE-XR) 500 MG 24 hr tablet Take 500 mg by mouth daily. 10/09/16  Yes [provider]  quinapril (ACCUPRIL) 40 MG tablet Take 40 mg by mouth 2 (two) times daily.   Yes [provider]  sulfaSALAzine (AZULFIDINE) 500 MG tablet Take 500 mg by mouth 4 (four) times daily. 10/09/16  Yes [provider]  HYDROcodone-acetaminophen (NORCO/VICODIN) 5-325 MG per tablet Take 1 tablet by mouth every 4 (four) hours as needed for moderate pain. Patient not taking: Reported on 10/30/2016 01/23/15   Harvest Dark, MD  mesalamine (LIALDA) 1.2 G EC tablet Take 4 tablets (4.8 g total) by mouth daily with breakfast. Patient not taking: Reported on 10/30/2016 01/07/15   Max Sane, MD    Allergies Advil [ibuprofen]  No family history on file.  Social History Social History  Substance Use Topics  . Smoking status: Never Smoker  . Smokeless tobacco: Not on file  . Alcohol use No    Review of Systems  Constitutional: Negative for fever. Eyes: Negative for visual changes. ENT: Negative for sore throat. Neck: No neck pain  Cardiovascular: Negative for chest pain. Respiratory: Negative for shortness of breath. Gastrointestinal: Negative for abdominal pain, vomiting or diarrhea. Genitourinary: Negative for dysuria. + rectal bleeding Musculoskeletal: Negative for back pain. Skin: Negative for rash. Neurological: Negative for headaches, weakness or numbness. Psych: No SI or HI  ____________________________________________   PHYSICAL EXAM:  VITAL SIGNS: ED Triage Vitals  Enc Vitals Group     BP 10/30/16 1918 (!) 194/73     Pulse Rate 10/30/16 1917 77     Resp 10/30/16 1917 20     Temp 10/30/16 1917 98.3 F (36.8 C)     Temp Source 10/30/16 1917 Oral     SpO2 10/30/16 1917 96 %     Weight 10/30/16 1917 185 lb (83.9 kg)     Height 10/30/16  1917 5\' 7"  (1.702 m)     Head Circumference --      Peak Flow --      Pain Score 10/30/16 1916 0     Pain Loc --      Pain Edu? --      Excl. in [place]? --     Constitutional: Alert and oriented. Well appearing and in no apparent distress. HEENT:      Head: Normocephalic and atraumatic.         Eyes: Conjunctivae are normal. Sclera is non-icteric.       Mouth/Throat: Mucous membranes are moist.       Neck: Supple with no signs of meningismus. Cardiovascular: Regular rate and rhythm. No murmurs, gallops, or rubs. 2+ symmetrical distal pulses are present in all extremities. No JVD. Respiratory: Normal respiratory effort. Lungs are clear to auscultation bilaterally. No wheezes, crackles, or rhonchi.  Gastrointestinal: Soft, non tender, and non distended with positive bowel sounds. No rebound or guarding. Genitourinary: No CVA tenderness. Rectal inspection with no hemorrhoid or fissure. Large amount of blood in the toilet with blood clots  Musculoskeletal: Nontender with normal range of motion in all extremities. No edema, cyanosis, or erythema of extremities. Neurologic: Normal speech and language. Face is symmetric. Moving all extremities. No gross focal neurologic deficits are appreciated. Skin: Skin is warm, dry and intact. No rash noted. Psychiatric: Mood and affect are normal. Speech and behavior are normal.  ____________________________________________   LABS (all labs ordered are listed, but only abnormal results are displayed)  Labs Reviewed  CBC WITH DIFFERENTIAL/PLATELET - Abnormal; Notable for the following:       Result Value   RDW 14.6 (*)    All other components within normal limits  BASIC METABOLIC PANEL  C-REACTIVE PROTEIN  PROTIME-INR  APTT  TYPE AND SCREEN   ____________________________________________  EKG  none  ____________________________________________  RADIOLOGY  none  ____________________________________________   PROCEDURES  Procedure(s)  performed: None Procedures Critical Care performed:  None ____________________________________________   INITIAL IMPRESSION / ASSESSMENT AND PLAN / ED COURSE   82 y.o. female with a history of Crohn's disease, diverticulosis, hypertension, hyperlipidemia, diabeteswho presents for evaluation of rectal bleeding since earlier this afternoon. Two episodes in the ED. Stool in the toilet with large amount of blood and clots, rectal exam otherwise normal with no fissure or hemorrhoids. Vitals stable.  hgb stable at 14.6. Type and screen sent. Will monitor closely and admit to Hospitalist service. Presentation concerning for diverticular bleed. Less likely Crohns flare with no abdominal pain, normal WBC.      Pertinent labs & imaging results that were available during my care of the patient were reviewed by me and considered in my medical decision making (see chart for details).    ____________________________________________   FINAL CLINICAL IMPRESSION(S) / ED DIAGNOSES  Final diagnoses:  Lower GI bleed      NEW MEDICATIONS STARTED DURING THIS VISIT:  New Prescriptions   No medications on file     Note:  This document was prepared using Dragon voice recognition software and may include unintentional dictation errors.    Rudene Re, MD 10/30/16 2029

## 2016-10-31 ENCOUNTER — Encounter: Payer: Self-pay | Admitting: *Deleted

## 2016-10-31 LAB — HEMOGLOBIN: HEMOGLOBIN: 10.5 g/dL — AB (ref 12.0–16.0)

## 2016-10-31 LAB — CBC
HEMATOCRIT: 38.1 % (ref 35.0–47.0)
Hemoglobin: 12.9 g/dL (ref 12.0–16.0)
MCH: 31.1 pg (ref 26.0–34.0)
MCHC: 33.7 g/dL (ref 32.0–36.0)
MCV: 92.2 fL (ref 80.0–100.0)
Platelets: 200 10*3/uL (ref 150–440)
RBC: 4.13 MIL/uL (ref 3.80–5.20)
RDW: 14.6 % — ABNORMAL HIGH (ref 11.5–14.5)
WBC: 8.5 10*3/uL (ref 3.6–11.0)

## 2016-10-31 LAB — BASIC METABOLIC PANEL
ANION GAP: 8 (ref 5–15)
BUN: 22 mg/dL — ABNORMAL HIGH (ref 6–20)
CHLORIDE: 108 mmol/L (ref 101–111)
CO2: 27 mmol/L (ref 22–32)
Calcium: 9.5 mg/dL (ref 8.9–10.3)
Creatinine, Ser: 0.91 mg/dL (ref 0.44–1.00)
GFR, EST NON AFRICAN AMERICAN: 58 mL/min — AB (ref >60)
Glucose, Bld: 110 mg/dL — ABNORMAL HIGH (ref 65–99)
POTASSIUM: 3.3 mmol/L — AB (ref 3.5–5.1)
SODIUM: 143 mmol/L (ref 135–145)

## 2016-10-31 LAB — GLUCOSE, CAPILLARY
GLUCOSE-CAPILLARY: 110 mg/dL — AB (ref 65–99)
Glucose-Capillary: 106 mg/dL — ABNORMAL HIGH (ref 65–99)
Glucose-Capillary: 108 mg/dL — ABNORMAL HIGH (ref 65–99)
Glucose-Capillary: 92 mg/dL (ref 65–99)
Glucose-Capillary: 93 mg/dL (ref 65–99)

## 2016-10-31 LAB — C-REACTIVE PROTEIN: CRP: 1.2 mg/dL — ABNORMAL HIGH (ref <1.0)

## 2016-10-31 MED ORDER — INSULIN ASPART 100 UNIT/ML ~~LOC~~ SOLN
0.0000 [IU] | Freq: Three times a day (TID) | SUBCUTANEOUS | Status: DC
  Administered 2016-11-01: 1 [IU] via SUBCUTANEOUS
  Filled 2016-10-31: qty 1

## 2016-10-31 NOTE — Progress Notes (Signed)
[place] at [place] NAME: Kaitlin Gomez    MR#:  [id number]  DATE OF BIRTH:  [birth year]  SUBJECTIVE:  CHIEF COMPLAINT:   Chief Complaint  Patient presents with  . Rectal Bleeding   Had blood in stool today morning. No chest pain or shortness of breath  REVIEW OF SYSTEMS:    Review of Systems  Constitutional: Positive for malaise/fatigue. Negative for chills and fever.  HENT: Negative for sore throat.   Eyes: Negative for blurred vision, double vision and pain.  Respiratory: Negative for cough, hemoptysis, shortness of breath and wheezing.   Cardiovascular: Negative for chest pain, palpitations, orthopnea and leg swelling.  Gastrointestinal: Positive for blood in stool. Negative for abdominal pain, constipation, diarrhea, heartburn, nausea and vomiting.  Genitourinary: Negative for dysuria and hematuria.  Musculoskeletal: Negative for back pain and joint pain.  Skin: Negative for rash.  Neurological: Positive for dizziness and weakness. Negative for sensory change, speech change, focal weakness and headaches.  Endo/Heme/Allergies: Does not bruise/bleed easily.  Psychiatric/Behavioral: Negative for depression. The patient is not nervous/anxious.     DRUG ALLERGIES:   Allergies  Allergen Reactions  . Advil [Ibuprofen] Rash    VITALS:  Blood pressure (!) 135/50, pulse 62, temperature 98 F (36.7 C), resp. rate 18, height 5\' 7"  (1.702 m), weight 83.9 kg (185 lb), SpO2 97 %.  PHYSICAL EXAMINATION:   Physical Exam  GENERAL:  82 y.o.-year-old patient lying in the bed with no acute distress.  EYES: Pupils equal, round, reactive to light and accommodation. No scleral icterus. Extraocular muscles intact.  HEENT: Head atraumatic, normocephalic. Oropharynx and nasopharynx clear.  NECK:  Supple, no jugular venous distention. No thyroid enlargement, no tenderness.  LUNGS: Normal breath sounds bilaterally, no wheezing, rales, rhonchi. No  use of accessory muscles of respiration.  CARDIOVASCULAR: S1, S2 normal. No murmurs, rubs, or gallops.  ABDOMEN: Soft, nontender, nondistended. Bowel sounds present. No organomegaly or mass.  EXTREMITIES: No cyanosis, clubbing or edema b/l.    NEUROLOGIC: Cranial nerves II through XII are intact. No focal Motor or sensory deficits b/l.   PSYCHIATRIC: The patient is alert and oriented x 3.  SKIN: No obvious rash, lesion, or ulcer.   LABORATORY PANEL:   CBC  Recent Labs Lab 10/31/16 0137  WBC 8.5  HGB 12.9  HCT 38.1  PLT 200   ------------------------------------------------------------------------------------------------------------------ Chemistries   Recent Labs Lab 10/31/16 0137  NA 143  K 3.3*  CL 108  CO2 27  GLUCOSE 110*  BUN 22*  CREATININE 0.91  CALCIUM 9.5   ------------------------------------------------------------------------------------------------------------------  Cardiac Enzymes No results for input(s): TROPONINI in the last 168 hours. ------------------------------------------------------------------------------------------------------------------  RADIOLOGY:  No results found.   ASSESSMENT AND PLAN:   * Bright red blood per rectum likely diverticular bleeding. Serial hemoglobin. Stop aspirin. Start clear liquids. GI consult Transfuse if hemoglobin less than 8  * Crohn's disease. No abdominal tenderness. Continue home medications  * Diabetes mellitus. Sliding scale insulin.  * Hypertension. We'll continue home medications  * DVT prophylaxis with SCDs  All the records are reviewed and case discussed with Care Management/Social Workerr. Management plans discussed with the patient, family and they are in agreement.  CODE STATUS: FULL CODE  TOTAL TIME TAKING CARE OF THIS PATIENT: 30 minutes.   POSSIBLE D/C IN 1-2 DAYS, DEPENDING ON CLINICAL CONDITION.  Hillary Bow R M.D on 10/31/2016 at 1:07 PM  Between 7am to 6pm - Pager -  [phone]  After 6pm  go to [email or web address] - password [email or web address] [facility]  [facility]  Office  [phone]  CC: Primary care physician; Marguerita Merles, MD  Note: This dictation was prepared with Dragon dictation along with smaller phrase technology. Any transcriptional errors that result from this process are unintentional.

## 2016-10-31 NOTE — Progress Notes (Signed)
[facility] received a consult for AD. Alden met with Pt, provided Advanced Directive and Eduction to the Pt. Pt stated she needs time to review the AD and will contact [place] when ready to complete AD. Adams informed Pt that [facility] services are available as needed.    10/31/16 1100  Clinical Encounter Type  Visited With Patient  Visit Type Initial;Spiritual support;Other (Comment)  Referral From Nurse  Consult/Referral To Chaplain  Spiritual Encounters  Spiritual Needs Literature;Other (Comment)

## 2016-11-01 ENCOUNTER — Inpatient Hospital Stay: Payer: Medicare Other

## 2016-11-01 ENCOUNTER — Encounter: Payer: Self-pay | Admitting: Radiology

## 2016-11-01 DIAGNOSIS — K625 Hemorrhage of anus and rectum: Secondary | ICD-10-CM

## 2016-11-01 LAB — HEMOGLOBIN
Hemoglobin: 9.1 g/dL — ABNORMAL LOW (ref 12.0–16.0)
Hemoglobin: 9.6 g/dL — ABNORMAL LOW (ref 12.0–16.0)

## 2016-11-01 LAB — GLUCOSE, CAPILLARY
GLUCOSE-CAPILLARY: 84 mg/dL (ref 65–99)
Glucose-Capillary: 95 mg/dL (ref 65–99)
Glucose-Capillary: 124 mg/dL — ABNORMAL HIGH (ref 65–99)
Glucose-Capillary: 113 mg/dL — ABNORMAL HIGH (ref 65–99)

## 2016-11-01 MED ORDER — SODIUM CHLORIDE 0.9 % IV SOLN
INTRAVENOUS | Status: DC
  Administered 2016-11-02 (×2): via INTRAVENOUS

## 2016-11-01 MED ORDER — TECHNETIUM TC 99M-LABELED RED BLOOD CELLS IV KIT
20.0000 | PACK | Freq: Once | INTRAVENOUS | Status: AC | PRN
  Administered 2016-11-01: 20.532 via INTRAVENOUS

## 2016-11-01 MED ORDER — PEG 3350-KCL-NA BICARB-NACL 420 G PO SOLR
4000.0000 mL | Freq: Once | ORAL | Status: AC
  Administered 2016-11-01: 4000 mL via ORAL
  Filled 2016-11-01: qty 4000

## 2016-11-01 NOTE — Progress Notes (Signed)
[place] at [place] NAME: Kaitlin Gomez    MR#:  [id number]  DATE OF BIRTH:  11-03-1934  SUBJECTIVE:  CHIEF COMPLAINT:   Chief Complaint  Patient presents with  . Rectal Bleeding   Had a large bloody bowel movement today with frank blood. Significant drop in hemoglobin. No abdominal pain. Afebrile.  REVIEW OF SYSTEMS:    Review of Systems  Constitutional: Positive for malaise/fatigue. Negative for chills and fever.  HENT: Negative for sore throat.   Eyes: Negative for blurred vision, double vision and pain.  Respiratory: Negative for cough, hemoptysis, shortness of breath and wheezing.   Cardiovascular: Negative for chest pain, palpitations, orthopnea and leg swelling.  Gastrointestinal: Positive for blood in stool. Negative for abdominal pain, constipation, diarrhea, heartburn, nausea and vomiting.  Genitourinary: Negative for dysuria and hematuria.  Musculoskeletal: Negative for back pain and joint pain.  Skin: Negative for rash.  Neurological: Positive for dizziness and weakness. Negative for sensory change, speech change, focal weakness and headaches.  Endo/Heme/Allergies: Does not bruise/bleed easily.  Psychiatric/Behavioral: Negative for depression. The patient is not nervous/anxious.     DRUG ALLERGIES:   Allergies  Allergen Reactions  . Advil [Ibuprofen] Rash    VITALS:  Blood pressure (!) 159/62, pulse 63, temperature 98.5 F (36.9 C), temperature source Oral, resp. rate 18, height 5\' 7"  (1.702 m), weight 83.9 kg (185 lb), SpO2 99 %.  PHYSICAL EXAMINATION:   Physical Exam  GENERAL:  82 y.o.-year-old patient lying in the bed with no acute distress.  EYES: Pupils equal, round, reactive to light and accommodation. No scleral icterus. Extraocular muscles intact.  HEENT: Head atraumatic, normocephalic. Oropharynx and nasopharynx clear.  NECK:  Supple, no jugular venous distention. No thyroid enlargement, no tenderness.   LUNGS: Normal breath sounds bilaterally, no wheezing, rales, rhonchi. No use of accessory muscles of respiration.  CARDIOVASCULAR: S1, S2 normal. No murmurs, rubs, or gallops.  ABDOMEN: Soft, nontender, nondistended. Bowel sounds present. No organomegaly or mass.  EXTREMITIES: No cyanosis, clubbing or edema b/l.    NEUROLOGIC: Cranial nerves II through XII are intact. No focal Motor or sensory deficits b/l.   PSYCHIATRIC: The patient is alert and oriented x 3.  SKIN: No obvious rash, lesion, or ulcer.   LABORATORY PANEL:   CBC  Recent Labs Lab 10/31/16 0137  11/01/16 0523  WBC 8.5  --   --   HGB 12.9  < > 9.1*  HCT 38.1  --   --   PLT 200  --   --   < > = values in this interval not displayed. ------------------------------------------------------------------------------------------------------------------ Chemistries   Recent Labs Lab 10/31/16 0137  NA 143  K 3.3*  CL 108  CO2 27  GLUCOSE 110*  BUN 22*  CREATININE 0.91  CALCIUM 9.5   ------------------------------------------------------------------------------------------------------------------  Cardiac Enzymes No results for input(s): TROPONINI in the last 168 hours. ------------------------------------------------------------------------------------------------------------------  RADIOLOGY:  No results found.   ASSESSMENT AND PLAN:   * Bright red blood per rectum With acute blood loss anemia Continues to have significant bleeding. Acute blood loss anemia. Hemoglobin has dropped from 14 to 9 today. Just had a large bowel movement with only blood. We'll get a stat bleeding scan. Discussed with GI Dr. Vicente Males. A bleeding scan is positive we will consult vascular surgery for embolization. Transfuse blood if any further drop. Etiology unclear at this time. Could be diverticular or due to  stricture that was found during last colonoscopy. Check serial  hemoglobin  * Crohn's disease. No abdominal tenderness.  Continue home medications  * Diabetes mellitus. Sliding scale insulin.  * Hypertension. We'll continue home medications  * DVT prophylaxis with SCDs  All the records are reviewed and case discussed with Care Management/Social Workerr. Management plans discussed with the patient, family and they are in agreement.  CODE STATUS: FULL CODE  TOTAL CC TIME TAKING CARE OF THIS PATIENT: 35 minutes.   POSSIBLE D/C IN 1-2 DAYS, DEPENDING ON CLINICAL CONDITION.  Hillary Bow R M.D on 11/01/2016 at 11:15 AM  Between 7am to 6pm - Pager - [phone]  After 6pm go to [email or web address] - password [email or web address] [facility]  [facility] [place]  Office  [phone]  CC: Primary care physician; Marguerita Merles, MD  Note: This dictation was prepared with Dragon dictation along with smaller phrase technology. Any transcriptional errors that result from this process are unintentional.

## 2016-11-01 NOTE — Consult Note (Signed)
Jonathon Bellows MD, MRCP(U.K) [address]  [place]  [place], [place] [address]  Main: [phone]  Fax: [phone]  Consultation  Referring Provider:    Dr Darvin Neighbours  Primary Care Physician:  Marguerita Merles, MD Primary Gastroenterologist:  [facility]  Reason for Consultation:     Rectal bleed   Date of Admission:  10/30/2016 Date of Consultation:  11/01/2016         HPI:   Kaitlin Gomez is a 82 y.o. female who sees [facility] as an out patient , last visit in 01/2016 and as per their office note has had Crohns disease . Per last office note there was mention of an anal stricture and inflammation in the sigmoid colon per last colonoscopy . She had been on sulfasalazine  . Unclear location of her crohns disease per last office note. CT scan in 12/2014 showed no attributable findings but patient has colonic diverticulosis  Pan colonic .Stricture seen in the recto sigmoid area too. A prior CT scan in 2016 also suggested a left hemicolectomy .   This admission she comes in with rectal bleeding which began 2 days back all of a sudden , painless, continued on and off till this morning . Denies any NSAID use, says she had a left hemicolectomy back in the 90's and has had crohns since the 80's , no recent steroid use.    Past Medical History:  Diagnosis Date  . Crohn's disease ([place])   . Diabetes mellitus without complication ([place])   . Hyperlipemia   . Hypertension     Past Surgical History:  Procedure Laterality Date  . COLONOSCOPY WITH PROPOFOL N/A 01/05/2015   Procedure: COLONOSCOPY WITH PROPOFOL;  Surgeon: Lollie Sails, MD;  Location: [facility] ENDOSCOPY;  Service: Endoscopy;  Laterality: N/A;    Prior to Admission medications   Medication Sig Start Date End Date Taking? Authorizing Provider  amLODipine (NORVASC) 10 MG tablet Take 10 mg by mouth daily.   Yes [provider]  aspirin 81 MG chewable tablet Chew 81 mg by mouth daily.   Yes [provider]  atenolol  (TENORMIN) 50 MG tablet Take 50 mg by mouth daily.   Yes [provider]  calcium-vitamin D (OSCAL 500/200 D-3) 500-200 MG-UNIT per tablet Take 1 tablet by mouth 2 (two) times daily.   Yes [provider]  cloNIDine (CATAPRES) 0.3 MG tablet Take 0.3 mg by mouth at bedtime.    Yes [provider]  furosemide (LASIX) 40 MG tablet Take 40 mg by mouth daily.   Yes [provider]  lovastatin (MEVACOR) 40 MG tablet Take 40 mg by mouth at bedtime.   Yes [provider]  metFORMIN (GLUCOPHAGE-XR) 500 MG 24 hr tablet Take 500 mg by mouth daily. 10/09/16  Yes [provider]  quinapril (ACCUPRIL) 40 MG tablet Take 40 mg by mouth 2 (two) times daily.   Yes [provider]  sulfaSALAzine (AZULFIDINE) 500 MG tablet Take 500 mg by mouth 4 (four) times daily. 10/09/16  Yes [provider]  HYDROcodone-acetaminophen (NORCO/VICODIN) 5-325 MG per tablet Take 1 tablet by mouth every 4 (four) hours as needed for moderate pain. Patient not taking: Reported on 10/30/2016 01/23/15   Harvest Dark, MD  mesalamine (LIALDA) 1.2 G EC tablet Take 4 tablets (4.8 g total) by mouth daily with breakfast. Patient not taking: Reported on 10/30/2016 01/07/15   Max Sane, MD    Family History  Problem Relation Age of  Onset  . Hypertension Father      Social History  Substance Use Topics  . Smoking status: Never Smoker  . Smokeless tobacco: Never Used  . Alcohol use No    Allergies as of 10/30/2016 - Review Complete 10/30/2016  Allergen Reaction Noted  . Advil [ibuprofen] Rash 12/31/2014    Review of Systems:    All systems reviewed and negative except where noted in HPI.   Physical Exam:  Vital signs in last 24 hours: Temp:  [98 F (36.7 C)-98.9 F (37.2 C)] 98.5 F (36.9 C) (06/07 0815) Pulse Rate:  [58-69] 63 (06/07 0815) Resp:  [18-20] 18 (06/07 0815) BP: (106-172)/(42-67) 166/53 (06/07 1009) SpO2:  [97 %-100 %] 99 % (06/07 0815) Last  BM Date: 11/01/16 General:   Pleasant, cooperative in NAD Head:  Normocephalic and atraumatic. Eyes:   No icterus.   Conjunctiva pink. PERRLA. Ears:  Normal auditory acuity. Neck:  Supple; no masses or thyroidomegaly Lungs: Respirations even and unlabored. Lungs clear to auscultation bilaterally.   No wheezes, crackles, or rhonchi.  Heart:  Regular rate and rhythm;  Without murmur, clicks, rubs or gallops Abdomen:  Central abdominal scar Soft, nondistended, nontender. Normal bowel sounds. No appreciable masses or hepatomegaly.  No rebound or guarding.  Rectal:  Not performed. Extremities:  Without edema, cyanosis or clubbing. Neurologic:  Alert and oriented x3;  grossly normal neurologically. Cervical Nodes:  No significant cervical adenopathy. Psych:  Alert and cooperative. Normal affect.  LAB RESULTS:  Recent Labs  10/30/16 1941 10/31/16 0137 10/31/16 1439 11/01/16 0523  WBC 7.9 8.5  --   --   HGB 14.6 12.9 10.5* 9.1*  HCT 43.7 38.1  --   --   PLT 227 200  --   --    BMET  Recent Labs  10/30/16 1941 10/31/16 0137  NA 141 143  K 3.3* 3.3*  CL 105 108  CO2 27 27  GLUCOSE 138* 110*  BUN 22* 22*  CREATININE 1.29* 0.91  CALCIUM 10.3 9.5   LFT No results for input(s): PROT, ALBUMIN, AST, ALT, ALKPHOS, BILITOT, BILIDIR, IBILI in the last 72 hours. PT/INR  Recent Labs  10/30/16 1941  LABPROT 14.2  INR 1.10    STUDIES: No results found.    Impression / Plan:   Kaitlin Gomez is a 82 y.o. y/o female with prior history of crohn's disease- unclear of any prior small bowel involvement, no imaging of small bowel available. There is some mention of a left hemi colectomy which am unsure of the reason. Last colonoscopy report mentions a 10 cm long stricture in proximity to the rectum along with an anal stricture.  Today she comes in with rectal bleeding  With Hb dropped from 12.9---> 9 grams. Dr Darvin Neighbours called me and informed that she is having active rectal bleeding.    Plan  1. Bleeding RBC scan to determine location of active bleed. If negative then plan for colonoscopy tomorrow. Bleeding could be a diverticular bleed or from active crohns disease   2. Colonoscopy tomorrow if bleeding scan is negative for active bleeding .   3. Monitor CBC and transfuse as needed   4. As an out patient would benefit from a MR enterogram to determine the extent and severity of her crohns disease   Thank you for involving me in the care of this patient.      LOS: 2 days   Jonathon Bellows, MD  11/01/2016, 10:24 AM

## 2016-11-02 ENCOUNTER — Inpatient Hospital Stay: Payer: Medicare Other | Admitting: Anesthesiology

## 2016-11-02 ENCOUNTER — Encounter: Payer: Self-pay | Admitting: *Deleted

## 2016-11-02 ENCOUNTER — Encounter
Admission: EM | Disposition: A | Discharge status: Home / Self Care | Payer: Self-pay | Source: Home / Self Care | Attending: Internal Medicine

## 2016-11-02 DIAGNOSIS — K624 Stenosis of anus and rectum: Secondary | ICD-10-CM

## 2016-11-02 DIAGNOSIS — K5090 Crohn's disease, unspecified, without complications: Secondary | ICD-10-CM

## 2016-11-02 DIAGNOSIS — K5730 Diverticulosis of large intestine without perforation or abscess without bleeding: Secondary | ICD-10-CM

## 2016-11-02 HISTORY — PX: COLONOSCOPY WITH PROPOFOL: SHX5780

## 2016-11-02 LAB — HEMOGLOBIN: Hemoglobin: 8.7 g/dL — ABNORMAL LOW (ref 12.0–16.0)

## 2016-11-02 LAB — BASIC METABOLIC PANEL
ANION GAP: 8 (ref 5–15)
BUN: 16 mg/dL (ref 6–20)
CHLORIDE: 107 mmol/L (ref 101–111)
CO2: 27 mmol/L (ref 22–32)
Calcium: 8.8 mg/dL — ABNORMAL LOW (ref 8.9–10.3)
Creatinine, Ser: 0.89 mg/dL (ref 0.44–1.00)
GFR calc non Af Amer: >60 mL/min (ref >60)
Glucose, Bld: 94 mg/dL (ref 65–99)
Potassium: 3 mmol/L — ABNORMAL LOW (ref 3.5–5.1)
Sodium: 142 mmol/L (ref 135–145)

## 2016-11-02 LAB — GLUCOSE, CAPILLARY
GLUCOSE-CAPILLARY: 82 mg/dL (ref 65–99)
Glucose-Capillary: 91 mg/dL (ref 65–99)

## 2016-11-02 SURGERY — COLONOSCOPY WITH PROPOFOL
Anesthesia: General

## 2016-11-02 MED ORDER — LIDOCAINE HCL (PF) 2 % IJ SOLN
INTRAMUSCULAR | Status: AC
  Filled 2016-11-02: qty 2

## 2016-11-02 MED ORDER — POTASSIUM CHLORIDE CRYS ER 20 MEQ PO TBCR
40.0000 meq | EXTENDED_RELEASE_TABLET | ORAL | Status: AC
  Administered 2016-11-02 (×2): 40 meq via ORAL
  Filled 2016-11-02 (×2): qty 2

## 2016-11-02 MED ORDER — PROPOFOL 10 MG/ML IV BOLUS
INTRAVENOUS | Status: DC | PRN
Start: 2016-11-02 — End: 2016-11-02
  Administered 2016-11-02: 40 mg via INTRAVENOUS

## 2016-11-02 MED ORDER — LIDOCAINE HCL (CARDIAC) 20 MG/ML IV SOLN
INTRAVENOUS | Status: DC | PRN
  Administered 2016-11-02: 80 mg via INTRAVENOUS

## 2016-11-02 MED ORDER — PROPOFOL 500 MG/50ML IV EMUL
INTRAVENOUS | Status: AC
  Filled 2016-11-02: qty 50

## 2016-11-02 MED ORDER — FERROUS SULFATE 325 (65 FE) MG PO TABS: 325.0000 mg | ORAL_TABLET | Freq: Two times a day (BID) | ORAL | 0 refills | Status: DC

## 2016-11-02 MED ORDER — PROPOFOL 500 MG/50ML IV EMUL
INTRAVENOUS | Status: DC | PRN
  Administered 2016-11-02: 100 ug/kg/min via INTRAVENOUS

## 2016-11-02 NOTE — Anesthesia Preprocedure Evaluation (Signed)
Anesthesia Evaluation  Patient identified by MRN, date of birth, ID band Patient awake    Reviewed: Allergy & Precautions, NPO status , Patient's Chart, lab work & pertinent test results  Airway Mallampati: II       Dental  (+) Teeth Intact   Pulmonary neg pulmonary ROS,    Pulmonary exam normal        Cardiovascular Exercise Tolerance: Good hypertension, Pt. on medications and Pt. on home beta blockers  Rhythm:Regular Rate:Normal     Neuro/Psych negative neurological ROS  negative psych ROS   GI/Hepatic negative GI ROS, Neg liver ROS,   Endo/Other  diabetes, Type 2, Oral Hypoglycemic Agents  Renal/GU negative Renal ROS     Musculoskeletal   Abdominal (+) + obese,   Peds negative pediatric ROS (+)  Hematology   Anesthesia Other Findings   Reproductive/Obstetrics                             Anesthesia Physical Anesthesia Plan  ASA: II  Anesthesia Plan: General   Post-op Pain Management:    Induction: Intravenous  PONV Risk Score and Plan: 1 and Ondansetron and Treatment may vary due to age  Airway Management Planned:   Additional Equipment:   Intra-op Plan:   Post-operative Plan:   Informed Consent: I have reviewed the patients History and Physical, chart, labs and discussed the procedure including the risks, benefits and alternatives for the proposed anesthesia with the patient or authorized representative who has indicated his/her understanding and acceptance.     Plan Discussed with: CRNA  Anesthesia Plan Comments:         Anesthesia Quick Evaluation

## 2016-11-02 NOTE — H&P (Signed)
Jonathon Bellows MD [address], [place], [place] [address] Phone: [phone] Fax : [phone]  Primary Care Physician:  Marguerita Merles, MD Primary Gastroenterologist:  Dr. Jonathon Bellows   Pre-Procedure History & Physical: HPI:  Kaitlin Gomez is a 82 y.o. female is here for an colonoscopy.   Past Medical History:  Diagnosis Date  . Crohn's disease ([place])   . Diabetes mellitus without complication ([place])   . Hyperlipemia   . Hypertension     Past Surgical History:  Procedure Laterality Date  . COLONOSCOPY WITH PROPOFOL N/A 01/05/2015   Procedure: COLONOSCOPY WITH PROPOFOL;  Surgeon: Lollie Sails, MD;  Location: [facility] ENDOSCOPY;  Service: Endoscopy;  Laterality: N/A;    Prior to Admission medications   Medication Sig Start Date End Date Taking? Authorizing Provider  amLODipine (NORVASC) 10 MG tablet Take 10 mg by mouth daily.   Yes [provider]  aspirin 81 MG chewable tablet Chew 81 mg by mouth daily.   Yes [provider]  atenolol (TENORMIN) 50 MG tablet Take 50 mg by mouth daily.   Yes [provider]  calcium-vitamin D (OSCAL 500/200 D-3) 500-200 MG-UNIT per tablet Take 1 tablet by mouth 2 (two) times daily.   Yes [provider]  cloNIDine (CATAPRES) 0.3 MG tablet Take 0.3 mg by mouth at bedtime.    Yes [provider]  furosemide (LASIX) 40 MG tablet Take 40 mg by mouth daily.   Yes [provider]  lovastatin (MEVACOR) 40 MG tablet Take 40 mg by mouth at bedtime.   Yes [provider]  metFORMIN (GLUCOPHAGE-XR) 500 MG 24 hr tablet Take 500 mg by mouth daily. 10/09/16  Yes [provider]  quinapril (ACCUPRIL) 40 MG tablet Take 40 mg by mouth 2 (two) times daily.   Yes [provider]  sulfaSALAzine (AZULFIDINE) 500 MG tablet Take 500 mg by mouth 4 (four) times daily. 10/09/16  Yes [provider]  HYDROcodone-acetaminophen (NORCO/VICODIN) 5-325 MG per tablet Take 1 tablet by  mouth every 4 (four) hours as needed for moderate pain. Patient not taking: Reported on 10/30/2016 01/23/15   Harvest Dark, MD  mesalamine (LIALDA) 1.2 G EC tablet Take 4 tablets (4.8 g total) by mouth daily with breakfast. Patient not taking: Reported on 10/30/2016 01/07/15   Max Sane, MD    Allergies as of 10/30/2016 - Review Complete 10/30/2016  Allergen Reaction Noted  . Advil [ibuprofen] Rash 12/31/2014    Family History  Problem Relation Age of Onset  . Hypertension Father     Social History   Social History  . Marital status: Widowed    Spouse name: N/A  . Number of children: N/A  . Years of education: N/A   Occupational History  . Not on file.   Social History Main Topics  . Smoking status: Never Smoker  . Smokeless tobacco: Never Used  . Alcohol use No  . Drug use: No  . Sexual activity: Not on file   Other Topics Concern  . Not on file   Social History Narrative  . No narrative on file    Review of Systems: See HPI, otherwise negative ROS  Physical Exam: BP (!) 159/56 (BP Location: Left Arm)   Pulse 66   Temp 98.1 F (36.7 C) (Oral)   Resp 18   Ht 5\' 7"  (1.702 m)   Wt 185 lb (83.9 kg)   SpO2 100%   BMI 28.98 kg/m  General:   Alert,  pleasant and cooperative in NAD Head:  Normocephalic and atraumatic. Neck:  Supple; no masses or thyromegaly. Lungs:  Clear throughout to auscultation.    Heart:  Regular rate and rhythm. Abdomen:  Soft, nontender and nondistended. Normal bowel sounds, without guarding, and without rebound.   Neurologic:  Alert and  oriented x4;  grossly normal neurologically.  Impression/Plan: Kaitlin Gomez is here for an colonoscopy to be performed for rectal bleeding  Risks, benefits, limitations, and alternatives regarding  colonoscopy have been reviewed with the patient.  Questions have been answered.  All parties agreeable.   Jonathon Bellows, MD  11/02/2016, 10:24 AM

## 2016-11-02 NOTE — Progress Notes (Signed)
See endoscopy note for recommendations.   I will sign off.  Please call me if any further GI concerns or questions.  We would like to thank you for the opportunity to participate in the care of Kaitlin Gomez.

## 2016-11-02 NOTE — Op Note (Signed)
[facility] Gastroenterology Patient Name: Kaitlin Gomez Procedure Date: 11/02/2016 10:22 AM MRN: [id number] Account #: [id number] Date of Birth: 23-Aug-1934 Admit Type: Inpatient Age: 82 Room: [facility] ENDO ROOM 4 Gender: Female Note Status: Finalized Procedure:            Colonoscopy Indications:          Rectal bleeding Providers:            Jonathon Bellows MD, MD Referring MD:         Marguerita Merles, MD (Referring MD) Medicines:            Monitored Anesthesia Care Complications:        No immediate complications. Procedure:            Pre-Anesthesia Assessment:                       - Prior to the procedure, a History and Physical was                        performed, and patient medications, allergies and                        sensitivities were reviewed. The patient's tolerance of                        previous anesthesia was reviewed.                       - The risks and benefits of the procedure and the                        sedation options and risks were discussed with the                        patient. All questions were answered and informed                        consent was obtained.                       - ASA Grade Assessment: II - A patient with mild                        systemic disease.                       After obtaining informed consent, the colonoscope was                        passed under direct vision. Throughout the procedure,                        the patient's blood pressure, pulse, and oxygen                        saturations were monitored continuously. The                        Colonoscope was introduced through the anus and                        advanced  to the the cecum, identified by the                        appendiceal orifice, IC valve and transillumination.                        The colonoscopy was performed with ease. The patient                        tolerated the procedure well. The quality of the bowel             preparation was adequate. Findings:      The perianal exam findings include anal canal stenosis.      Noted very tight anal stricture through which my index finger could only       be passed a short distance, changed to an upper endoscope for the       procedure      There was evidence of a prior end-to-end colo-colonic anastomosis in the       sigmoid colon. This was patent and was characterized by healthy       appearing mucosa. The anastomosis was traversed. Likely left       hemicolectomy      Multiple medium-mouthed diverticula were found in the transverse colon       and right colon.      The exam was otherwise without abnormality on direct and retroflexion       views.      The terminal ileum appeared normal. Impression:           - Anal canal stenosis found on perianal exam.                       - Patent end-to-end colo-colonic anastomosis,                        characterized by healthy appearing mucosa.                       - Diverticulosis in the transverse colon and in the                        right colon.                       - The examination was otherwise normal on direct and                        retroflexion views.                       - No specimens collected. Recommendation:       - Return patient to hospital ward for ongoing care.                       - 1. Advance diet and discharge                       2. Likely diverticular bleed that has resolved                       3. Normal mucosa of the colon with no evidence of  inflammation                       4. Follow up with Dr Gustavo Lah as an outpatient                       5. Tight anal stricture- noted on prior colonoscopy as                        well. She can discuss with Dr Gustavo Lah as an outpatient                        and if has had difficulty with defecation then can have                        dilation performed with colorectal surgery                       6.  Check CBC with pcp in a week                       I Gomez sign out please call with questions Procedure Code(s):    --- Professional ---                       [phone], Colonoscopy, flexible; diagnostic, including                        collection of specimen(s) by brushing or washing, when                        performed (separate procedure) Diagnosis Code(s):    --- Professional ---                       K62.4, Stenosis of anus and rectum                       Z98.0, Intestinal bypass and anastomosis status                       K62.5, Hemorrhage of anus and rectum                       K57.30, Diverticulosis of large intestine without                        perforation or abscess without bleeding CPT copyright 2016 American Medical Association. All rights reserved. The codes documented in this report are preliminary and upon coder review may  be revised to meet current compliance requirements. Jonathon Bellows, MD Jonathon Bellows MD, MD 11/02/2016 10:55:18 AM This report has been signed electronically. Number of Addenda: 0 Note Initiated On: 11/02/2016 10:22 AM Scope Withdrawal Time: 0 hours 6 minutes 4 seconds  Total Procedure Duration: 0 hours 6 minutes 55 seconds       [facility]

## 2016-11-02 NOTE — Care Management Important Message (Signed)
Important Message  Patient Details  Name: Kaitlin Gomez MRN: [id number] Date of Birth: December 11, 1934   Medicare Important Message Given:  Yes    Beverly Sessions, RN 11/02/2016, 2:15 PM

## 2016-11-02 NOTE — Transfer of Care (Signed)
Immediate Anesthesia Transfer of Care Note  Patient: Kaitlin Gomez  Procedure(s) Performed: Procedure(s): COLONOSCOPY WITH PROPOFOL (N/A)  Patient Location: Endoscopy Unit  Anesthesia Type:General  Level of Consciousness: awake and patient cooperative  Airway & Oxygen Therapy: Patient Spontanous Breathing and Patient connected to nasal cannula oxygen  Post-op Assessment: Report given to RN, Post -op Vital signs reviewed and stable and Patient moving all extremities X 4  Post vital signs: Reviewed and stable  Last Vitals:  Vitals:   11/02/16 0541 11/02/16 1053  BP: (!) 159/56   Pulse: 66   Resp: 18   Temp: 36.7 C 36.3 C    Last Pain:  Vitals:   11/02/16 1053  TempSrc: Tympanic  PainSc:          Complications: No apparent anesthesia complications

## 2016-11-02 NOTE — Progress Notes (Signed)
Pt A and O x 4. VSS. Pt tolerating diet well. No complaints of pain or nausea. IV removed intact, prescriptions given. Pt voiced understanding of discharge instructions with no further questions. Pt discharged via wheelchair with axillary.   

## 2016-11-02 NOTE — OR Nursing (Signed)
Report to primary rn on 2c  After colonoscopy. Pt to return to floor and probable discharge today home

## 2016-11-02 NOTE — Anesthesia Postprocedure Evaluation (Signed)
Anesthesia Post Note  Patient: Kaitlin Gomez  Procedure(s) Performed: Procedure(s) (LRB): COLONOSCOPY WITH PROPOFOL (N/A)  Patient location during evaluation: PACU Anesthesia Type: General Level of consciousness: awake Pain management: pain level controlled Respiratory status: nonlabored ventilation Cardiovascular status: stable Anesthetic complications: no     Last Vitals:  Vitals:   11/02/16 1103 11/02/16 1214  BP: (!) 165/65 (!) 149/67  Pulse:  87  Resp:  18  Temp:  37.1 C    Last Pain:  Vitals:   11/02/16 1214  TempSrc: Oral  PainSc:                  VAN STAVEREN,[name]

## 2016-11-02 NOTE — Anesthesia Post-op Follow-up Note (Cosign Needed)
Anesthesia QCDR form completed.        

## 2016-11-03 NOTE — Discharge Summary (Signed)
Kaitlin Gomez at [facility] NAME: Kaitlin Gomez    MR#:  [id number]  DATE OF BIRTH:  Jan 21, 1935  DATE OF ADMISSION:  10/30/2016 ADMITTING PHYSICIAN: Lance Coon, MD  DATE OF DISCHARGE: 11/02/2016  4:16 PM  PRIMARY CARE PHYSICIAN: Marguerita Merles, MD   ADMISSION DIAGNOSIS:  Lower GI bleed [K92.2]  DISCHARGE DIAGNOSIS:  Principal Problem:   Lower GI bleed Active Problems:   Diabetes ([place])   HTN (hypertension)   Crohn's disease ([place])   SECONDARY DIAGNOSIS:   Past Medical History:  Diagnosis Date  . Crohn's disease ([place])   . Diabetes mellitus without complication ([place])   . Hyperlipemia   . Hypertension      ADMITTING HISTORY  HISTORY OF PRESENT ILLNESS:  Kaitlin Gomez  is a 82 y.o. female who presents with bleeding per rectum.  Patient has history of crohn's disease and diverticulosis/diverticulitis with lower gi bleed in past.  Hemoglobin is stable here.  Patient states she has had 4 bowel movements, grossly bloody, today, with red blood and clots.  Hospitalists were called for admission.   HOSPITAL COURSE:   * Acute diverticular bleed * Acute blood loss anemia * Rectal stricture * Crohn's disease * Hypertension  Patient was admitted onto medical floor due to rectal bleeding. She had significant blood loss along with drop in hemoglobin. She had a nuclear bleeding scan done which was negative. After this patient was prepped for colonoscopy and colonoscopy showed no bleeding but significant diverticulosis. She was also found to have rectal stricture. But no complains of constipation. Will need outpatient GI follow-up.  At discharge patient is being discharged home on iron supplements. Aspirin held.  Stable for discharge home. No further bleeding.  CONSULTS OBTAINED:  Treatment Team:  Jonathon Bellows, MD  DRUG ALLERGIES:   Allergies  Allergen Reactions  . Advil [Ibuprofen] Rash    DISCHARGE MEDICATIONS:   Discharge Medication List  as of 11/02/2016  2:09 PM    START taking these medications   Details  ferrous sulfate 325 (65 FE) MG tablet Take 1 tablet (325 mg total) by mouth 2 (two) times daily with a meal., Starting Fri 11/02/2016, Normal      CONTINUE these medications which have NOT CHANGED   Details  amLODipine (NORVASC) 10 MG tablet Take 10 mg by mouth daily., Until Discontinued, Historical Med    atenolol (TENORMIN) 50 MG tablet Take 50 mg by mouth daily., Until Discontinued, Historical Med    calcium-vitamin D (OSCAL 500/200 D-3) 500-200 MG-UNIT per tablet Take 1 tablet by mouth 2 (two) times daily., Until Discontinued, Historical Med    cloNIDine (CATAPRES) 0.3 MG tablet Take 0.3 mg by mouth at bedtime. , Historical Med    furosemide (LASIX) 40 MG tablet Take 40 mg by mouth daily., Until Discontinued, Historical Med    lovastatin (MEVACOR) 40 MG tablet Take 40 mg by mouth at bedtime., Until Discontinued, Historical Med    metFORMIN (GLUCOPHAGE-XR) 500 MG 24 hr tablet Take 500 mg by mouth daily., Starting Tue 10/09/2016, Historical Med    quinapril (ACCUPRIL) 40 MG tablet Take 40 mg by mouth 2 (two) times daily., Until Discontinued, Historical Med    sulfaSALAzine (AZULFIDINE) 500 MG tablet Take 500 mg by mouth 4 (four) times daily., Starting Tue 10/09/2016, Historical Med      STOP taking these medications     aspirin 81 MG chewable tablet      HYDROcodone-acetaminophen (NORCO/VICODIN) 5-325 MG per tablet  mesalamine (LIALDA) 1.2 G EC tablet         Today   VITAL SIGNS:  Blood pressure (!) 149/67, pulse 87, temperature 98.7 F (37.1 C), temperature source Oral, resp. rate 18, height 5\' 7"  (1.702 m), weight 83.9 kg (185 lb), SpO2 100 %.  I/O:  No intake or output data in the 24 hours ending 11/03/16 1431  PHYSICAL EXAMINATION:  Physical Exam  GENERAL:  82 y.o.-year-old patient lying in the bed with no acute distress.  LUNGS: Normal breath sounds bilaterally, no wheezing, rales,rhonchi  or crepitation. No use of accessory muscles of respiration.  CARDIOVASCULAR: S1, S2 normal. No murmurs, rubs, or gallops.  ABDOMEN: Soft, non-tender, non-distended. Bowel sounds present. No organomegaly or mass.  NEUROLOGIC: Moves all 4 extremities. PSYCHIATRIC: The patient is alert and oriented x 3.  SKIN: No obvious rash, lesion, or ulcer.   DATA REVIEW:   CBC  Recent Labs Lab 10/31/16 0137  11/02/16 0508  WBC 8.5  --   --   HGB 12.9  < > 8.7*  HCT 38.1  --   --   PLT 200  --   --   < > = values in this interval not displayed.  Chemistries   Recent Labs Lab 11/02/16 0508  NA 142  K 3.0*  CL 107  CO2 27  GLUCOSE 94  BUN 16  CREATININE 0.89  CALCIUM 8.8*    Cardiac Enzymes No results for input(s): TROPONINI in the last 168 hours.  Microbiology Results  Results for orders placed or performed during the hospital encounter of 01/23/15  Culture, blood (routine x 2)     Status: None   Collection Time: 01/23/15  6:30 AM  Result Value Ref Range Status   Specimen Description BLOOD RIGHT ASSIST CONTROL  Final   Special Requests BOTTLES DRAWN AEROBIC AND ANAEROBIC  [place]  Final   Culture NO GROWTH 5 DAYS  Final   Report Status 01/28/2015 FINAL  Final  Culture, blood (routine x 2)     Status: None   Collection Time: 01/23/15  7:30 AM  Result Value Ref Range Status   Specimen Description BLOOD RIGHT ASSIST CONTROL  Final   Special Requests BOTTLES DRAWN AEROBIC AND ANAEROBIC  4CC  Final   Culture NO GROWTH 5 DAYS  Final   Report Status 01/28/2015 FINAL  Final  Urine culture     Status: None   Collection Time: 01/23/15  8:02 AM  Result Value Ref Range Status   Specimen Description URINE, RANDOM  Final   Special Requests Normal  Final   Culture MULTIPLE ORGANISMS PRESENT, NONE PREDOMINANT  Final   Report Status 01/24/2015 FINAL  Final    RADIOLOGY:  No results found.  Follow up with PCP in 1 week.  Management plans discussed with the patient, family and they are  in agreement.  CODE STATUS:  Code Status History    Date Active Date Inactive Code Status Order ID Comments User Context   10/30/2016 11:00 PM 11/02/2016  7:21 PM Full Code [id number]  Lance Coon, MD Inpatient   12/31/2014  9:52 AM 01/07/2015  5:53 PM Full Code [id number]  Bettey Costa, MD Inpatient      TOTAL TIME TAKING CARE OF THIS PATIENT ON DAY OF DISCHARGE: more than 30 minutes.   Hillary Bow R M.D on 11/03/2016 at 2:31 PM  Between 7am to 6pm - Pager - [phone]  After 6pm go to [email or web address] - [facility]  Office  [phone]  CC: Primary care physician; Marguerita Merles, MD  Note: This dictation was prepared with Dragon dictation along with smaller phrase technology. Any transcriptional errors that result from this process are unintentional.

## 2016-11-05 ENCOUNTER — Encounter: Payer: Self-pay | Admitting: Gastroenterology

## 2017-06-28 IMAGING — NM NM GI BLOOD LOSS
2 series · 12 of 12 positions shown · non-contrast
Comparison: Abdominopelvic CT scan January 23, 2015 and
gastrointestinal bleeding study December 31, 2014.

CLINICAL DATA: History multiple gastrointestinal bleeding episodes
since the 8394 with 1 requiring surgical intervention. The patient
reports a bloody stools 30 minutes before beginning this procedure.

EXAM:
NUCLEAR MEDICINE GASTROINTESTINAL BLEEDING SCAN
TECHNIQUE: Sequential abdominal images were obtained following intravenous
administration of 1c-DDm labeled red blood cells.
RADIOPHARMACEUTICALS:  20.532 mCi 1c-DDm in-vitro labeled red cells.

[Series 1000: gi bleed hour 2 · 4.80mm/px · 6 of 60 frames shown]
[frame 6/60]
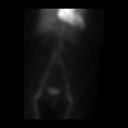
[frame 16/60]
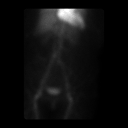
[frame 26/60]
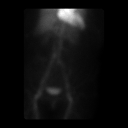
[frame 36/60]
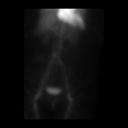
[frame 46/60]
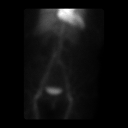
[frame 56/60]
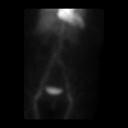

[Series 1000: hour 1 gi bleed · 4.80mm/px · 6 of 60 frames shown]
[frame 6/60]
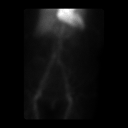
[frame 16/60]
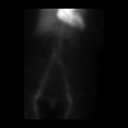
[frame 26/60]
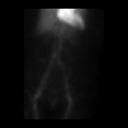
[frame 36/60]
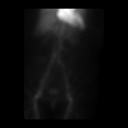
[frame 46/60]
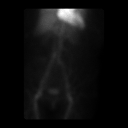
[frame 56/60]
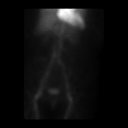

[12 of 12 positions shown; findings below may reference images not displayed]

FINDINGS: Imaging was performed over 2 hours. There is normal appearing
activity within the liver, heart, kidneys and vascular structures.
No abnormal accumulation of radiotracer is seen to suggest active
gastrointestinal bleeding.
IMPRESSION: No evidence of active gastrointestinal bleeding at this time.

## 2022-02-25 ENCOUNTER — Inpatient Hospital Stay (HOSPITAL_COMMUNITY): Payer: Medicare HMO

## 2022-02-25 ENCOUNTER — Other Ambulatory Visit: Payer: Self-pay

## 2022-02-25 ENCOUNTER — Emergency Department: Payer: Medicare HMO

## 2022-02-25 ENCOUNTER — Encounter (HOSPITAL_COMMUNITY): Payer: Self-pay

## 2022-02-25 ENCOUNTER — Emergency Department
Admission: EM | Admit: 2022-02-25 | Discharge: 2022-02-25 | Disposition: A | Discharge status: Other Acute Inpatient Hospital | Payer: Medicare HMO | Attending: Emergency Medicine | Admitting: Emergency Medicine

## 2022-02-25 ENCOUNTER — Inpatient Hospital Stay (HOSPITAL_COMMUNITY)
Admission: EM | Admit: 2022-02-25 | Discharge: 2022-03-07 | DRG: 356 | Disposition: A | Discharge status: Home / Self Care | Payer: Medicare HMO | Source: Other Acute Inpatient Hospital | Attending: Internal Medicine | Admitting: Internal Medicine

## 2022-02-25 DIAGNOSIS — R29700 NIHSS score 0: Secondary | ICD-10-CM | POA: Diagnosis not present

## 2022-02-25 DIAGNOSIS — K922 Gastrointestinal hemorrhage, unspecified: Secondary | ICD-10-CM | POA: Insufficient documentation

## 2022-02-25 DIAGNOSIS — E669 Obesity, unspecified: Secondary | ICD-10-CM | POA: Diagnosis present

## 2022-02-25 DIAGNOSIS — E119 Type 2 diabetes mellitus without complications: Secondary | ICD-10-CM

## 2022-02-25 DIAGNOSIS — K5733 Diverticulitis of large intestine without perforation or abscess with bleeding: Principal | ICD-10-CM | POA: Diagnosis present

## 2022-02-25 DIAGNOSIS — R000 Tachycardia, unspecified: Secondary | ICD-10-CM | POA: Diagnosis not present

## 2022-02-25 DIAGNOSIS — R001 Bradycardia, unspecified: Secondary | ICD-10-CM | POA: Diagnosis present

## 2022-02-25 DIAGNOSIS — I6522 Occlusion and stenosis of left carotid artery: Secondary | ICD-10-CM | POA: Diagnosis present

## 2022-02-25 DIAGNOSIS — E785 Hyperlipidemia, unspecified: Secondary | ICD-10-CM | POA: Diagnosis present

## 2022-02-25 DIAGNOSIS — I10 Essential (primary) hypertension: Secondary | ICD-10-CM | POA: Insufficient documentation

## 2022-02-25 DIAGNOSIS — G9349 Other encephalopathy: Secondary | ICD-10-CM | POA: Diagnosis present

## 2022-02-25 DIAGNOSIS — Z6829 Body mass index (BMI) 29.0-29.9, adult: Secondary | ICD-10-CM | POA: Diagnosis not present

## 2022-02-25 DIAGNOSIS — I6340 Cerebral infarction due to embolism of unspecified cerebral artery: Secondary | ICD-10-CM | POA: Diagnosis not present

## 2022-02-25 DIAGNOSIS — R4182 Altered mental status, unspecified: Secondary | ICD-10-CM | POA: Diagnosis not present

## 2022-02-25 DIAGNOSIS — Z9049 Acquired absence of other specified parts of digestive tract: Secondary | ICD-10-CM | POA: Diagnosis not present

## 2022-02-25 DIAGNOSIS — K5090 Crohn's disease, unspecified, without complications: Secondary | ICD-10-CM | POA: Diagnosis present

## 2022-02-25 DIAGNOSIS — N179 Acute kidney failure, unspecified: Secondary | ICD-10-CM | POA: Diagnosis present

## 2022-02-25 DIAGNOSIS — I639 Cerebral infarction, unspecified: Secondary | ICD-10-CM | POA: Diagnosis not present

## 2022-02-25 DIAGNOSIS — F05 Delirium due to known physiological condition: Secondary | ICD-10-CM | POA: Diagnosis not present

## 2022-02-25 DIAGNOSIS — R600 Localized edema: Secondary | ICD-10-CM | POA: Diagnosis present

## 2022-02-25 DIAGNOSIS — Z886 Allergy status to analgesic agent status: Secondary | ICD-10-CM

## 2022-02-25 DIAGNOSIS — I1A0 Resistant hypertension: Secondary | ICD-10-CM | POA: Diagnosis present

## 2022-02-25 DIAGNOSIS — K921 Melena: Secondary | ICD-10-CM | POA: Diagnosis present

## 2022-02-25 DIAGNOSIS — E876 Hypokalemia: Secondary | ICD-10-CM

## 2022-02-25 DIAGNOSIS — I440 Atrioventricular block, first degree: Secondary | ICD-10-CM | POA: Diagnosis present

## 2022-02-25 DIAGNOSIS — R339 Retention of urine, unspecified: Secondary | ICD-10-CM | POA: Diagnosis not present

## 2022-02-25 DIAGNOSIS — Z79899 Other long term (current) drug therapy: Secondary | ICD-10-CM

## 2022-02-25 DIAGNOSIS — D62 Acute posthemorrhagic anemia: Secondary | ICD-10-CM | POA: Diagnosis present

## 2022-02-25 DIAGNOSIS — Z8249 Family history of ischemic heart disease and other diseases of the circulatory system: Secondary | ICD-10-CM

## 2022-02-25 DIAGNOSIS — R109 Unspecified abdominal pain: Secondary | ICD-10-CM | POA: Diagnosis present

## 2022-02-25 DIAGNOSIS — K5793 Diverticulitis of intestine, part unspecified, without perforation or abscess with bleeding: Secondary | ICD-10-CM | POA: Diagnosis not present

## 2022-02-25 DIAGNOSIS — R410 Disorientation, unspecified: Secondary | ICD-10-CM | POA: Diagnosis not present

## 2022-02-25 DIAGNOSIS — Z7984 Long term (current) use of oral hypoglycemic drugs: Secondary | ICD-10-CM

## 2022-02-25 DIAGNOSIS — I6523 Occlusion and stenosis of bilateral carotid arteries: Secondary | ICD-10-CM | POA: Diagnosis not present

## 2022-02-25 HISTORY — PX: IR US GUIDE VASC ACCESS RIGHT: IMG2390

## 2022-02-25 HISTORY — PX: IR EMBO ART  VEN HEMORR LYMPH EXTRAV  INC GUIDE ROADMAPPING: IMG5450

## 2022-02-25 HISTORY — PX: IR ANGIOGRAM SELECTIVE EACH ADDITIONAL VESSEL: IMG667

## 2022-02-25 HISTORY — PX: IR ANGIOGRAM VISCERAL SELECTIVE: IMG657

## 2022-02-25 HISTORY — DX: Hypokalemia: E87.6

## 2022-02-25 LAB — COMPREHENSIVE METABOLIC PANEL
ALT: 20 U/L (ref 0–44)
AST: 22 U/L (ref 15–41)
Albumin: 3.9 g/dL (ref 3.5–5.0)
Alkaline Phosphatase: 67 U/L (ref 38–126)
Anion gap: 8 (ref 5–15)
BUN: 16 mg/dL (ref 8–23)
CO2: 23 mmol/L (ref 22–32)
Calcium: 9.2 mg/dL (ref 8.9–10.3)
Chloride: 109 mmol/L (ref 98–111)
Creatinine, Ser: 1.09 mg/dL — ABNORMAL HIGH (ref 0.44–1.00)
GFR, Estimated: 50 mL/min — ABNORMAL LOW (ref >60)
Glucose, Bld: 133 mg/dL — ABNORMAL HIGH (ref 70–99)
Potassium: 3.3 mmol/L — ABNORMAL LOW (ref 3.5–5.1)
Sodium: 140 mmol/L (ref 135–145)
Total Bilirubin: 0.6 mg/dL (ref 0.3–1.2)
Total Protein: 7.2 g/dL (ref 6.5–8.1)

## 2022-02-25 LAB — HEMOGLOBIN A1C
Hgb A1c MFr Bld: 6.3 % — ABNORMAL HIGH (ref 4.8–5.6)
Mean Plasma Glucose: 134.11 mg/dL

## 2022-02-25 LAB — CBC
HCT: 43.9 % (ref 36.0–46.0)
HCT: 37.6 % (ref 36.0–46.0)
HCT: 34.9 % — ABNORMAL LOW (ref 36.0–46.0)
Hemoglobin: 14.3 g/dL (ref 12.0–15.0)
Hemoglobin: 12.1 g/dL (ref 12.0–15.0)
Hemoglobin: 11.3 g/dL — ABNORMAL LOW (ref 12.0–15.0)
MCH: 29.6 pg (ref 26.0–34.0)
MCH: 29.9 pg (ref 26.0–34.0)
MCH: 30.3 pg (ref 26.0–34.0)
MCHC: 32.6 g/dL (ref 30.0–36.0)
MCHC: 32.2 g/dL (ref 30.0–36.0)
MCHC: 32.4 g/dL (ref 30.0–36.0)
MCV: 90.9 fL (ref 80.0–100.0)
MCV: 92.8 fL (ref 80.0–100.0)
MCV: 93.6 fL (ref 80.0–100.0)
Platelets: 242 10*3/uL (ref 150–400)
Platelets: 222 10*3/uL (ref 150–400)
Platelets: 189 10*3/uL (ref 150–400)
RBC: 4.83 MIL/uL (ref 3.87–5.11)
RBC: 4.05 MIL/uL (ref 3.87–5.11)
RBC: 3.73 MIL/uL — ABNORMAL LOW (ref 3.87–5.11)
RDW: 13.4 % (ref 11.5–15.5)
RDW: 13.4 % (ref 11.5–15.5)
RDW: 13.6 % (ref 11.5–15.5)
WBC: 5.7 10*3/uL (ref 4.0–10.5)
WBC: 9.7 10*3/uL (ref 4.0–10.5)
WBC: 10.3 10*3/uL (ref 4.0–10.5)
nRBC: 0 % (ref 0.0–0.2)
nRBC: 0 % (ref 0.0–0.2)
nRBC: 0 % (ref 0.0–0.2)

## 2022-02-25 LAB — PROTIME-INR
INR: 1.2 (ref 0.8–1.2)
Prothrombin Time: 15 seconds (ref 11.4–15.2)

## 2022-02-25 LAB — TYPE AND SCREEN
ABO/RH(D): A POS
ABO/RH(D): A POS
Antibody Screen: NEGATIVE
Antibody Screen: NEGATIVE

## 2022-02-25 LAB — TSH: TSH: 0.811 u[IU]/mL (ref 0.350–4.500)

## 2022-02-25 LAB — MAGNESIUM: Magnesium: 1.5 mg/dL — ABNORMAL LOW (ref 1.7–2.4)

## 2022-02-25 LAB — TROPONIN I (HIGH SENSITIVITY): Troponin I (High Sensitivity): 15 ng/L (ref <18)

## 2022-02-25 LAB — CBG MONITORING, ED: Glucose-Capillary: 146 mg/dL — ABNORMAL HIGH (ref 70–99)

## 2022-02-25 MED ORDER — ACETAMINOPHEN 325 MG PO TABS
650.0000 mg | ORAL_TABLET | Freq: Four times a day (QID) | ORAL | Status: DC | PRN
  Administered 2022-03-02 – 2022-03-06 (×2): 650 mg via ORAL
  Filled 2022-02-25 (×2): qty 2

## 2022-02-25 MED ORDER — FENTANYL CITRATE (PF) 100 MCG/2ML IJ SOLN
INTRAMUSCULAR | Status: AC | PRN
  Administered 2022-02-25: 25 ug via INTRAVENOUS

## 2022-02-25 MED ORDER — MORPHINE SULFATE (PF) 2 MG/ML IV SOLN
1.0000 mg | INTRAVENOUS | Status: DC | PRN
  Administered 2022-02-25 – 2022-02-26 (×2): 1 mg via INTRAVENOUS
  Filled 2022-02-25 (×2): qty 1

## 2022-02-25 MED ORDER — INSULIN ASPART 100 UNIT/ML IJ SOLN
0.0000 [IU] | Freq: Three times a day (TID) | INTRAMUSCULAR | Status: DC
  Administered 2022-02-26: 2 [IU] via SUBCUTANEOUS
  Administered 2022-02-26: 1 [IU] via SUBCUTANEOUS
  Administered 2022-02-26 – 2022-02-27 (×2): 2 [IU] via SUBCUTANEOUS
  Administered 2022-02-27 – 2022-03-02 (×6): 1 [IU] via SUBCUTANEOUS
  Administered 2022-03-03: 3 [IU] via SUBCUTANEOUS
  Administered 2022-03-03 – 2022-03-04 (×3): 1 [IU] via SUBCUTANEOUS
  Administered 2022-03-05 (×2): 2 [IU] via SUBCUTANEOUS
  Administered 2022-03-06: 1 [IU] via SUBCUTANEOUS
  Administered 2022-03-06: 3 [IU] via SUBCUTANEOUS
  Administered 2022-03-06: 2 [IU] via SUBCUTANEOUS
  Administered 2022-03-07: 1 [IU] via SUBCUTANEOUS
  Administered 2022-03-07: 3 [IU] via SUBCUTANEOUS

## 2022-02-25 MED ORDER — LIDOCAINE HCL 1 % IJ SOLN
INTRAMUSCULAR | Status: AC
  Filled 2022-02-25: qty 20

## 2022-02-25 MED ORDER — FENTANYL CITRATE (PF) 100 MCG/2ML IJ SOLN
INTRAMUSCULAR | Status: AC
  Filled 2022-02-25: qty 2

## 2022-02-25 MED ORDER — POTASSIUM CHLORIDE 10 MEQ/100ML IV SOLN
10.0000 meq | INTRAVENOUS | Status: AC
  Administered 2022-02-25: 10 meq via INTRAVENOUS
  Filled 2022-02-25: qty 100

## 2022-02-25 MED ORDER — IOHEXOL 350 MG/ML SOLN
100.0000 mL | Freq: Once | INTRAVENOUS | Status: AC | PRN
  Administered 2022-02-25: 100 mL via INTRAVENOUS

## 2022-02-25 MED ORDER — MIDAZOLAM HCL 2 MG/2ML IJ SOLN
INTRAMUSCULAR | Status: AC | PRN
  Administered 2022-02-25: .5 mg via INTRAVENOUS

## 2022-02-25 MED ORDER — LIDOCAINE HCL 1 % IJ SOLN
INTRAMUSCULAR | Status: AC | PRN
  Administered 2022-02-25: 10 mL via INTRADERMAL

## 2022-02-25 MED ORDER — MIDAZOLAM HCL 2 MG/2ML IJ SOLN
INTRAMUSCULAR | Status: AC
  Filled 2022-02-25: qty 2

## 2022-02-25 MED ORDER — ACETAMINOPHEN 650 MG RE SUPP: 650.0000 mg | Freq: Four times a day (QID) | RECTAL | Status: DC | PRN

## 2022-02-25 MED ORDER — HYDRALAZINE HCL 20 MG/ML IJ SOLN
10.0000 mg | Freq: Three times a day (TID) | INTRAMUSCULAR | Status: DC | PRN
  Administered 2022-03-01: 10 mg via INTRAVENOUS
  Filled 2022-02-25: qty 1

## 2022-02-25 MED ORDER — MAGNESIUM SULFATE 2 GM/50ML IV SOLN
2.0000 g | Freq: Once | INTRAVENOUS | Status: AC
  Administered 2022-02-25: 2 g via INTRAVENOUS
  Filled 2022-02-25: qty 50

## 2022-02-25 MED ORDER — LACTATED RINGERS IV SOLN: INTRAVENOUS | Status: DC

## 2022-02-25 NOTE — ED Notes (Signed)
Called  [facility] for transfer to [place]

## 2022-02-25 NOTE — ED Notes (Signed)
Patient arrived via [facility] in stable condition, AOX4, no new concerns at this time, will continue to monitor.

## 2022-02-25 NOTE — Assessment & Plan Note (Signed)
Has not seen GI since 2018 during last flair, she has had no further flairs since that time  No evidence of inflammation CT GI consulted

## 2022-02-25 NOTE — Procedures (Addendum)
Interventional Radiology Procedure Note  Procedure:   US guided access right CFA Mesenteric angiogram, targeting known site of right colonic bleeding.  Embolization of the vasa recta supplying the diverticular bleed, right colon.  CELT for hemostasis.   Findings: Angio confirms right colon diverticular hemorrhage.  Treated with coil embolization of the vasa recta right colon. To stasis.  .  Complications: None EBL: None  Recommendations:  - Right hip straight while in bed rest, but can move for comfort as needed - routine wound care right CFA access - Do not submerge for 7 days - Serial H&H - Expect to have ongoing hematochezia for at least 24 to 48 hours. - OK for advancing diet, per primary order - VIR to follow  Routine line care   Signed,  Dulcy Fanny. Earleen Newport, DO

## 2022-02-25 NOTE — ED Notes (Signed)
Patient signed paper copy of transfer consent at this time. Copy made by ED secretary.

## 2022-02-25 NOTE — ED Notes (Signed)
Patient at CT scan.

## 2022-02-25 NOTE — Assessment & Plan Note (Addendum)
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 -will attempt to wean down clonidine as likely contributing to her bradycardia as well  -continue norvasc -hold lasix and accupril with normotensive readings and add back as needed  Prn IV hydralazine for sbp >180

## 2022-02-25 NOTE — Consult Note (Signed)
Chief Complaint: Bright red blood per rectum  Referring Physician(s): Dr. Doren Custard, Emergency  GI on call: Dr. Loletha Carrow PCP: Dr. Delight Stare [facility] [place], [place], [place] [address]   Supervising Physician: Corrie Mckusick  Patient Status: [facility]  History of Present Illness: Kaitlin Gomez is a 87 y.o. female presenting as transfer from [facility] to [facility] for management of lower GI hemorrhage.   Kaitlin Gomez is here by herself in her [facility] room at [facility] when we met.   She says when she woke this am at 5 or 6am she had to immediately move her bowels, and that she had frank blood in the toilet.  She then asked her brother to drive her to the [facility] for care.   She says she has some mild associated lower abdominal pain, across her lower abdomen.  She reports feeling woozy, but not passing out. She did not fall.   She tells me that the last time she had an episode was 2018.  She has known crohns disease which she says she has not been treated since 2018 when her GI doctor retired.  She has previously had what sounds like a small bowel resection at [facility].  Her first episode ever of GI bleeding was in the 1980's, so she is familiar.   She denies blood thinners.   She denies ever having an MI.  Denies stroke.  She has no resting chest pain or chest pain with activity.  Denies history of CHF/edema.  She reports some swelling of the lower extremity, which is worst at the end of the day.   She is divorced from her late husband.  She lives with her son, and she has a brother in [place],  She makes all of her own decisions.   Vitals: 492-010 systolic 07-121 diastolic  HR is 97-58 Temp: 98 RR: 18  Labs: NA: 140 K: 3.3 Mg: 1.5 BUN: 16 Cr: 1.09 GFR: 50 WBC: 5.7 H&H: 14.3/43.9 --> pending Platelets: 242 Type and screen was completed at [facility] at 6:54am, new pending INR: 1.2 A1C: 6.3  Imaging:  CTA completed at [facility] 9:29am Positive for right colon site of hemorrhage,  SMA/ileocolic territory.  No CT evidence of IBD on the current CT.    Past Medical History:  Diagnosis Date   Crohn's disease ([place])    Diabetes mellitus without complication ([place])    Hyperlipemia    Hypertension     Past Surgical History:  Procedure Laterality Date   COLONOSCOPY WITH PROPOFOL N/A 01/05/2015   Procedure: COLONOSCOPY WITH PROPOFOL;  Surgeon: Lollie Sails, MD;  Location: [facility] ENDOSCOPY;  Service: Endoscopy;  Laterality: N/A;   COLONOSCOPY WITH PROPOFOL N/A 11/02/2016   Procedure: COLONOSCOPY WITH PROPOFOL;  Surgeon: Jonathon Bellows, MD;  Location[facility] ENDOSCOPY;  Service: Endoscopy;  Laterality: N/A;    Allergies: Advil [ibuprofen]  Medications: Prior to Admission medications   Medication Sig Start Date End Date Taking? Authorizing Provider  amLODipine (NORVASC) 10 MG tablet Take 10 mg by mouth daily.    [provider]  atenolol (TENORMIN) 50 MG tablet Take 50 mg by mouth daily.    [provider]  calcium-vitamin D (OSCAL 500/200 D-3) 500-200 MG-UNIT per tablet Take 1 tablet by mouth 2 (two) times daily.    [provider]  cloNIDine (CATAPRES) 0.3 MG tablet Take 0.3 mg by mouth at bedtime.     [provider]  ferrous sulfate 325 (65 FE) MG tablet  Take 1 tablet (325 mg total) by mouth 2 (two) times daily with a meal. 11/02/16   Hillary Bow, MD  furosemide (LASIX) 40 MG tablet Take 40 mg by mouth daily.    [provider]  lovastatin (MEVACOR) 40 MG tablet Take 40 mg by mouth at bedtime.    [provider]  metFORMIN (GLUCOPHAGE-XR) 500 MG 24 hr tablet Take 500 mg by mouth daily. 10/09/16   [provider]  quinapril (ACCUPRIL) 40 MG tablet Take 40 mg by mouth 2 (two) times daily.    [provider]  sulfaSALAzine (AZULFIDINE) 500 MG tablet Take 500 mg by mouth 4 (four) times daily. 10/09/16   [provider]     Family History  Problem Relation Age of Onset   Hypertension Father      Social History   Socioeconomic History   Marital status: Widowed    Spouse name: Not on file   Number of children: Not on file   Years of education: Not on file   Highest education level: Not on file  Occupational History   Not on file  Tobacco Use   Smoking status: Never   Smokeless tobacco: Never  Substance and Sexual Activity   Alcohol use: No   Drug use: No   Sexual activity: Not on file  Other Topics Concern   Not on file  Social History Narrative   Not on file   Social Determinants of Health   Financial Resource Strain: Not on file  Food Insecurity: Not on file  Transportation Needs: Not on file  Physical Activity: Not on file  Stress: Not on file  Social Connections: Not on file      Review of Systems: A 12 point ROS discussed and pertinent positives are indicated in the HPI above.  All other systems are negative.  Review of Systems  Vital Signs: BP (!) 140/127   Pulse (!) 55   Temp 98 F (36.7 C) (Oral)   Resp 17   SpO2 100%   Advance Care Plan: The advanced care plan/surrogate decision maker was discussed at the time of visit and documented in the medical record.    Physical Exam General: 87 yo female appearing stated age.  Well-developed, well-nourished.  No distress. HEENT: Atraumatic, normocephalic.  Glasses. Conjugate gaze, extra-ocular motor intact. No scleral icterus or scleral injection. No lesions on external ears, nose, lips, or gums.  Oral mucosa moist, pink.  Neck: Symmetric with no goiter enlargement.  Chest/Lungs:  Symmetric chest with inspiration/expiration.  No labored breathing.  Clear to auscultation with no wheezes, rhonchi, or rales.  Heart:  Loletha Grayer, with no third heart sounds appreciated. No JVD appreciated.  Abdomen:  No rebound/rigid.  TTP lower right and left abdomen.  + bowel sounds.   Genito-urinary: Deferred Neurologic: Alert & Oriented to person, place, and time.   Normal affect and insight.  Appropriate questions.   Moving all 4 extremities with gross sensory intact.  Pulse Exam:  No bruit appreciated.    Palpable right CFA & left CFA.    Imaging: CT Angio Abd/Pel W and/or Wo Contrast  Result Date: 02/25/2022 CLINICAL DATA:  Bright red blood per rectum. History of gastrointestinal hemorrhage. EXAM: CTA ABDOMEN AND PELVIS WITHOUT AND WITH CONTRAST TECHNIQUE: Multidetector CT imaging of the abdomen and pelvis was performed using the standard protocol during bolus administration of intravenous contrast. Multiplanar reconstructed images and MIPs were obtained and reviewed to evaluate the vascular anatomy. RADIATION DOSE REDUCTION: This exam  was performed according to the departmental dose-optimization program which includes automated exposure control, adjustment of the mA and/or kV according to patient size and/or use of iterative reconstruction technique. CONTRAST:  141m OMNIPAQUE IOHEXOL 350 MG/ML SOLN COMPARISON:  CT 01/23/2015 FINDINGS: VASCULAR Aorta: Normal caliber aorta without aneurysm, dissection, vasculitis or significant stenosis. Mild intimal calcification aorta is branches Celiac: Patent without evidence of aneurysm, dissection, vasculitis or significant stenosis. SMA: Patent without evidence of aneurysm, dissection, vasculitis or significant stenosis. Renals: Both renal arteries are patent without evidence of aneurysm, dissection, vasculitis, fibromuscular dysplasia or significant stenosis. Small accessory renal arteries on LEFT and RIGHT. IMA: Patent without evidence of aneurysm, dissection, vasculitis or significant stenosis. Inflow: Patent without evidence of aneurysm, dissection, vasculitis or significant stenosis. Proximal Outflow: Bilateral common femoral and visualized portions of the superficial and profunda femoral arteries are patent without evidence of aneurysm, dissection, vasculitis or significant stenosis. Veins: No obvious venous abnormality within the limitations of this arterial phase study.  Review of the MIP images confirms the above findings. NON-VASCULAR Lower chest: Lung bases are clear. Hepatobiliary: No focal hepatic lesion. No biliary duct dilatation. Common bile duct is normal. Pancreas: Pancreas is normal. No ductal dilatation. No pancreatic inflammation. Spleen: Normal spleen Adrenals/urinary tract: Adrenal glands and kidneys are normal. The ureters and bladder normal. Stomach/Bowel: No evidence of active gastrointestinal bleeding within the stomach, duodenum or small bowel. The terminal ileum is normal. Within the midportion of the ascending colon there is a focus of high-density contrast within the dependent lumen of the ascending colon (image 108/series 9) which is not present on the noncontrast CT series. On the more delayed venous phase imaging, this intraluminal contrast increases and pools dependently and spreads antegrade. No evidence of additional active hemorrhage within the LEFT colon or rectosigmoid colon. Multiple diverticula through the colon. Vascular/Lymphatic: No periportal or retroperitoneal adenopathy. No pelvic adenopathy. Reproductive: Post hysterectomy.  Adnexa unremarkable Other: No free fluid. Musculoskeletal: No aggressive osseous lesion. IMPRESSION: VASCULAR 1. Active gastrointestinal bleeding in the ascending colon. 2.  Aortic Atherosclerosis (ICD10-I70.0). NON-VASCULAR 1. Mild pan diverticulosis.  No evidence acute diverticulitis. Findings conveyed toSILAS WONG on 02/25/2022  at09:28. Electronically Signed   By: SSuzy BouchardM.D.   On: 02/25/2022 09:29    Labs:  CBC: Recent Labs    02/25/22 0654 02/25/22 1425  WBC 5.7 9.7  HGB 14.3 12.1  HCT 43.9 37.6  PLT 242 222    COAGS: Recent Labs    02/25/22 0654  INR 1.2    BMP: Recent Labs    02/25/22 0654  NA 140  K 3.3*  CL 109  CO2 23  GLUCOSE 133*  BUN 16  CALCIUM 9.2  CREATININE 1.09*  GFRNONAA 50*    LIVER FUNCTION TESTS: Recent Labs    02/25/22 0654  BILITOT 0.6  AST 22   ALT 20  ALKPHOS 67  PROT 7.2  ALBUMIN 3.9    TUMOR MARKERS: No results for input(s): "AFPTM", "CEA", "CA199", "CHROMGRNA" in the last 8760 hours.  Assessment and Plan:  Kaitlin BGilroyis 87yo female presenting with acute lower GI hemorrhage identified on CTA 9:30am today, localized to the right colon.   She has history of crohns, however, this seems more likely secondary to diverticular disease.    I had a lengthy discussion with her regarding the implications, pathophysiology, natural history, and possible treatments of lower GI hemorrhage. I did let her know that most likely the source is diverticular, and regarding the natural history,  there is frequently a on-again, off-again course.  We know where the source is based on the CT, but localization during angiogram can be difficult to have confidence with any possible embolization, This is often dependent on whether there is bleeding at the time of the angiogram.   I did mention surgery, but this is typically not front line anymore.   I do not see a role for colonoscopy at this point, given the CT localization.    Regarding the risks/benefits of angiogram, specific risks discussed include: bleeding, injury, infection, contrast reaction, contrast induced nephropathy, artery injury, internal bleeding, bowel ischemia, need for further procedure/surgery, need for repeat angio, non-target embolization, cardiopulmonary collapse, death.   After our discussion, I do think, based on my initial assessment/impression of her and current vital signs, that the bleeding has significantly slowed if not stopped.  However, given the CTA results today, I think attempting to localize this hemorrhage now and possibly treat is reasonable to facilitate her recovery.    She would like to proceed with angiogram and possible embolization.    All of the patient's questions were answered, patient is agreeable to proceed.  Consent signed and in chart.  Plan: -  plan for angio and possible embolization at this time  Electronically Signed: Corrie Mckusick, DO 02/25/2022, 3:50 PM   I spent a total of 15 Miinutes    in face to face in clinical consultation, greater than 50% of which was counseling/coordinating care for lower GI hemorrhage, possible embolization

## 2022-02-25 NOTE — Assessment & Plan Note (Addendum)
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 - hgb 14.3>12.1 -NPO -hold ASA  -type and cross -trend CBC -gentle IVF  -exam consistent with diverticulitis, but CTA shows no acute diverticulitis. - Colonoscopy in 2018 : Anal canal stenosis found on perianal exam. Patent end-to-end colo-colonic anastomosis, characterized by healthy appearing mucosa. Diverticulosis in the transverse colon and in the right colon.

## 2022-02-25 NOTE — Assessment & Plan Note (Signed)
Check magnesium Replete and trend

## 2022-02-25 NOTE — ED Notes (Signed)
EMTALA reviewed by this RN and all required documents up to date. Pt is ready for transfer.

## 2022-02-25 NOTE — Progress Notes (Signed)
Plan of Care Note for deferred transfer   Patient: Kaitlin [facility] MRN: [id number]   DOA: 02/25/2022  Facility requesting transfer: [facility] Requesting Provider: Jacelyn Grip Reason for transfer: GI bleeding  Facility course: Patient with h/o Crohn's, DM, HTN, and HLD presenting with BRBPR.  Stable patient with lower GI bleed.  Not on medication for Crohn's or AC.  BRBPR and clots since this AM.  CTA is positive in the ascending colon.  Vascular surgery there asked for transfer to [facility].  Consider ER:ER transfer based on lack of available progressive beds in the setting of active bleeding.  [facility] is happy to see her upon arrival.   Plan of care: The patient is deferred for admission at this time due to need for ER:ER transfer.   Author: Karmen Bongo, MD 02/25/2022  Check [email or web address] for on-call coverage.  Nursing staff, Please call [place] number on Amion as soon as patient's arrival, so appropriate admitting provider can evaluate the pt.

## 2022-02-25 NOTE — ED Triage Notes (Signed)
Pt went to use the bathroom this morning and states it was all bright red blood. Hx of GI bleeds. Denies weakness/fatigue/SOB/fever/pain.

## 2022-02-25 NOTE — ED Notes (Signed)
Patient accepted ED to ED

## 2022-02-25 NOTE — ED Provider Notes (Addendum)
[facility] Provider Note    Event Date/Time   First MD Initiated Contact with Patient 02/25/22 [phone]     (approximate)   History   Rectal Bleeding   HPI  Kaitlin Gomez is a 87 y.o. female   Past medical history of Crohn's disease, diabetes, hyperlipidemia, hypertension who presents with bright red blood per rectum and abdominal pain since this morning.  She was in her regular state of health last night because no medical complaints, no recent illnesses or fever.  She had a similar episode of bright red blood per rectum several years ago when she was admitted to the hospital.  Denies trauma.  Denies fever or chills.  Denies dysuria.    She does not take blood thinners.  History was obtained via the patient and a review of medical records including discharge summary from 2018 for lower GI bleeding.      Physical Exam   Triage Vital Signs: ED Triage Vitals  Enc Vitals Group     BP 02/25/22 0629 (!) 142/66     Pulse Rate 02/25/22 0629 78     Resp 02/25/22 0629 18     Temp 02/25/22 0629 (!) 97.5 F (36.4 C)     Temp Source 02/25/22 0629 Oral     SpO2 02/25/22 0629 100 %     Weight 02/25/22 0629 189 lb (85.7 kg)     Height 02/25/22 0629 '5\' 7"'$  (1.702 m)     Head Circumference --      Peak Flow --      Pain Score 02/25/22 0635 0     Pain Loc --      Pain Edu? --      Excl. in [place]? --     Most recent vital signs: Vitals:   02/25/22 0745 02/25/22 0905  BP: (!) 124/98 134/63  Pulse: (!) 49 (!) 47  Resp: 18 18  Temp:    SpO2: 100% 96%    General: Awake, no distress.  CV:  Good peripheral perfusion.  Resp:  Normal effort.  Abd:  No distention.  Mild left lower quadrant tenderness to palpation without rigidity or guarding Other:  External rectal exam appears normal, she did make a bowel movement in the toilet in her room with bright red blood with blood clots.   ED Results / Procedures / Treatments   Labs (all labs ordered are listed, but  only abnormal results are displayed) Labs Reviewed  COMPREHENSIVE METABOLIC PANEL - Abnormal; Notable for the following components:      Result Value   Potassium 3.3 (*)    Glucose, Bld 133 (*)    Creatinine, Ser 1.09 (*)    GFR, Estimated 50 (*)    All other components within normal limits  CBC  PROTIME-INR  POC OCCULT BLOOD, ED  TYPE AND SCREEN     I reviewed labs and they are notable for hemoglobin 14.3 which is improved from the 8.7 performed during her hospitalization in June 2018 for rectal bleeding.    RADIOLOGY I independently reviewed and interpreted CT angio of the abdomen and see no obvious inflammatory or obstructive processes   PROCEDURES:  Critical Care performed: No  Procedures   MEDICATIONS ORDERED IN ED: Medications  iohexol (OMNIPAQUE) 350 MG/ML injection 100 mL (100 mLs Intravenous Contrast Given 02/25/22 0839)    Consultants:  I spoke with hospitalist regarding admission and regarding care plan for this patient.   IMPRESSION / MDM / ASSESSMENT  AND PLAN / ED COURSE  I reviewed the triage vital signs and the nursing notes.                              Differential diagnosis includes, but is not limited to, flareup of her Crohn's disease, diverticulitis, lower GI bleeding, other intra-abdominal infection   The patient is on the cardiac monitor to evaluate for evidence of arrhythmia and/or significant heart rate changes.  MDM: Well-appearing patient with acute onset bright red blood per rectum this morning and some left lower quadrant abdominal pain, check H&H, keep on hemodynamic monitoring, check labs, CT angio of the abdomen pelvis   H&H is normal, hemodynamics appropriate and reassuring and patient remains comfortable.  Her CT angiogram does show an active bleed in the ascending colon.  The patient is not on blood thinners. I contacted hospitalist for admission and paged vascular surgery at this time for management of lower GI bleeding.  Dr  Trula Slade vascular surgery advises transfer to [facility], IR embolization of lower GI bleeding.  Transfer process initiated  Patient's presentation is most consistent with acute presentation with potential threat to life or bodily function.       FINAL CLINICAL IMPRESSION(S) / ED DIAGNOSES   Final diagnoses:  Lower GI bleed     Rx / DC Orders   ED Discharge Orders     None        Note:  This document was prepared using Dragon voice recognition software and may include unintentional dictation errors.    Lucillie Garfinkel, MD 02/25/22 [place], MD 02/25/22 1004

## 2022-02-25 NOTE — Progress Notes (Signed)
Called by ED physician re: [facility] with CTA showing positive bleeding right colon and pandiverticulosis.  IR consulted and will see (Dr. Earleen Newport aware).  We will put her on our list and remain on standby.  Please call us if there is need for consideration of colonoscopy or other GI assistance pending outcome of probable angiogram with IR.  - H. Loletha Carrow, MD

## 2022-02-25 NOTE — ED Notes (Signed)
Report given to [facility] at this time.   

## 2022-02-25 NOTE — Assessment & Plan Note (Signed)
No visible labs in our system or care everywhere since 2018 Repeat a1C pending Hold metformin Sensitive SSI and accuchecks qac/hs while NPO

## 2022-02-25 NOTE — Assessment & Plan Note (Addendum)
Patient was consistently in the 67s SR at [facility] She dropped down to 36-40s while in room, baseline around 50 She denies any dizziness, postural dizziness, shortness of breath, DOE. Has some lightheadedness at times  Hold beta blocker for wash out, clonidine likely big culprit, will try to wean and continue outpatient  Check TSH  Check echo/troponin  If no improvement consult cards

## 2022-02-25 NOTE — Assessment & Plan Note (Signed)
Continue lovastatin 

## 2022-02-25 NOTE — H&P (Signed)
History and Physical    Patient: Kaitlin Gomez:[id number] DOB: 07-09-1934 DOA: 02/25/2022 DOS: the patient was seen and examined on 02/25/2022 PCP: Marguerita Merles, MD  Patient coming from: [facility] - She lives with her son. Uses cane    Chief Complaint: BRBPR  HPI: Kaitlin Gomez is a 87 y.o. female with medical history significant of chron's disease s/p sigmoid colectomy with a colovesical fistula repair on 08/09/1993, T2DM, HLD, HTN who presented to ED with complaints of multiple bright red bloody stools that started this AM. She went to [facility] and was found to have active bleed in her ascending colon and was sent to [facility] ED for work up with IR.  She states it started at 86AM today and has had more episodes than she can count of bright red bloody stool. Sometimes it was clots. She has some lightheadedness, no dizziness. She has some lower mid abdominal  pain that is rated as a 4/10 that has been going on for a few days. Pain is intermittent. She has no pain with bowel movements. No fever/chills. No N/V. Her last chron's flair was 2018 and she has not seen any GI physician since that time. She is only on ASA and last took this yesterday morning.   Colonoscopy 10/2016: Anal canal stenosis found on perianal exam. - Patent end-to-end colo-colonic anastomosis, characterized by healthy appearing mucosa. - Diverticulosis in the transverse colon and in the right colon. - The examination was otherwise normal on direct and retroflexion views.   Denies any fever/chills, vision changes/headaches, chest pain or palpitations, shortness of breath or cough, abdominal pain, N/V/D, dysuria or leg swelling.    She does not smoke or drink.    ER Course:  vitals: afebrile, bp: 140/127, HR: 55, RR: 17, oxygen: 100%RA Pertinent labs: potassium: 3.3, creatinine: 1.09,  CTA abdomen/pelvis: active GI bleeding in the ascending colon.  In ED: IR/GI consulted. TRH asked to admit.    Review of Systems: As mentioned in  the history of present illness. All other systems reviewed and are negative. Past Medical History:  Diagnosis Date   Crohn's disease ([place])    Diabetes mellitus without complication ([place])    Hyperlipemia    Hypertension    Past Surgical History:  Procedure Laterality Date   COLONOSCOPY WITH PROPOFOL N/A 01/05/2015   Procedure: COLONOSCOPY WITH PROPOFOL;  Surgeon: Lollie Sails, MD;  Location: [facility] ENDOSCOPY;  Service: Endoscopy;  Laterality: N/A;   COLONOSCOPY WITH PROPOFOL N/A 11/02/2016   Procedure: COLONOSCOPY WITH PROPOFOL;  Surgeon: Jonathon Bellows, MD;  Location: [facility] ENDOSCOPY;  Service: Endoscopy;  Laterality: N/A;   Social History:  reports that she has never smoked. She has never used smokeless tobacco. She reports that she does not drink alcohol and does not use drugs.  Allergies  Allergen Reactions   Advil [Ibuprofen] Rash    Family History  Problem Relation Age of Onset   Hypertension Father     Prior to Admission medications   Medication Sig Start Date End Date Taking? Authorizing Provider  amLODipine (NORVASC) 10 MG tablet Take 10 mg by mouth daily.    [provider]  atenolol (TENORMIN) 50 MG tablet Take 50 mg by mouth daily.    [provider]  calcium-vitamin D (OSCAL 500/200 D-3) 500-200 MG-UNIT per tablet Take 1 tablet by mouth 2 (two) times daily.    [provider]  cloNIDine (CATAPRES) 0.3 MG tablet Take 0.3 mg by mouth at bedtime.  [provider]  ferrous sulfate 325 (65 FE) MG tablet Take 1 tablet (325 mg total) by mouth 2 (two) times daily with a meal. 11/02/16   Sudini, Alveta Heimlich, MD  furosemide (LASIX) 40 MG tablet Take 40 mg by mouth daily.    [provider]  lovastatin (MEVACOR) 40 MG tablet Take 40 mg by mouth at bedtime.    [provider]  metFORMIN (GLUCOPHAGE-XR) 500 MG 24 hr tablet Take 500 mg by mouth daily. 10/09/16   [provider]  quinapril (ACCUPRIL) 40 MG tablet Take 40 mg by  mouth 2 (two) times daily.    [provider]  sulfaSALAzine (AZULFIDINE) 500 MG tablet Take 500 mg by mouth 4 (four) times daily. 10/09/16   [provider]    Physical Exam: Vitals:   02/25/22 1745 02/25/22 1750 02/25/22 1755 02/25/22 1757  BP: (!) 134/54 (!) 140/54 (!) 143/90   Pulse: (!) 52 (!) 50 (!) 54 (!) 54  Resp:      Temp:      TempSrc:      SpO2: 100% 100% 100% 99%   General:  Appears calm and comfortable and is in NAD Eyes:  PERRL, EOMI, normal lids, iris ENT:  grossly normal hearing, lips & tongue, mmm; appropriate dentition Neck:  no LAD, masses or thyromegaly; no carotid bruits, no JVD Cardiovascular:  Rate bradycardic, normal rhythm, no m/r/g. BLE edema Respiratory:   CTA bilaterally with no wheezes/rales/rhonchi.  Normal respiratory effort. Abdomen:  soft, TTP over left lower quadrant, no rebound or guarding, ND, NABS Back:   normal alignment, no CVAT Skin:  no rash or induration seen on limited exam Musculoskeletal:  grossly normal tone BUE/BLE, good ROM, no bony abnormality Lower extremity: Limited foot exam with no ulcerations.  2+ distal pulses. Psychiatric:  grossly normal mood and affect, speech fluent and appropriate, AOx3 Neurologic:  CN 2-12 grossly intact, moves all extremities in coordinated fashion, sensation intact   Radiological Exams on Admission: Independently reviewed - see discussion in A/P where applicable  CT Angio Abd/Pel W and/or Wo Contrast  Result Date: 02/25/2022 CLINICAL DATA:  Bright red blood per rectum. History of gastrointestinal hemorrhage. EXAM: CTA ABDOMEN AND PELVIS WITHOUT AND WITH CONTRAST TECHNIQUE: Multidetector CT imaging of the abdomen and pelvis was performed using the standard protocol during bolus administration of intravenous contrast. Multiplanar reconstructed images and MIPs were obtained and reviewed to evaluate the vascular anatomy. RADIATION DOSE REDUCTION: This exam was performed according to the  departmental dose-optimization program which includes automated exposure control, adjustment of the mA and/or kV according to patient size and/or use of iterative reconstruction technique. CONTRAST:  150m OMNIPAQUE IOHEXOL 350 MG/ML SOLN COMPARISON:  CT 01/23/2015 FINDINGS: VASCULAR Aorta: Normal caliber aorta without aneurysm, dissection, vasculitis or significant stenosis. Mild intimal calcification aorta is branches Celiac: Patent without evidence of aneurysm, dissection, vasculitis or significant stenosis. SMA: Patent without evidence of aneurysm, dissection, vasculitis or significant stenosis. Renals: Both renal arteries are patent without evidence of aneurysm, dissection, vasculitis, fibromuscular dysplasia or significant stenosis. Small accessory renal arteries on LEFT and RIGHT. IMA: Patent without evidence of aneurysm, dissection, vasculitis or significant stenosis. Inflow: Patent without evidence of aneurysm, dissection, vasculitis or significant stenosis. Proximal Outflow: Bilateral common femoral and visualized portions of the superficial and profunda femoral arteries are patent without evidence of aneurysm, dissection, vasculitis or significant stenosis. Veins: No obvious venous abnormality within the limitations of this arterial phase study. Review of the MIP images confirms the above  findings. NON-VASCULAR Lower chest: Lung bases are clear. Hepatobiliary: No focal hepatic lesion. No biliary duct dilatation. Common bile duct is normal. Pancreas: Pancreas is normal. No ductal dilatation. No pancreatic inflammation. Spleen: Normal spleen Adrenals/urinary tract: Adrenal glands and kidneys are normal. The ureters and bladder normal. Stomach/Bowel: No evidence of active gastrointestinal bleeding within the stomach, duodenum or small bowel. The terminal ileum is normal. Within the midportion of the ascending colon there is a focus of high-density contrast within the dependent lumen of the ascending colon  (image 108/series 9) which is not present on the noncontrast CT series. On the more delayed venous phase imaging, this intraluminal contrast increases and pools dependently and spreads antegrade. No evidence of additional active hemorrhage within the LEFT colon or rectosigmoid colon. Multiple diverticula through the colon. Vascular/Lymphatic: No periportal or retroperitoneal adenopathy. No pelvic adenopathy. Reproductive: Post hysterectomy.  Adnexa unremarkable Other: No free fluid. Musculoskeletal: No aggressive osseous lesion. IMPRESSION: VASCULAR 1. Active gastrointestinal bleeding in the ascending colon. 2.  Aortic Atherosclerosis (ICD10-I70.0). NON-VASCULAR 1. Mild pan diverticulosis.  No evidence acute diverticulitis. Findings conveyed toSILAS WONG on 02/25/2022  at09:28. Electronically Signed   By: Suzy Bouchard M.D.   On: 02/25/2022 09:29    EKG: Independently reviewed. bradycardic with rate 47; nonspecific ST changes with no evidence of acute ischemia   Labs on Admission: I have personally reviewed the available labs and imaging studies at the time of the admission.  Pertinent labs:   potassium: 3.3,  creatinine: 1.09  Assessment and Plan: Principal Problem:   Lower GI bleed Active Problems:   Sinus bradycardia   Hypokalemia   Crohn's disease (HCC)   Diabetes (HCC)   HTN (hypertension)   Hyperlipidemia    Assessment and Plan: * Lower GI bleed 87 year old female with history of Chron's disease s/p sigmoid colectomy with a colovesical fistula repair on 08/09/1993 and diverticulosis who presented to ED with 1 day history of acute onset of bright red bloody stool/clots and left lower abdominal pain found to have an active bleed in ascending colon on CTA abdomen/pelvis -admit to progressive  -IR/GI have been consulted -IR for angiogram and intervention today  -hemodynamically stable. - hgb 14.3>12.1 -NPO -hold ASA  -type and cross -trend CBC -gentle IVF  -exam consistent with  diverticulitis, but CTA shows no acute diverticulitis. - Colonoscopy in 2018 : Anal canal stenosis found on perianal exam. Patent end-to-end colo-colonic anastomosis, characterized by healthy appearing mucosa. Diverticulosis in the transverse colon and in the right colon.    Sinus bradycardia Patient was consistently in the 44s SR at [facility] She dropped down to 36-40s while in room, baseline around 50 She denies any dizziness, postural dizziness, shortness of breath, DOE. Has some lightheadedness at times  Hold beta blocker for wash out, clonidine likely big culprit, will try to wean and continue outpatient  Check TSH  Check echo/troponin  If no improvement consult cards   Hypokalemia Check magnesium Replete and trend   Crohn's disease ([place]) Has not seen GI since 2018 during last flair, she has had no further flairs since that time  No evidence of inflammation CT GI consulted   Diabetes ([place]) No visible labs in our system or care everywhere since 2018 Repeat a1C pending Hold metformin Sensitive SSI and accuchecks qac/hs while NPO   HTN (hypertension) 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 -will attempt to  wean down clonidine as likely contributing to her bradycardia as well  -continue norvasc -hold lasix and accupril with normotensive readings and add back as needed  Prn IV hydralazine for sbp >180  Hyperlipidemia Continue lovastatin     Advance Care Planning:   Code Status: Full Code   Consults: GI/IR-Dr. Jacqualyn Posey   DVT Prophylaxis: SCDs   Family Communication: none  Severity of Illness: The appropriate patient status for this patient is INPATIENT. Inpatient status is judged to be reasonable and necessary in order to provide the required intensity of service to ensure the patient's safety. The patient's presenting symptoms, physical  exam findings, and initial radiographic and laboratory data in the context of their chronic comorbidities is felt to place them at high risk for further clinical deterioration. Furthermore, it is not anticipated that the patient will be medically stable for discharge from the hospital within 2 midnights of admission.   * I certify that at the point of admission it is my clinical judgment that the patient will require inpatient hospital care spanning beyond 2 midnights from the point of admission due to high intensity of service, high risk for further deterioration and high frequency of surveillance required.*  Author: Orma Flaming, MD 02/25/2022 6:05 PM  For on call review [email or web address].

## 2022-02-25 NOTE — ED Triage Notes (Signed)
Patient transferred was Kaitlin Gomez for GI bleed.

## 2022-02-25 NOTE — ED Provider Notes (Signed)
[facility] EMERGENCY DEPARTMENT Provider Note   CSN: [id number] Arrival date & time: 02/25/22  1301     History  Chief Complaint  Patient presents with   Blood In Stools    RAAHI KORBER is a 87 y.o. female.  HPI Patient presents for bright red blood per rectum.  Medical history includes Crohn's disease, DM, HLD, HTN.  She was seen this morning at [facility] for abdominal pain and hematochezia.  Onset was this morning.  Initial hemoglobin was 14.3.  CTA showed active bleeding in ascending colon.  This was discussed with Dr. Trula Slade who advised transfer to [facility] for IR embolization.  She has had continued hematochezia.  Last episode was shortly prior to transfer.  She endorses stable, unchanged lower abdominal discomfort.  She denies any nausea.  Patient does not take any anticoagulation.  She does take a baby aspirin.  Last dose of aspirin was yesterday morning.    Home Medications Prior to Admission medications   Medication Sig Start Date End Date Taking? Authorizing Provider  amLODipine (NORVASC) 10 MG tablet Take 10 mg by mouth daily.    [provider]  atenolol (TENORMIN) 50 MG tablet Take 50 mg by mouth daily.    [provider]  calcium-vitamin D (OSCAL 500/200 D-3) 500-200 MG-UNIT per tablet Take 1 tablet by mouth 2 (two) times daily.    [provider]  cloNIDine (CATAPRES) 0.3 MG tablet Take 0.3 mg by mouth at bedtime.     [provider]  ferrous sulfate 325 (65 FE) MG tablet Take 1 tablet (325 mg total) by mouth 2 (two) times daily with a meal. 11/02/16   Sudini, Alveta Heimlich, MD  furosemide (LASIX) 40 MG tablet Take 40 mg by mouth daily.    [provider]  lovastatin (MEVACOR) 40 MG tablet Take 40 mg by mouth at bedtime.    [provider]  metFORMIN (GLUCOPHAGE-XR) 500 MG 24 hr tablet Take 500 mg by mouth daily. 10/09/16   [provider]  quinapril (ACCUPRIL) 40 MG  tablet Take 40 mg by mouth 2 (two) times daily.    [provider]  sulfaSALAzine (AZULFIDINE) 500 MG tablet Take 500 mg by mouth 4 (four) times daily. 10/09/16   [provider]      Allergies    Advil [ibuprofen]    Review of Systems   Review of Systems  Gastrointestinal:  Positive for abdominal pain and blood in stool.  All other systems reviewed and are negative.   Physical Exam Updated Vital Signs BP (!) 140/127   Pulse (!) 55   Temp 98 F (36.7 C) (Oral)   Resp 17   SpO2 100%  Physical Exam Vitals and nursing note reviewed.  Constitutional:      General: She is not in acute distress.    Appearance: Normal appearance. She is well-developed. She is not ill-appearing, toxic-appearing or diaphoretic.  HENT:     Head: Normocephalic and atraumatic.     Right Ear: External ear normal.     Left Ear: External ear normal.     Nose: Nose normal.     Mouth/Throat:     Mouth: Mucous membranes are moist.     Pharynx: Oropharynx is clear.  Eyes:     Extraocular Movements: Extraocular movements intact.     Conjunctiva/sclera: Conjunctivae normal.  Cardiovascular:     Rate and Rhythm: Normal rate and regular rhythm.  Pulmonary:  Effort: Pulmonary effort is normal. No respiratory distress.  Abdominal:     Palpations: Abdomen is soft.     Tenderness: There is abdominal tenderness. There is no guarding or rebound.  Musculoskeletal:        General: No swelling. Normal range of motion.     Cervical back: Normal range of motion and neck supple.  Skin:    General: Skin is warm and dry.     Capillary Refill: Capillary refill takes less than 2 seconds.     Coloration: Skin is not jaundiced or pale.  Neurological:     General: No focal deficit present.     Mental Status: She is alert and oriented to person, place, and time.  Psychiatric:        Mood and Affect: Mood normal.        Behavior: Behavior normal.        Thought Content: Thought content normal.         Judgment: Judgment normal.     ED Results / Procedures / Treatments   Labs (all labs ordered are listed, but only abnormal results are displayed) Labs Reviewed  CBC  MAGNESIUM  CBC  CBC  CBC  HEMOGLOBIN A1C    EKG None  Radiology CT Angio Abd/Pel W and/or Wo Contrast  Result Date: 02/25/2022 CLINICAL DATA:  Bright red blood per rectum. History of gastrointestinal hemorrhage. EXAM: CTA ABDOMEN AND PELVIS WITHOUT AND WITH CONTRAST TECHNIQUE: Multidetector CT imaging of the abdomen and pelvis was performed using the standard protocol during bolus administration of intravenous contrast. Multiplanar reconstructed images and MIPs were obtained and reviewed to evaluate the vascular anatomy. RADIATION DOSE REDUCTION: This exam was performed according to the departmental dose-optimization program which includes automated exposure control, adjustment of the mA and/or kV according to patient size and/or use of iterative reconstruction technique. CONTRAST:  186m OMNIPAQUE IOHEXOL 350 MG/ML SOLN COMPARISON:  CT 01/23/2015 FINDINGS: VASCULAR Aorta: Normal caliber aorta without aneurysm, dissection, vasculitis or significant stenosis. Mild intimal calcification aorta is branches Celiac: Patent without evidence of aneurysm, dissection, vasculitis or significant stenosis. SMA: Patent without evidence of aneurysm, dissection, vasculitis or significant stenosis. Renals: Both renal arteries are patent without evidence of aneurysm, dissection, vasculitis, fibromuscular dysplasia or significant stenosis. Small accessory renal arteries on LEFT and RIGHT. IMA: Patent without evidence of aneurysm, dissection, vasculitis or significant stenosis. Inflow: Patent without evidence of aneurysm, dissection, vasculitis or significant stenosis. Proximal Outflow: Bilateral common femoral and visualized portions of the superficial and profunda femoral arteries are patent without evidence of aneurysm, dissection, vasculitis or  significant stenosis. Veins: No obvious venous abnormality within the limitations of this arterial phase study. Review of the MIP images confirms the above findings. NON-VASCULAR Lower chest: Lung bases are clear. Hepatobiliary: No focal hepatic lesion. No biliary duct dilatation. Common bile duct is normal. Pancreas: Pancreas is normal. No ductal dilatation. No pancreatic inflammation. Spleen: Normal spleen Adrenals/urinary tract: Adrenal glands and kidneys are normal. The ureters and bladder normal. Stomach/Bowel: No evidence of active gastrointestinal bleeding within the stomach, duodenum or small bowel. The terminal ileum is normal. Within the midportion of the ascending colon there is a focus of high-density contrast within the dependent lumen of the ascending colon (image 108/series 9) which is not present on the noncontrast CT series. On the more delayed venous phase imaging, this intraluminal contrast increases and pools dependently and spreads antegrade. No evidence of additional active hemorrhage within the LEFT colon or rectosigmoid colon. Multiple diverticula through  the colon. Vascular/Lymphatic: No periportal or retroperitoneal adenopathy. No pelvic adenopathy. Reproductive: Post hysterectomy.  Adnexa unremarkable Other: No free fluid. Musculoskeletal: No aggressive osseous lesion. IMPRESSION: VASCULAR 1. Active gastrointestinal bleeding in the ascending colon. 2.  Aortic Atherosclerosis (ICD10-I70.0). NON-VASCULAR 1. Mild pan diverticulosis.  No evidence acute diverticulitis. Findings conveyed toSILAS WONG on 02/25/2022  at09:28. Electronically Signed   By: Suzy Bouchard M.D.   On: 02/25/2022 09:29    Procedures Procedures    Medications Ordered in ED Medications  lactated ringers infusion (has no administration in time range)  insulin aspart (novoLOG) injection 0-9 Units (has no administration in time range)    ED Course/ Medical Decision Making/ A&P                           Medical  Decision Making Amount and/or Complexity of Data Reviewed Labs: ordered.   This patient presents to the ED for concern of hematochezia, this involves an extensive number of treatment options, and is a complaint that carries with it a high risk of complications and morbidity.  The differential diagnosis includes diverticular bleed, hemorrhoids, Crohn's flare, upper GI bleed with rapid transit   Co morbidities that complicate the patient evaluation  Crohn's disease, DM, HLD, HTN   Additional history obtained:  Additional history obtained from N/A External records from outside source obtained and reviewed including EMR   Lab Tests:  Labs from this morning showed normal hemoglobin of 14.3.  There is no leukocytosis.  Platelet count is normal.   Imaging Studies ordered:  CTA from this morning shows active GI bleeding in ascending colon.  Diverticulosis is noted. I agree with the radiologist interpretation   Cardiac Monitoring: / EKG:  The patient was maintained on a cardiac monitor.  I personally viewed and interpreted the cardiac monitored which showed an underlying rhythm of: Sinus rhythm   Consultations Obtained:  I requested consultation with the interventional radiologist, Dr. Earleen Newport,  and discussed lab and imaging findings as well as pertinent plan - they recommend: Keep n.p.o., admit to hospitalist, get gastroenterology involved, and IR will see in consult.   Problem List / ED Course / Critical interventions / Medication management  Patient is a pleasant 87 year old female presenting for hematochezia.  Onset was this morning.  She was initially seen at [facility] where she underwent CTA of abdomen.  CTA showed active bleed in ascending colon.  This was discussed with Dr. Trula Slade who advised ED to ED transfer for IR consult.  Patient is hemodynamically stable on arrival.  She is well-appearing.  She endorses some unchanged lower abdominal pain.  There is  associated tenderness without guarding.  Hemoglobin this morning was greater than 14.  Repeat CBC was ordered.  I discussed with interventional radiologist on-call, Dr. Earleen Newport, who advises hospitalist admission.  Interventional radiology will see in consult.  Patient to be kept n.p.o. in the meantime.  Gastroenterology was consulted and they will follow the patient while admitted.  Patient was admitted to medicine for further management.   Social Determinants of Health:  Lives independently         Final Clinical Impression(s) / ED Diagnoses Final diagnoses:  Lower GI bleed    Rx / DC Orders ED Discharge Orders     None         Godfrey Pick, MD 02/25/22 1432

## 2022-02-25 NOTE — ED Notes (Signed)
Patient noted to have multiple BM's with bright red blood in toilet.

## 2022-02-26 ENCOUNTER — Inpatient Hospital Stay (HOSPITAL_COMMUNITY): Payer: Medicare HMO

## 2022-02-26 ENCOUNTER — Encounter (HOSPITAL_COMMUNITY): Payer: Self-pay | Admitting: Family Medicine

## 2022-02-26 DIAGNOSIS — K5090 Crohn's disease, unspecified, without complications: Secondary | ICD-10-CM | POA: Diagnosis not present

## 2022-02-26 DIAGNOSIS — R001 Bradycardia, unspecified: Secondary | ICD-10-CM

## 2022-02-26 DIAGNOSIS — K922 Gastrointestinal hemorrhage, unspecified: Secondary | ICD-10-CM | POA: Diagnosis not present

## 2022-02-26 DIAGNOSIS — E876 Hypokalemia: Secondary | ICD-10-CM | POA: Diagnosis not present

## 2022-02-26 DIAGNOSIS — I10 Essential (primary) hypertension: Secondary | ICD-10-CM

## 2022-02-26 LAB — CBC WITH DIFFERENTIAL/PLATELET
Abs Immature Granulocytes: 0.04 10*3/uL (ref 0.00–0.07)
Basophils Absolute: 0 10*3/uL (ref 0.0–0.1)
Basophils Relative: 0 %
Eosinophils Absolute: 0 10*3/uL (ref 0.0–0.5)
Eosinophils Relative: 0 %
HCT: 30.6 % — ABNORMAL LOW (ref 36.0–46.0)
Hemoglobin: 10.2 g/dL — ABNORMAL LOW (ref 12.0–15.0)
Immature Granulocytes: 0 %
Lymphocytes Relative: 14 %
Lymphs Abs: 1.6 10*3/uL (ref 0.7–4.0)
MCH: 31.1 pg (ref 26.0–34.0)
MCHC: 33.3 g/dL (ref 30.0–36.0)
MCV: 93.3 fL (ref 80.0–100.0)
Monocytes Absolute: 0.6 10*3/uL (ref 0.1–1.0)
Monocytes Relative: 5 %
Neutro Abs: 9.2 10*3/uL — ABNORMAL HIGH (ref 1.7–7.7)
Neutrophils Relative %: 81 %
Platelets: 214 10*3/uL (ref 150–400)
RBC: 3.28 MIL/uL — ABNORMAL LOW (ref 3.87–5.11)
RDW: 13.8 % (ref 11.5–15.5)
WBC: 11.4 10*3/uL — ABNORMAL HIGH (ref 4.0–10.5)
nRBC: 0 % (ref 0.0–0.2)

## 2022-02-26 LAB — CBC
HCT: 32.1 % — ABNORMAL LOW (ref 36.0–46.0)
Hemoglobin: 10.9 g/dL — ABNORMAL LOW (ref 12.0–15.0)
MCH: 31.4 pg (ref 26.0–34.0)
MCHC: 34 g/dL (ref 30.0–36.0)
MCV: 92.5 fL (ref 80.0–100.0)
Platelets: 209 10*3/uL (ref 150–400)
RBC: 3.47 MIL/uL — ABNORMAL LOW (ref 3.87–5.11)
RDW: 13.7 % (ref 11.5–15.5)
WBC: 10.7 10*3/uL — ABNORMAL HIGH (ref 4.0–10.5)
nRBC: 0 % (ref 0.0–0.2)

## 2022-02-26 LAB — GLUCOSE, CAPILLARY
Glucose-Capillary: 143 mg/dL — ABNORMAL HIGH (ref 70–99)
Glucose-Capillary: 111 mg/dL — ABNORMAL HIGH (ref 70–99)

## 2022-02-26 LAB — ECHOCARDIOGRAM COMPLETE
AR max vel: 2.01 cm2
AV Area VTI: 2.1 cm2
AV Area mean vel: 1.67 cm2
AV Mean grad: 4 mmHg
AV Peak grad: 7.6 mmHg
Ao pk vel: 1.38 m/s
Area-P 1/2: 2.99 cm2
Calc EF: 65.5 %
Height: 67 in
S' Lateral: 3.4 cm
Single Plane A2C EF: 62.1 %
Single Plane A4C EF: 68 %
Weight: 3024 oz

## 2022-02-26 LAB — BASIC METABOLIC PANEL
Anion gap: 10 (ref 5–15)
BUN: 12 mg/dL (ref 8–23)
CO2: 24 mmol/L (ref 22–32)
Calcium: 8.8 mg/dL — ABNORMAL LOW (ref 8.9–10.3)
Chloride: 108 mmol/L (ref 98–111)
Creatinine, Ser: 1 mg/dL (ref 0.44–1.00)
GFR, Estimated: 55 mL/min — ABNORMAL LOW (ref >60)
Glucose, Bld: 158 mg/dL — ABNORMAL HIGH (ref 70–99)
Potassium: 3.6 mmol/L (ref 3.5–5.1)
Sodium: 142 mmol/L (ref 135–145)

## 2022-02-26 LAB — CBG MONITORING, ED
Glucose-Capillary: 160 mg/dL — ABNORMAL HIGH (ref 70–99)
Glucose-Capillary: 165 mg/dL — ABNORMAL HIGH (ref 70–99)
Glucose-Capillary: 155 mg/dL — ABNORMAL HIGH (ref 70–99)

## 2022-02-26 LAB — TROPONIN I (HIGH SENSITIVITY): Troponin I (High Sensitivity): 15 ng/L (ref <18)

## 2022-02-26 MED ORDER — PRAVASTATIN SODIUM 40 MG PO TABS
40.0000 mg | ORAL_TABLET | Freq: Every day | ORAL | Status: DC
  Administered 2022-02-26 – 2022-03-06 (×8): 40 mg via ORAL
  Filled 2022-02-26 (×8): qty 1

## 2022-02-26 MED ORDER — AMLODIPINE BESYLATE 10 MG PO TABS
10.0000 mg | ORAL_TABLET | Freq: Every day | ORAL | Status: DC
  Administered 2022-02-26 – 2022-03-03 (×5): 10 mg via ORAL
  Filled 2022-02-26 (×6): qty 1

## 2022-02-26 MED ORDER — ATENOLOL 50 MG PO TABS
50.0000 mg | ORAL_TABLET | Freq: Every day | ORAL | Status: DC
  Administered 2022-02-26 – 2022-03-03 (×5): 50 mg via ORAL
  Filled 2022-02-26 (×6): qty 1

## 2022-02-26 MED ORDER — ONDANSETRON HCL 4 MG/2ML IJ SOLN
4.0000 mg | Freq: Four times a day (QID) | INTRAMUSCULAR | Status: DC | PRN
  Administered 2022-02-26: 4 mg via INTRAVENOUS
  Filled 2022-02-26: qty 2

## 2022-02-26 MED ORDER — CLONIDINE HCL 0.2 MG PO TABS
0.2000 mg | ORAL_TABLET | Freq: Every day | ORAL | Status: DC
  Administered 2022-02-26 – 2022-03-03 (×6): 0.2 mg via ORAL
  Filled 2022-02-26 (×6): qty 1

## 2022-02-26 NOTE — Evaluation (Signed)
Physical Therapy Evaluation Patient Details Name: Kaitlin Gomez MRN: [id number] DOB: May 08, 1935 Today's Date: 02/26/2022  History of Present Illness  pt is an 87 y/o female admitte as a tranfer from [facility] to ED 10/1 for work up in IR for active bleed in her ascending colon.  Same day s/p US guided cmbolization of the vasa recta supplying pt's diverticular bleed right colon.  PMHx: DM2, HLD, HTN, chron's ds s/p sigmoid colectormy.  Clinical Impression  Pt admitted with/for the complications related with intervention above.  Pt is not at her baseline functioning and does feel anemic and mildly unsteady, needing min to min guard at worst for basic mobility/gait  Pt currently limited functionally due to the problems listed below.  (see problems list.)  Pt will benefit from PT to maximize function and safety to be able to get home safely with available assist .        Recommendations for follow up therapy are one component of a multi-disciplinary discharge planning process, led by the attending physician.  Recommendations may be updated based on patient status, additional functional criteria and insurance authorization.  Follow Up Recommendations Home health PT      Assistance Recommended at Discharge Set up Supervision/Assistance  Patient can return home with the following  Assistance with cooking/housework;Assist for transportation    Equipment Recommendations None recommended by PT  Recommendations for Other Services       Functional Status Assessment Patient has had a recent decline in their functional status and demonstrates the ability to make significant improvements in function in a reasonable and predictable amount of time.     Precautions / Restrictions Precautions Precautions: Fall      Mobility  Bed Mobility Overal bed mobility: Needs Assistance Bed Mobility: Supine to Sit, Sit to Supine     Supine to sit: Min guard Sit to supine: Min assist   General bed mobility  comments: needs extra time today, min assist to get legs into bed, but given minutes probably would have accomplished all without assist.    Transfers Overall transfer level: Needs assistance   Transfers: Sit to/from Stand Sit to Stand: Min assist           General transfer comment: effortful at lower surface of the toilet, but min guard from the bed.    Ambulation/Gait Ambulation/Gait assistance: Min assist, Min guard Gait Distance (Feet): 15 Feet (x2) Assistive device: Rolling walker (2 wheels) Gait Pattern/deviations: Step-through pattern   Gait velocity interpretation: <1.31 ft/sec, indicative of household ambulator   General Gait Details: mildly unsteady overall with deviations, but no overt LOB.  Pt reports she feels about 40-50% of her normal today.  Stairs            Wheelchair Mobility    Modified Rankin (Stroke Patients Only)       Balance Overall balance assessment: Needs assistance Sitting-balance support: Feet supported, No upper extremity supported Sitting balance-Leahy Scale: Fair       Standing balance-Leahy Scale: Fair Standing balance comment: washing hands with soap and drying without UE assist                             Pertinent Vitals/Pain Pain Assessment Pain Assessment: No/denies pain    Home Living Family/patient expects to be discharged to:: Private residence Living Arrangements: Children Available Help at Discharge: Family;Available 24 hours/day Type of Home: House Home Access: Level entry  Home Layout: One level Home Equipment: Cane - single point      Prior Function Prior Level of Function : Independent/Modified Independent                     Hand Dominance        Extremity/Trunk Assessment   Upper Extremity Assessment Upper Extremity Assessment: Overall WFL for tasks assessed    Lower Extremity Assessment Lower Extremity Assessment: Generalized weakness    Cervical / Trunk  Assessment Cervical / Trunk Assessment: Normal  Communication   Communication: No difficulties  Cognition Arousal/Alertness: Awake/alert Behavior During Therapy: WFL for tasks assessed/performed Overall Cognitive Status: Within Functional Limits for tasks assessed                                          General Comments General comments (skin integrity, edema, etc.): HR  rose drastically from 90/100's to sustained 140's/150's with spicke to 177 bpm,  Pt was unaware.    Exercises     Assessment/Plan    PT Assessment Patient needs continued PT services  PT Problem List Decreased strength;Decreased activity tolerance;Decreased balance;Decreased mobility       PT Treatment Interventions DME instruction;Gait training;Functional mobility training;Therapeutic activities;Patient/family education;Balance training    PT Goals (Current goals can be found in the Care Plan section)  Acute Rehab PT Goals Patient Stated Goal: feeling that I can be independent PT Goal Formulation: With patient Time For Goal Achievement: 03/12/22 Potential to Achieve Goals: Good    Frequency Min 3X/week     Co-evaluation               AM-PAC PT "6 Clicks" Mobility  Outcome Measure Help needed turning from your back to your side while in a flat bed without using bedrails?: A Little Help needed moving from lying on your back to sitting on the side of a flat bed without using bedrails?: A Little Help needed moving to and from a bed to a chair (including a wheelchair)?: A Little Help needed standing up from a chair using your arms (e.g., wheelchair or bedside chair)?: A Little Help needed to walk in hospital room?: A Little Help needed climbing 3-5 steps with a railing? : A Little 6 Click Score: 18    End of Session   Activity Tolerance: Patient tolerated treatment well Patient left: in bed;with call bell/phone within reach Nurse Communication: Mobility status PT Visit  Diagnosis: Unsteadiness on feet (R26.81);Muscle weakness (generalized) (M62.81)    Time: 1650-1706 PT Time Calculation (min) (ACUTE ONLY): 16 min   Charges:   PT Evaluation $PT Eval Low Complexity: 1 Low          02/26/2022  Ginger Carne., PT Acute Rehabilitation Services [phone]  (office)  Tessie Fass Nou Chard 02/26/2022, 5:25 PM

## 2022-02-26 NOTE — ED Notes (Signed)
Checked patient cbg it was 155 notified RN of blood sugar patient is resting with call bell in reach  

## 2022-02-26 NOTE — ED Notes (Signed)
Patient ambulatory to the bathroom.

## 2022-02-26 NOTE — ED Notes (Signed)
Echo at bedside

## 2022-02-26 NOTE — Progress Notes (Signed)
Progress Note  Patient: Kaitlin Gomez BDZ:[id number] DOB: 02/16/1935  DOA: 02/25/2022  DOS: 02/26/2022    Brief hospital course: DOMINICA KENT is an 87 y.o. female with a history of Crohn's disease s/p sigmoid colectomy, colovesical fistula repair 1995 at [facility], T2DM, HTN, HLD who presented to [facility] with several episodes of hematochezia found to have active bleeding in the ascending colon by CT angiography, transferred to [facility] for IR evaluation and subsequently underwent angiography with coil embolization. Hemoglobin 14.3 on arrival, now down to 10.2g/dl.   Assessment and Plan: Diverticular bleed in ascending colon: now s/p coil embolization by IR Dr. Earleen Newport 10/1. Previous colonoscopy showed diverticulosis, suspected cause of painless hematochezia despite hx IBD.  - Monitor H/H as below  - ADAT - Do not submerge R CFA access site for 7 days. No longer requires any bedrest.   ABLA due to GI bleed:  - Hgb continues downward trend/equilibration as anticipated. Per IR, will anticipate some element of passing old blood for 24-48 hours. Will trend H/H until stable with transfusion threshold 7g/dl. Pt consents to transfusion under that scenario or if destabilizing bleeding occurs.  - Continue holding ASA    Sinus bradycardia, 1st degree AVB: Appears to be normalizing while holding beta blocker. TSH 0.811.  - Will restart home medications as HR rising > 100 with BP elevated.   Hypokalemia: Resolved.   Crohn's disease: No evidence of flare at this time.  - Suggest she reengage with GI as outpatient, hasn't been seen since 2018.  Has not seen GI since 2018 during last flair, she has had no further flairs since that time  No evidence of inflammation CT GI consulted   T2DM: Well-controlled with HbA1c 6.3%.  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  - Continue norvasc - Will hold lasix and ACEi since she just got a significant contrast dye load.   HLD:  - Continue statin  Subjective: No BM this AM, no chest pain or dyspnea or palpitations.   Objective: Vitals:   02/26/22 1145 02/26/22 1200 02/26/22 1354 02/26/22 1558  BP: (!) 170/67 (!) 183/81 (!) 171/75 (!) 163/74  Pulse: 76 66 92 96  Resp: '19 18 20 17  '$ Temp:  98 F (36.7 C) 98.7 F (37.1 C) 99 F (37.2 C)  TempSrc:  Oral Oral Oral  SpO2: 99% 94% 100% 97%   Gen: 87 y.o. female in no distress Pulm: Nonlabored breathing room air. Clear CV: Regular rate and rhythm. No murmur, rub, or gallop. No JVD, 2+ LE edema. GI: Abdomen soft, non-tender, non-distended, with normoactive bowel sounds.  Ext: Warm, no deformities Skin: Right femoral access dressing is c/d/I with no other rashes, lesions or ulcers on visualized skin. Neuro: Alert and oriented. No focal neurological deficits. Psych: Judgement and insight appear fair. Mood euthymic & affect congruent. Behavior is appropriate.    Data Personally reviewed: CBC: Recent Labs  Lab 02/25/22 0654 02/25/22 1425 02/25/22 1940 02/26/22 0230 02/26/22 0825  WBC 5.7 9.7 10.3 10.7* 11.4*  NEUTROABS  --   --   --   --  9.2*  HGB 14.3 12.1 11.3* 10.9* 10.2*  HCT 43.9 37.6 34.9* 32.1* 30.6*  MCV 90.9 92.8 93.6 92.5 93.3  PLT 242 222 189 209 341   Basic Metabolic  Panel: Recent Labs  Lab 02/25/22 0654 02/25/22 1425 02/26/22 0230  NA 140  --  142  K 3.3*  --  3.6  CL 109  --  108  CO2 23  --  24  GLUCOSE 133*  --  158*  BUN 16  --  12  CREATININE 1.09*  --  1.00  CALCIUM 9.2  --  8.8*  MG  --  1.5*  --    GFR: Estimated Creatinine Clearance: 46.2 mL/min (by C-G formula based on SCr of 1 mg/dL). Liver Function Tests: Recent Labs  Lab 02/25/22 0654  AST 22  ALT 20  ALKPHOS 67  BILITOT 0.6  PROT 7.2  ALBUMIN 3.9   No  results for input(s): "LIPASE", "AMYLASE" in the last 168 hours. No results for input(s): "AMMONIA" in the last 168 hours. Coagulation Profile: Recent Labs  Lab 02/25/22 0654  INR 1.2   Cardiac Enzymes: No results for input(s): "CKTOTAL", "CKMB", "CKMBINDEX", "TROPONINI" in the last 168 hours. BNP (last 3 results) No results for input(s): "PROBNP" in the last 8760 hours. HbA1C: Recent Labs    02/25/22 1326  HGBA1C 6.3*   CBG: Recent Labs  Lab 02/25/22 1935 02/26/22 0221 02/26/22 0756 02/26/22 1234 02/26/22 1600  GLUCAP 146* 160* 165* 155* 143*   Lipid Profile: No results for input(s): "CHOL", "HDL", "LDLCALC", "TRIG", "CHOLHDL", "LDLDIRECT" in the last 72 hours. Thyroid Function Tests: Recent Labs    02/25/22 1940  TSH 0.811   Anemia Panel: No results for input(s): "VITAMINB12", "FOLATE", "FERRITIN", "TIBC", "IRON", "RETICCTPCT" in the last 72 hours. Urine analysis:    Component Value Date/Time   COLORURINE STRAW (A) 01/23/2015 0802   APPEARANCEUR CLEAR (A) 01/23/2015 0802   APPEARANCEUR Cloudy 10/27/2011 1403   LABSPEC 1.006 01/23/2015 0802   LABSPEC 1.017 10/27/2011 1403   PHURINE 6.0 01/23/2015 0802   GLUCOSEU NEGATIVE 01/23/2015 0802   GLUCOSEU Negative 10/27/2011 1403   HGBUR NEGATIVE 01/23/2015 0802   BILIRUBINUR NEGATIVE 01/23/2015 0802   BILIRUBINUR Negative 10/27/2011 1403   KETONESUR NEGATIVE 01/23/2015 0802   PROTEINUR NEGATIVE 01/23/2015 0802   NITRITE NEGATIVE 01/23/2015 0802   LEUKOCYTESUR TRACE (A) 01/23/2015 0802   LEUKOCYTESUR 3+ 10/27/2011 1403   No results found for this or any previous visit (from the past 240 hour(s)).   ECHOCARDIOGRAM COMPLETE  Result Date: 02/26/2022    ECHOCARDIOGRAM REPORT   Patient Name:   AVONELL LENIG Date of Exam: 02/26/2022 Medical Rec #:  [id number]      Height:       67.0 in Accession #:    [id number]     Weight:       189.0 lb Date of Birth:  Feb 22, 1935      BSA:          1.974 m Patient Age:    50 years        BP:           134/57 mmHg Patient Gender: F              HR:           72 bpm. Exam Location:  Inpatient Procedure: 2D Echo Indications:    bradycardia  History:        Patient has no prior history of Echocardiogram examinations.                 Arrythmias:Bradycardia; Risk Factors:Hypertension, Diabetes and  Dyslipidemia.  Sonographer:    Harvie Junior Referring Phys: [id number] [facility]  Sonographer Comments: Technically difficult study due to poor echo windows and patient is obese. Image acquisition challenging due to patient body habitus and supine. IMPRESSIONS  1. Left ventricular ejection fraction, by estimation, is 60 to 65%. The left ventricle has normal function. The left ventricle has no regional wall motion abnormalities. There is mild left ventricular hypertrophy. Left ventricular diastolic parameters are consistent with Grade I diastolic dysfunction (impaired relaxation).  2. Right ventricular systolic function is normal. The right ventricular size is normal. There is normal pulmonary artery systolic pressure. The estimated right ventricular systolic pressure is 94.8 mmHg.  3. The mitral valve is degenerative. No evidence of mitral valve regurgitation. Moderate mitral annular calcification.  4. The aortic valve was not well visualized. Aortic valve regurgitation is not visualized. No aortic stenosis is present. FINDINGS  Left Ventricle: Left ventricular ejection fraction, by estimation, is 60 to 65%. The left ventricle has normal function. The left ventricle has no regional wall motion abnormalities. The left ventricular internal cavity size was normal in size. There is  mild left ventricular hypertrophy. Left ventricular diastolic parameters are consistent with Grade I diastolic dysfunction (impaired relaxation). Right Ventricle: The right ventricular size is normal. No increase in right ventricular wall thickness. Right ventricular systolic function is normal. There is normal  pulmonary artery systolic pressure. The tricuspid regurgitant velocity is 2.41 m/s, and  with an assumed right atrial pressure of 3 mmHg, the estimated right ventricular systolic pressure is 01.6 mmHg. Left Atrium: Left atrial size was normal in size. Right Atrium: Right atrial size was normal in size. Pericardium: There is no evidence of pericardial effusion. Mitral Valve: The mitral valve is degenerative in appearance. Moderate mitral annular calcification. No evidence of mitral valve regurgitation. Tricuspid Valve: The tricuspid valve is normal in structure. Tricuspid valve regurgitation is trivial. Aortic Valve: The aortic valve was not well visualized. Aortic valve regurgitation is not visualized. No aortic stenosis is present. Aortic valve mean gradient measures 4.0 mmHg. Aortic valve peak gradient measures 7.6 mmHg. Aortic valve area, by VTI measures 2.10 cm. Pulmonic Valve: The pulmonic valve was not well visualized. Pulmonic valve regurgitation is not visualized. Aorta: The aortic root is normal in size and structure. IAS/Shunts: The interatrial septum was not well visualized.  LEFT VENTRICLE PLAX 2D LVIDd:         4.60 cm     Diastology LVIDs:         3.40 cm     LV e' medial:    5.77 cm/s LV PW:         1.20 cm     LV E/e' medial:  15.6 LV IVS:        1.10 cm     LV e' lateral:   8.81 cm/s LVOT diam:     1.70 cm     LV E/e' lateral: 10.2 LV SV:         56 LV SV Index:   28 LVOT Area:     2.27 cm  LV Volumes (MOD) LV vol d, MOD A2C: 66.7 ml LV vol d, MOD A4C: 83.5 ml LV vol s, MOD A2C: 25.3 ml LV vol s, MOD A4C: 26.7 ml LV SV MOD A2C:     41.4 ml LV SV MOD A4C:     83.5 ml LV SV MOD BP:      49.7 ml RIGHT VENTRICLE RV Basal diam:  3.50 cm  RV Mid diam:    3.20 cm RV S prime:     22.80 cm/s TAPSE (M-mode): 3.0 cm LEFT ATRIUM             Index        RIGHT ATRIUM           Index LA diam:        3.80 cm 1.92 cm/m   RA Area:     12.30 cm LA Vol (A2C):   40.1 ml 20.31 ml/m  RA Volume:   26.30 ml  13.32  ml/m LA Vol (A4C):   37.1 ml 18.79 ml/m LA Biplane Vol: 40.6 ml 20.57 ml/m  AORTIC VALVE                     PULMONIC VALVE AV Area (Vmax):    2.01 cm      PV Vmax:       1.76 m/s AV Area (Vmean):   1.67 cm      PV Peak grad:  12.4 mmHg AV Area (VTI):     2.10 cm AV Vmax:           138.00 cm/s AV Vmean:          102.000 cm/s AV VTI:            0.265 m AV Peak Grad:      7.6 mmHg AV Mean Grad:      4.0 mmHg LVOT Vmax:         122.00 cm/s LVOT Vmean:        75.000 cm/s LVOT VTI:          0.245 m LVOT/AV VTI ratio: 0.92  AORTA Ao Root diam: 3.10 cm MITRAL VALVE                TRICUSPID VALVE MV Area (PHT): 2.99 cm     TR Peak grad:   23.2 mmHg MV Decel Time: 254 msec     TR Vmax:        241.00 cm/s MV E velocity: 90.00 cm/s MV A velocity: 112.00 cm/s  SHUNTS MV E/A ratio:  0.80         Systemic VTI:  0.24 m                             Systemic Diam: 1.70 cm Oswaldo Milian MD Electronically signed by Oswaldo Milian MD Signature Date/Time: 02/26/2022/11:04:31 AM    Final    CT Angio Abd/Pel W and/or Wo Contrast  Result Date: 02/25/2022 CLINICAL DATA:  Bright red blood per rectum. History of gastrointestinal hemorrhage. EXAM: CTA ABDOMEN AND PELVIS WITHOUT AND WITH CONTRAST TECHNIQUE: Multidetector CT imaging of the abdomen and pelvis was performed using the standard protocol during bolus administration of intravenous contrast. Multiplanar reconstructed images and MIPs were obtained and reviewed to evaluate the vascular anatomy. RADIATION DOSE REDUCTION: This exam was performed according to the departmental dose-optimization program which includes automated exposure control, adjustment of the mA and/or kV according to patient size and/or use of iterative reconstruction technique. CONTRAST:  124m OMNIPAQUE IOHEXOL 350 MG/ML SOLN COMPARISON:  CT 01/23/2015 FINDINGS: VASCULAR Aorta: Normal caliber aorta without aneurysm, dissection, vasculitis or significant stenosis. Mild intimal calcification aorta  is branches Celiac: Patent without evidence of aneurysm, dissection, vasculitis or significant stenosis. SMA: Patent without evidence of aneurysm, dissection, vasculitis or significant stenosis. Renals: Both renal arteries are patent without evidence of  aneurysm, dissection, vasculitis, fibromuscular dysplasia or significant stenosis. Small accessory renal arteries on LEFT and RIGHT. IMA: Patent without evidence of aneurysm, dissection, vasculitis or significant stenosis. Inflow: Patent without evidence of aneurysm, dissection, vasculitis or significant stenosis. Proximal Outflow: Bilateral common femoral and visualized portions of the superficial and profunda femoral arteries are patent without evidence of aneurysm, dissection, vasculitis or significant stenosis. Veins: No obvious venous abnormality within the limitations of this arterial phase study. Review of the MIP images confirms the above findings. NON-VASCULAR Lower chest: Lung bases are clear. Hepatobiliary: No focal hepatic lesion. No biliary duct dilatation. Common bile duct is normal. Pancreas: Pancreas is normal. No ductal dilatation. No pancreatic inflammation. Spleen: Normal spleen Adrenals/urinary tract: Adrenal glands and kidneys are normal. The ureters and bladder normal. Stomach/Bowel: No evidence of active gastrointestinal bleeding within the stomach, duodenum or small bowel. The terminal ileum is normal. Within the midportion of the ascending colon there is a focus of high-density contrast within the dependent lumen of the ascending colon (image 108/series 9) which is not present on the noncontrast CT series. On the more delayed venous phase imaging, this intraluminal contrast increases and pools dependently and spreads antegrade. No evidence of additional active hemorrhage within the LEFT colon or rectosigmoid colon. Multiple diverticula through the colon. Vascular/Lymphatic: No periportal or retroperitoneal adenopathy. No pelvic adenopathy.  Reproductive: Post hysterectomy.  Adnexa unremarkable Other: No free fluid. Musculoskeletal: No aggressive osseous lesion. IMPRESSION: VASCULAR 1. Active gastrointestinal bleeding in the ascending colon. 2.  Aortic Atherosclerosis (ICD10-I70.0). NON-VASCULAR 1. Mild pan diverticulosis.  No evidence acute diverticulitis. Findings conveyed toSILAS WONG on 02/25/2022  at09:28. Electronically Signed   By: Suzy Bouchard M.D.   On: 02/25/2022 09:29    Family Communication: None at bedside  Disposition: Status is: Inpatient Remains inpatient appropriate because: Continued monitoring for blood loss anemia as expected postprocedurally Planned Discharge Destination: Home  Patrecia Pour, MD 02/26/2022 4:13 PM Page by [email or web address]

## 2022-02-26 NOTE — Progress Notes (Signed)
  Echocardiogram 2D Echocardiogram has been performed.  Kaitlin Gomez 02/26/2022, 10:20 AM

## 2022-02-26 NOTE — Progress Notes (Signed)
  Transition of Care [facility]) Screening Note   Patient Details  Name: Kaitlin Gomez Date of Birth: 1934-06-09   Transition of Care [facility]) CM/SW Contact:    Cyndi Bender, RN Phone Number: 02/26/2022, 4:31 PM    Transition of Care Department [facility]) has reviewed patient and no TOC needs have been identified at this time. We will continue to monitor patient advancement through interdisciplinary progression rounds. If new patient transition needs arise, please place a TOC consult.

## 2022-02-26 NOTE — ED Notes (Signed)
Femoral insertion site is clean, dry, and intact.

## 2022-02-27 DIAGNOSIS — K5090 Crohn's disease, unspecified, without complications: Secondary | ICD-10-CM | POA: Diagnosis not present

## 2022-02-27 DIAGNOSIS — E876 Hypokalemia: Secondary | ICD-10-CM | POA: Diagnosis not present

## 2022-02-27 DIAGNOSIS — R001 Bradycardia, unspecified: Secondary | ICD-10-CM | POA: Diagnosis not present

## 2022-02-27 DIAGNOSIS — K922 Gastrointestinal hemorrhage, unspecified: Secondary | ICD-10-CM | POA: Diagnosis not present

## 2022-02-27 LAB — BASIC METABOLIC PANEL
Anion gap: 7 (ref 5–15)
BUN: 13 mg/dL (ref 8–23)
CO2: 27 mmol/L (ref 22–32)
Calcium: 8.6 mg/dL — ABNORMAL LOW (ref 8.9–10.3)
Chloride: 107 mmol/L (ref 98–111)
Creatinine, Ser: 0.91 mg/dL (ref 0.44–1.00)
GFR, Estimated: >60 mL/min (ref >60)
Glucose, Bld: 117 mg/dL — ABNORMAL HIGH (ref 70–99)
Potassium: 3.5 mmol/L (ref 3.5–5.1)
Sodium: 141 mmol/L (ref 135–145)

## 2022-02-27 LAB — GLUCOSE, CAPILLARY
Glucose-Capillary: 159 mg/dL — ABNORMAL HIGH (ref 70–99)
Glucose-Capillary: 143 mg/dL — ABNORMAL HIGH (ref 70–99)
Glucose-Capillary: 110 mg/dL — ABNORMAL HIGH (ref 70–99)
Glucose-Capillary: 133 mg/dL — ABNORMAL HIGH (ref 70–99)

## 2022-02-27 LAB — CBC
HCT: 25.5 % — ABNORMAL LOW (ref 36.0–46.0)
Hemoglobin: 8.3 g/dL — ABNORMAL LOW (ref 12.0–15.0)
MCH: 30.4 pg (ref 26.0–34.0)
MCHC: 32.5 g/dL (ref 30.0–36.0)
MCV: 93.4 fL (ref 80.0–100.0)
Platelets: 174 10*3/uL (ref 150–400)
RBC: 2.73 MIL/uL — ABNORMAL LOW (ref 3.87–5.11)
RDW: 14 % (ref 11.5–15.5)
WBC: 9 10*3/uL (ref 4.0–10.5)
nRBC: 0 % (ref 0.0–0.2)

## 2022-02-27 MED ORDER — LISINOPRIL 20 MG PO TABS
40.0000 mg | ORAL_TABLET | Freq: Every day | ORAL | Status: DC
  Administered 2022-02-27 – 2022-03-05 (×6): 40 mg via ORAL
  Filled 2022-02-27 (×7): qty 2

## 2022-02-27 MED ORDER — QUINAPRIL HCL 10 MG PO TABS
40.0000 mg | ORAL_TABLET | Freq: Two times a day (BID) | ORAL | Status: DC
  Filled 2022-02-27 (×2): qty 4

## 2022-02-27 MED ORDER — FUROSEMIDE 40 MG PO TABS
40.0000 mg | ORAL_TABLET | Freq: Every day | ORAL | Status: DC
  Administered 2022-02-27 – 2022-03-07 (×8): 40 mg via ORAL
  Filled 2022-02-27 (×9): qty 1

## 2022-02-27 NOTE — Evaluation (Signed)
Occupational Therapy Evaluation Patient Details Name: Kaitlin Gomez MRN: [id number] DOB: 07/18/1934 Today's Date: 02/27/2022   History of Present Illness 87 y/o female admitted as a tranfer from [facility] to ED 10/1 for work up in IR for active bleed in her ascending colon.  S/p US guided cmbolization of the vasa recta supplying pt's diverticular bleed right colon on 10/1.  PMHx: DM2, HLD, HTN, chron's ds s/p sigmoid colectormy.   Clinical Impression   PTA, pt was living with her son and was independent; enjoys singing in her church's choir. Pt currently requiring Min Guard A for ADLs and functional mobility with RW. Pt highly motivated and performing functional mobility in hallway. However, pt demonstrating decreased activity tolerance compared to her baseline.  VSS on RA. Pt would benefit from further acute OT to facilitate safe dc. Recommend dc to home once medically stable per physician and agree that pt would benefit from HHPT for balance and strength.       Recommendations for follow up therapy are one component of a multi-disciplinary discharge planning process, led by the attending physician.  Recommendations may be updated based on patient status, additional functional criteria and insurance authorization.   Follow Up Recommendations  No OT follow up    Assistance Recommended at Discharge Intermittent Supervision/Assistance  Patient can return home with the following Assistance with cooking/housework    Functional Status Assessment  Patient has had a recent decline in their functional status and demonstrates the ability to make significant improvements in function in a reasonable and predictable amount of time.  Equipment Recommendations  Other (comment) (Discussed benefits of shower chair, pt declining at this time)    Recommendations for Other Services       Precautions / Restrictions Precautions Precautions: Fall Restrictions Weight Bearing Restrictions: No      Mobility  Bed Mobility Overal bed mobility: Needs Assistance Bed Mobility: Supine to Sit, Sit to Supine     Supine to sit: Min guard Sit to supine: Min assist   General bed mobility comments: Min Guard A for safety out of bed. Mi nA for elevaitng RLE over EOB    Transfers Overall transfer level: Needs assistance Equipment used: Rolling walker (2 wheels) Transfers: Sit to/from Stand Sit to Stand: Min guard           General transfer comment: Min Guard A for safety      Balance Overall balance assessment: Needs assistance Sitting-balance support: No upper extremity supported, Feet supported Sitting balance-Leahy Scale: Fair     Standing balance support: Bilateral upper extremity supported, No upper extremity supported, During functional activity Standing balance-Leahy Scale: Fair                             ADL either performed or assessed with clinical judgement   ADL Overall ADL's : Needs assistance/impaired Eating/Feeding: Set up;Sitting   Grooming: Min guard;Standing   Upper Body Bathing: Supervision/ safety;Set up;Sitting   Lower Body Bathing: Min guard;Sit to/from stand   Upper Body Dressing : Supervision/safety;Set up;Sitting   Lower Body Dressing: Min guard;Sit to/from stand   Toilet Transfer: Min guard;Ambulation;Rolling walker (2 wheels)           Functional mobility during ADLs: Min guard;Rolling walker (2 wheels) General ADL Comments: Pt presenting with decreased acitivty tolerance compared to her normal. Completing functional mobility in hallway with Rw.     Vision Baseline Vision/History: 1 Wears glasses  Perception     Praxis      Pertinent Vitals/Pain Pain Assessment Pain Assessment: No/denies pain     Hand Dominance     Extremity/Trunk Assessment Upper Extremity Assessment Upper Extremity Assessment: Overall WFL for tasks assessed   Lower Extremity Assessment Lower Extremity Assessment: Generalized weakness    Cervical / Trunk Assessment Cervical / Trunk Assessment: Normal   Communication Communication Communication: No difficulties   Cognition Arousal/Alertness: Awake/alert Behavior During Therapy: WFL for tasks assessed/performed Overall Cognitive Status: Within Functional Limits for tasks assessed                                       General Comments  HR 80-90s    Exercises     Shoulder Instructions      Home Living Family/patient expects to be discharged to:: Private residence Living Arrangements: Children (Son) Available Help at Discharge: Family;Available 24 hours/day Type of Home: House Home Access: Level entry     Home Layout: One level     Bathroom Shower/Tub: Teacher, early years/pre: Standard     Home Equipment: Cane - single point          Prior Functioning/Environment Prior Level of Function : Independent/Modified Independent;Driving             Mobility Comments: using cane as needed ADLs Comments: ADLs, IADLs, and driving. Enjoys participating in her church's choir.        OT Problem List: Decreased strength;Decreased range of motion;Decreased activity tolerance;Impaired balance (sitting and/or standing);Decreased knowledge of use of DME or AE;Decreased knowledge of precautions      OT Treatment/Interventions: Self-care/ADL training;Therapeutic exercise;DME and/or AE instruction;Energy conservation;Therapeutic activities;Patient/family education    OT Goals(Current goals can be found in the care plan section) Acute Rehab OT Goals Patient Stated Goal: Go home OT Goal Formulation: With patient Time For Goal Achievement: 03/13/22 Potential to Achieve Goals: Good  OT Frequency: Min 3X/week    Co-evaluation              AM-PAC OT "6 Clicks" Daily Activity     Outcome Measure Help from another person eating meals?: A Little Help from another person taking care of personal grooming?: A Little Help from another  person toileting, which includes using toliet, bedpan, or urinal?: A Little Help from another person bathing (including washing, rinsing, drying)?: A Little Help from another person to put on and taking off regular upper body clothing?: A Little Help from another person to put on and taking off regular lower body clothing?: A Little 6 Click Score: 18   End of Session Equipment Utilized During Treatment: Rolling walker (2 wheels) Nurse Communication: Mobility status  Activity Tolerance: Patient tolerated treatment well Patient left: in bed;with call bell/phone within reach;with SCD's reapplied  OT Visit Diagnosis: Unsteadiness on feet (R26.81);Other abnormalities of gait and mobility (R26.89);Muscle weakness (generalized) (M62.81)                Time: 3086-5784 OT Time Calculation (min): 22 min Charges:  OT General Charges $OT Visit: 1 Visit OT Evaluation $OT Eval Low Complexity: 1 Low  Pradyun Ishman MSOT, OTR/L Acute Rehab Office: [place] 02/27/2022, 2:16 PM

## 2022-02-27 NOTE — TOC Initial Note (Signed)
Transition of Care [facility]) - Initial/Assessment Note    Patient Details  Name: Kaitlin Gomez MRN: [id number] Date of Birth: [birth year]  [facility] CM/SW Contact:    Cyndi Bender, RN Phone Number: 02/27/2022, 9:09 AM  Clinical Narrative:                 Spoke to patient regarding transition needs. Patient states her son lives with her. Patient doesn't want home health care. States she moves around a lot at home.  Patient has a walker, cane.  Pcp verified. Patient has transportation home at discharge.  No other TOC needs.  Expected Discharge Plan: [place] Barriers to Discharge: Continued Medical Work up   Patient Goals and CMS Choice Patient states their goals for this hospitalization and ongoing recovery are:: return home      Expected Discharge Plan and Services Expected Discharge Plan: [place]   Discharge Planning Services: CM Consult   Living arrangements for the past 2 months: Single Family Home                                      Prior Living Arrangements/Services Living arrangements for the past 2 months: Single Family Home Lives with:: Adult Children Patient language and need for interpreter reviewed:: Yes Do you feel safe going back to the place where you live?: Yes      Need for Family Participation in Patient Care: Yes (Comment) Care giver support system in place?: Yes (comment) Current home services: DME Criminal Activity/Legal Involvement Pertinent to Current Situation/Hospitalization: No - Comment as needed  Activities of Daily Living Home Assistive Devices/Equipment: Cane (specify quad or straight) ADL Screening (condition at time of admission) Patient's cognitive ability adequate to safely complete daily activities?: Yes Is the patient deaf or have difficulty hearing?: No Does the patient have difficulty seeing, even when wearing glasses/contacts?: No Does the patient have difficulty  concentrating, remembering, or making decisions?: No Patient able to express need for assistance with ADLs?: Yes Does the patient have difficulty dressing or bathing?: No Independently performs ADLs?: Yes (appropriate for developmental age) Does the patient have difficulty walking or climbing stairs?: No Weakness of Legs: None Weakness of Arms/Hands: None  Permission Sought/Granted                  Emotional Assessment   Attitude/Demeanor/Rapport: Engaged Affect (typically observed): Accepting Orientation: : Oriented to Situation, Oriented to  Time, Oriented to Place, Oriented to Self Alcohol / Substance Use: Not Applicable Psych Involvement: No (comment)  Admission diagnosis:  Lower GI bleed [K92.2] Patient Active Problem List   Diagnosis Date Noted   Hypokalemia 02/25/2022   Sinus bradycardia 02/25/2022   Hyperlipidemia 02/25/2022   BRBPR (bright red blood per rectum) 10/30/2016   Diabetes ([place]) 10/30/2016   HTN (hypertension) 10/30/2016   Crohn's disease ([place]) 10/30/2016   Lower GI bleed 10/30/2016   Colitis 12/31/2014   PCP:  Marguerita Merles, MD Pharmacy:   [facility] [address] [place] [address] [place]) [place] [address] Phone: [phone] Fax: [phone]     Social Determinants of Health (SDOH) Interventions    Readmission Risk Interventions     No data to display

## 2022-02-27 NOTE — Plan of Care (Signed)
  Problem: Education: Goal: Ability to describe self-care measures that may prevent or decrease complications (Diabetes Survival Skills Education) will improve Outcome: Progressing Goal: Individualized Educational Video(s) Outcome: Progressing   

## 2022-02-27 NOTE — Progress Notes (Signed)
Progress Note  Patient: Kaitlin Gomez:[id number] DOB: 1934-08-27  DOA: 02/25/2022  DOS: 02/27/2022    Brief hospital course: Kaitlin Gomez is an 87 y.o. female with a history of Crohn's disease s/p sigmoid colectomy, colovesical fistula repair 1995 at [facility], T2DM, HTN, HLD who presented to [facility] with several episodes of hematochezia found to have active bleeding in the ascending colon by CT angiography, transferred to [facility] for IR evaluation and subsequently underwent angiography with coil embolization. Hemoglobin 14.3 on arrival, now down to 8.3g/dl.  Assessment and Plan: Diverticular bleed in ascending colon: now s/p coil embolization by IR Dr. Earleen Newport 10/1. Previous colonoscopy showed diverticulosis, suspected cause of painless hematochezia despite hx IBD.  - Monitor H/H as below  - ADAT - Do not submerge R CFA access site for 7 days. No longer requires any bedrest.   ABLA due to GI bleed:  - Hgb continues downward trend/equilibration as anticipated. Per IR, will anticipate some element of passing old blood for 24-48 hours. Will trend H/H until stable with transfusion threshold 7g/dl. Pt consents to transfusion under that scenario or if destabilizing bleeding occurs.  - Continue holding ASA    Sinus bradycardia, 1st degree AVB: Improved when beta blocker held. TSH 0.811. Limited echo showed preserved LVEF and no wall motion abnormalities.  - To help with BP control, home atenolol restarted, modest bradycardia which is asymptomatic is noted.   Hypokalemia: Resolved.   Crohn's disease: No evidence of flare at this time.  - Suggest she reengage with GI as outpatient, hasn't been seen since 2018.   T2DM: Well-controlled with HbA1c 6.3%.  - Hold metformin with recent contrast administration (bicarb wnl at 24) - Sensitive SSI  Resistant HTN:  - Continue home medications.  - Restart home dose clonidine.  - Continue norvasc - Restart lasix, ACEi as Cr is stable, wnl, legs are edematous, and  BP still elevated  HLD:  - Continue statin  Subjective: Having hematochezia with BM's but not frequently. No abd pain or other issues. Denies chest pain or dyspnea with exertion.  Objective: Vitals:   02/27/22 0045 02/27/22 0520 02/27/22 0819 02/27/22 1212  BP:  (!) 154/60 (!) 149/53 (!) 146/65  Pulse:  82 62 (!) 58  Resp:  '18 18 17  '$ Temp: 98.7 F (37.1 C) 98.9 F (37.2 C) 98 F (36.7 C)   TempSrc:  Oral    SpO2:  97% 100% 99%   Gen: 87 y.o. female in no distress Pulm: Nonlabored breathing room air. Clear. CV: Regular rate and rhythm. No murmur, rub, or gallop. No JVD, 1+ dependent edema. GI: Abdomen soft, non-tender, non-distended, with normoactive bowel sounds.  Ext: Warm, no deformities Skin: No rashes, lesions or ulcers on visualized skin. Neuro: Alert and oriented. No focal neurological deficits. Psych: Judgement and insight appear fair. Mood euthymic & affect congruent. Behavior is appropriate.    Data Personally reviewed: CBC: Recent Labs  Lab 02/25/22 1425 02/25/22 1940 02/26/22 0230 02/26/22 0825 02/27/22 0426  WBC 9.7 10.3 10.7* 11.4* 9.0  NEUTROABS  --   --   --  9.2*  --   HGB 12.1 11.3* 10.9* 10.2* 8.3*  HCT 37.6 34.9* 32.1* 30.6* 25.5*  MCV 92.8 93.6 92.5 93.3 93.4  PLT 222 189 209 214 767   Basic Metabolic Panel: Recent Labs  Lab 02/25/22 0654 02/25/22 1425 02/26/22 0230 02/27/22 0426  NA 140  --  142 141  K 3.3*  --  3.6 3.5  CL 109  --  108 107  CO2 23  --  24 27  GLUCOSE 133*  --  158* 117*  BUN 16  --  12 13  CREATININE 1.09*  --  1.00 0.91  CALCIUM 9.2  --  8.8* 8.6*  MG  --  1.5*  --   --    GFR: Estimated Creatinine Clearance: 50.8 mL/min (by C-G formula based on SCr of 0.91 mg/dL). Liver Function Tests: Recent Labs  Lab 02/25/22 0654  AST 22  ALT 20  ALKPHOS 67  BILITOT 0.6  PROT 7.2  ALBUMIN 3.9   No results for input(s): "LIPASE", "AMYLASE" in the last 168 hours. No results for input(s): "AMMONIA" in the last 168  hours. Coagulation Profile: Recent Labs  Lab 02/25/22 0654  INR 1.2   Cardiac Enzymes: No results for input(s): "CKTOTAL", "CKMB", "CKMBINDEX", "TROPONINI" in the last 168 hours. BNP (last 3 results) No results for input(s): "PROBNP" in the last 8760 hours. HbA1C: Recent Labs    02/25/22 1326  HGBA1C 6.3*   CBG: Recent Labs  Lab 02/26/22 1234 02/26/22 1600 02/26/22 2159 02/27/22 0818 02/27/22 1212  GLUCAP 155* 143* 111* 159* 143*   Lipid Profile: No results for input(s): "CHOL", "HDL", "LDLCALC", "TRIG", "CHOLHDL", "LDLDIRECT" in the last 72 hours. Thyroid Function Tests: Recent Labs    02/25/22 1940  TSH 0.811   Anemia Panel: No results for input(s): "VITAMINB12", "FOLATE", "FERRITIN", "TIBC", "IRON", "RETICCTPCT" in the last 72 hours. Urine analysis:    Component Value Date/Time   COLORURINE STRAW (A) 01/23/2015 0802   APPEARANCEUR CLEAR (A) 01/23/2015 0802   APPEARANCEUR Cloudy 10/27/2011 1403   LABSPEC 1.006 01/23/2015 0802   LABSPEC 1.017 10/27/2011 1403   PHURINE 6.0 01/23/2015 0802   GLUCOSEU NEGATIVE 01/23/2015 0802   GLUCOSEU Negative 10/27/2011 1403   HGBUR NEGATIVE 01/23/2015 0802   BILIRUBINUR NEGATIVE 01/23/2015 0802   BILIRUBINUR Negative 10/27/2011 1403   KETONESUR NEGATIVE 01/23/2015 0802   PROTEINUR NEGATIVE 01/23/2015 0802   NITRITE NEGATIVE 01/23/2015 0802   LEUKOCYTESUR TRACE (A) 01/23/2015 0802   LEUKOCYTESUR 3+ 10/27/2011 1403   No results found for this or any previous visit (from the past 240 hour(s)).   IR EMBO ART  VEN HEMORR LYMPH EXTRAV  INC GUIDE ROADMAPPING  Result Date: 02/27/2022 INDICATION: 87 year old female with acute right-sided GI hemorrhage EXAM: ULTRASOUND-GUIDED ACCESS RIGHT COMMON FEMORAL ARTERY MESENTERIC ANGIOGRAM SUPER SELECTIVE CATHETER POSITION TO THE VASA RECTA OF THE CECUM EMBOLIZATION OF THE VASA RECTA FOR EMERGENT GI HEMORRHAGE CELT FOR HEMOSTASIS MEDICATIONS: None ANESTHESIA/SEDATION: Moderate (conscious)  sedation was employed during this procedure. A total of Versed 1.0 mg and Fentanyl 50 mcg was administered intravenously by the radiology nurse. Total intra-service moderate Sedation Time: 39 minutes. The patient's level of consciousness and vital signs were monitored continuously by radiology nursing throughout the procedure under my direct supervision. CONTRAST:  50 cc FLUOROSCOPY: Radiation Exposure Index (as provided by the fluoroscopic device): 9 minutes, 829 mGy Kerma COMPLICATIONS: None PROCEDURE: Informed consent was obtained from the patient following explanation of the procedure, risks, benefits and alternatives. The patient understands, agrees and consents for the procedure. All questions were addressed. A time out was performed prior to the initiation of the procedure. Maximal barrier sterile technique utilized including caps, mask, sterile gowns, sterile gloves, large sterile drape, hand hygiene, and Betadine prep. Ultrasound survey of the right inguinal region was performed with images stored and sent to PACs, confirming patency of the vessel. A micropuncture needle was used access the right  common femoral artery under ultrasound. With excellent arterial blood flow returned, and an .018 micro wire was passed through the needle, observed enter the abdominal aorta under fluoroscopy. The needle was removed, and a micropuncture sheath was placed over the wire. The inner dilator and wire were removed, and an 035 Bentson wire was advanced under fluoroscopy into the abdominal aorta. The sheath was removed and a standard 5 [place] vascular sheath was placed. The dilator was removed and the sheath was flushed. C2 cobra catheter was advanced into the abdominal aorta and used to select the SMA. Angiogram was performed. The catheter was un stable at the origin of the SMA, and attempt at advancing distally with the Glidewire was unsuccessful. The catheter was exchanged for a Mickelson catheter. Tip of the Mickelson  catheter were used to select the SMA origin. With a stable catheter position, a microcatheter coaxial system was advanced within 025 penumbra lantern with 014 fathom wire. Catheter was placed into the right colic artery. Angiogram was performed. Catheter was then advanced into the descending branch of the ileocolic artery. Angiogram was performed. Microcatheter was addended advanced into the marginal artery, that were supplying the Vasa recta in the region of interest. The catheter would not advance into the specific Vasa recta of interest, and as such the origin of the Vasa recta near branch point of the marginal artery were coil embolized with a single 2 mm coil and a single 1 mm coil. Catheter was withdrawn and angiogram was performed. At the conclusion, there was no further hemorrhage identified. All catheters wires were withdrawn. A Celt was deployed for hemostasis. Patient tolerated the procedure well and remained hemodynamically stable throughout. No complications were encountered and no significant blood loss. 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 Celt deployed for hemostasis. Signed, Dulcy Fanny. Nadene Rubins, RPVI [facility] Radiology Electronically Signed   By: Corrie Mckusick D.O.   On: 02/27/2022 08:48   IR US Guide Vasc Access Right  Result Date: 02/27/2022 INDICATION: 87 year old female with acute right-sided GI hemorrhage EXAM: ULTRASOUND-GUIDED ACCESS RIGHT COMMON FEMORAL ARTERY MESENTERIC ANGIOGRAM SUPER SELECTIVE CATHETER POSITION TO THE VASA RECTA OF THE CECUM EMBOLIZATION OF THE VASA RECTA FOR EMERGENT GI HEMORRHAGE CELT FOR HEMOSTASIS MEDICATIONS: None ANESTHESIA/SEDATION: Moderate  (conscious) sedation was employed during this procedure. A total of Versed 1.0 mg and Fentanyl 50 mcg was administered intravenously by the radiology nurse. Total intra-service moderate Sedation Time: 39 minutes. The patient's level of consciousness and vital signs were monitored continuously by radiology nursing throughout the procedure under my direct supervision. CONTRAST:  50 cc FLUOROSCOPY: Radiation Exposure Index (as provided by the fluoroscopic device): 9 minutes, 088 mGy Kerma COMPLICATIONS: None PROCEDURE: Informed consent was obtained from the patient following explanation of the procedure, risks, benefits and alternatives. The patient understands, agrees and consents for the procedure. All questions were addressed. A time out was performed prior to the initiation of the procedure. Maximal barrier sterile technique utilized including caps, mask, sterile gowns, sterile gloves, large sterile drape, hand hygiene, and Betadine prep. Ultrasound survey of the right inguinal region was performed with images stored and sent to PACs, confirming patency of the vessel. A micropuncture needle was used access the right common femoral artery under ultrasound. With excellent arterial blood flow  returned, and an .018 micro wire was passed through the needle, observed enter the abdominal aorta under fluoroscopy. The needle was removed, and a micropuncture sheath was placed over the wire. The inner dilator and wire were removed, and an 035 Bentson wire was advanced under fluoroscopy into the abdominal aorta. The sheath was removed and a standard 5 [place] vascular sheath was placed. The dilator was removed and the sheath was flushed. C2 cobra catheter was advanced into the abdominal aorta and used to select the SMA. Angiogram was performed. The catheter was un stable at the origin of the SMA, and attempt at advancing distally with the Glidewire was unsuccessful. The catheter was exchanged for a Mickelson catheter. Tip of  the Mickelson catheter were used to select the SMA origin. With a stable catheter position, a microcatheter coaxial system was advanced within 025 penumbra lantern with 014 fathom wire. Catheter was placed into the right colic artery. Angiogram was performed. Catheter was then advanced into the descending branch of the ileocolic artery. Angiogram was performed. Microcatheter was addended advanced into the marginal artery, that were supplying the Vasa recta in the region of interest. The catheter would not advance into the specific Vasa recta of interest, and as such the origin of the Vasa recta near branch point of the marginal artery were coil embolized with a single 2 mm coil and a single 1 mm coil. Catheter was withdrawn and angiogram was performed. At the conclusion, there was no further hemorrhage identified. All catheters wires were withdrawn. A Celt was deployed for hemostasis. Patient tolerated the procedure well and remained hemodynamically stable throughout. No complications were encountered and no significant blood loss. 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 Celt deployed for hemostasis. Signed, Dulcy Fanny. Nadene Rubins, RPVI [facility] Radiology [facility] Radiology Electronically Signed   By: Corrie Mckusick D.O.   On: 02/27/2022 08:48   IR Angiogram Visceral Selective  Result Date: 02/27/2022 INDICATION: 87 year old female with acute right-sided GI hemorrhage EXAM: ULTRASOUND-GUIDED ACCESS RIGHT COMMON FEMORAL ARTERY MESENTERIC ANGIOGRAM SUPER SELECTIVE CATHETER POSITION TO THE VASA RECTA OF THE CECUM EMBOLIZATION OF THE VASA RECTA FOR EMERGENT GI HEMORRHAGE CELT FOR HEMOSTASIS MEDICATIONS: None ANESTHESIA/SEDATION:  Moderate (conscious) sedation was employed during this procedure. A total of Versed 1.0 mg and Fentanyl 50 mcg was administered intravenously by the radiology nurse. Total intra-service moderate Sedation Time: 39 minutes. The patient's level of consciousness and vital signs were monitored continuously by radiology nursing throughout the procedure under my direct supervision. CONTRAST:  50 cc FLUOROSCOPY: Radiation Exposure Index (as provided by the fluoroscopic device): 9 minutes, 914 mGy Kerma COMPLICATIONS: None PROCEDURE: Informed consent was obtained from the patient following explanation of the procedure, risks, benefits and alternatives. The patient understands, agrees and consents for the procedure. All questions were addressed. A time out was performed prior to the initiation of the procedure. Maximal barrier sterile technique utilized including caps, mask, sterile gowns, sterile gloves, large sterile drape, hand hygiene, and Betadine prep. Ultrasound survey of the right inguinal region was performed with images stored and sent to PACs, confirming patency of the vessel. A micropuncture needle was used access the right common femoral artery under ultrasound. With excellent arterial blood flow returned, and an .018 micro wire was passed through the needle, observed  enter the abdominal aorta under fluoroscopy. The needle was removed, and a micropuncture sheath was placed over the wire. The inner dilator and wire were removed, and an 035 Bentson wire was advanced under fluoroscopy into the abdominal aorta. The sheath was removed and a standard 5 [place] vascular sheath was placed. The dilator was removed and the sheath was flushed. C2 cobra catheter was advanced into the abdominal aorta and used to select the SMA. Angiogram was performed. The catheter was un stable at the origin of the SMA, and attempt at advancing distally with the Glidewire was unsuccessful. The catheter was exchanged for a Mickelson catheter.  Tip of the Mickelson catheter were used to select the SMA origin. With a stable catheter position, a microcatheter coaxial system was advanced within 025 penumbra lantern with 014 fathom wire. Catheter was placed into the right colic artery. Angiogram was performed. Catheter was then advanced into the descending branch of the ileocolic artery. Angiogram was performed. Microcatheter was addended advanced into the marginal artery, that were supplying the Vasa recta in the region of interest. The catheter would not advance into the specific Vasa recta of interest, and as such the origin of the Vasa recta near branch point of the marginal artery were coil embolized with a single 2 mm coil and a single 1 mm coil. Catheter was withdrawn and angiogram was performed. At the conclusion, there was no further hemorrhage identified. All catheters wires were withdrawn. A Celt was deployed for hemostasis. Patient tolerated the procedure well and remained hemodynamically stable throughout. No complications were encountered and no significant blood loss. 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 Celt deployed for hemostasis. Signed, Dulcy Fanny. Nadene Rubins, RPVI [facility] Inter[facility] Radiology Special[facility] Radiology Electronically Signed   By: Corrie Mckusick D.O.   On: 02/27/2022 08:48   IR Angiogram Selective Each Additional Vessel  Result Date: 02/27/2022 INDICATION: 87 year old female with acute right-sided GI hemorrhage EXAM: ULTRASOUND-GUIDED ACCESS RIGHT COMMON FEMORAL ARTERY MESENTERIC ANGIOGRAM SUPER SELECTIVE CATHETER POSITION TO THE VASA RECTA OF THE CECUM EMBOLIZATION OF THE VASA RECTA FOR EMERGENT GI HEMORRHAGE CELT FOR HEMOSTASIS MEDICATIONS: None  ANESTHESIA/SEDATION: Moderate (conscious) sedation was employed during this procedure. A total of Versed 1.0 mg and Fentanyl 50 mcg was administered intravenously by the radiology nurse. Total intra-service moderate Sedation Time: 39 minutes. The patient's level of consciousness and vital signs were monitored continuously by radiology nursing throughout the procedure under my direct supervision. CONTRAST:  50 cc FLUOROSCOPY: Radiation Exposure Index (as provided by the fluoroscopic device): 9 minutes, 283 mGy Kerma COMPLICATIONS: None PROCEDURE: Informed consent was obtained from the patient following explanation of the procedure, risks, benefits and alternatives. The patient understands, agrees and consents for the procedure. All questions were addressed. A time out was performed prior to the initiation of the procedure. Maximal barrier sterile technique utilized including caps, mask, sterile gowns, sterile gloves, large sterile drape, hand hygiene, and Betadine prep. Ultrasound survey of the right inguinal region was performed with images stored and sent to PACs, confirming patency of the vessel. A micropuncture needle was used access the right common femoral artery under ultrasound. With excellent arterial blood flow returned, and an .018 micro wire was passed through the needle, observed enter the abdominal aorta under fluoroscopy. The needle was removed,  and a micropuncture sheath was placed over the wire. The inner dilator and wire were removed, and an 035 Bentson wire was advanced under fluoroscopy into the abdominal aorta. The sheath was removed and a standard 5 [place] vascular sheath was placed. The dilator was removed and the sheath was flushed. C2 cobra catheter was advanced into the abdominal aorta and used to select the SMA. Angiogram was performed. The catheter was un stable at the origin of the SMA, and attempt at advancing distally with the Glidewire was unsuccessful. The catheter was exchanged for a  Mickelson catheter. Tip of the Mickelson catheter were used to select the SMA origin. With a stable catheter position, a microcatheter coaxial system was advanced within 025 penumbra lantern with 014 fathom wire. Catheter was placed into the right colic artery. Angiogram was performed. Catheter was then advanced into the descending branch of the ileocolic artery. Angiogram was performed. Microcatheter was addended advanced into the marginal artery, that were supplying the Vasa recta in the region of interest. The catheter would not advance into the specific Vasa recta of interest, and as such the origin of the Vasa recta near branch point of the marginal artery were coil embolized with a single 2 mm coil and a single 1 mm coil. Catheter was withdrawn and angiogram was performed. At the conclusion, there was no further hemorrhage identified. All catheters wires were withdrawn. A Celt was deployed for hemostasis. Patient tolerated the procedure well and remained hemodynamically stable throughout. No complications were encountered and no significant blood loss. 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 Celt deployed for hemostasis. Signed, Dulcy Fanny. Nadene Rubins, RPVI [facility] Inter[facility] Radiology Special[facility] Radiology Electronically Signed   By: Corrie Mckusick D.O.   On: 02/27/2022 08:48   IR Angiogram Selective Each Additional Vessel  Result Date: 02/27/2022 INDICATION: 87 year old female with acute right-sided GI hemorrhage EXAM: ULTRASOUND-GUIDED ACCESS RIGHT COMMON FEMORAL ARTERY MESENTERIC ANGIOGRAM SUPER SELECTIVE CATHETER POSITION TO THE VASA RECTA OF THE CECUM EMBOLIZATION OF THE VASA RECTA FOR EMERGENT GI HEMORRHAGE CELT FOR  HEMOSTASIS MEDICATIONS: None ANESTHESIA/SEDATION: Moderate (conscious) sedation was employed during this procedure. A total of Versed 1.0 mg and Fentanyl 50 mcg was administered intravenously by the radiology nurse. Total intra-service moderate Sedation Time: 39 minutes. The patient's level of consciousness and vital signs were monitored continuously by radiology nursing throughout the procedure under my direct supervision. CONTRAST:  50 cc FLUOROSCOPY: Radiation Exposure Index (as provided by the fluoroscopic device): 9 minutes, 144 mGy Kerma COMPLICATIONS: None PROCEDURE: Informed consent was obtained from the patient following explanation of the procedure, risks, benefits and alternatives. The patient understands, agrees and consents for the procedure. All questions were addressed. A time out was performed prior to the initiation of the procedure. Maximal barrier sterile technique utilized including caps, mask, sterile gowns, sterile gloves, large sterile drape, hand hygiene, and Betadine prep. Ultrasound survey of the right inguinal region was performed with images stored and sent to PACs, confirming patency of the vessel. A micropuncture needle was used access the right common femoral artery under ultrasound. With excellent arterial blood flow returned, and an .018 micro wire was passed through the needle, observed enter the abdominal aorta under fluoroscopy. The needle was removed, and a micropuncture sheath was placed over the wire. The  inner dilator and wire were removed, and an 035 Bentson wire was advanced under fluoroscopy into the abdominal aorta. The sheath was removed and a standard 5 [place] vascular sheath was placed. The dilator was removed and the sheath was flushed. C2 cobra catheter was advanced into the abdominal aorta and used to select the SMA. Angiogram was performed. The catheter was un stable at the origin of the SMA, and attempt at advancing distally with the Glidewire was unsuccessful. The  catheter was exchanged for a Mickelson catheter. Tip of the Mickelson catheter were used to select the SMA origin. With a stable catheter position, a microcatheter coaxial system was advanced within 025 penumbra lantern with 014 fathom wire. Catheter was placed into the right colic artery. Angiogram was performed. Catheter was then advanced into the descending branch of the ileocolic artery. Angiogram was performed. Microcatheter was addended advanced into the marginal artery, that were supplying the Vasa recta in the region of interest. The catheter would not advance into the specific Vasa recta of interest, and as such the origin of the Vasa recta near branch point of the marginal artery were coil embolized with a single 2 mm coil and a single 1 mm coil. Catheter was withdrawn and angiogram was performed. At the conclusion, there was no further hemorrhage identified. All catheters wires were withdrawn. A Celt was deployed for hemostasis. Patient tolerated the procedure well and remained hemodynamically stable throughout. No complications were encountered and no significant blood loss. 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 Celt deployed for hemostasis. Signed, Dulcy Fanny. Nadene Rubins, RPVI [facility] Radiology Electronically Signed   By: Corrie Mckusick D.O.   On: 02/27/2022 08:48   IR Angiogram Selective Each Additional Vessel  Result Date: 02/27/2022 INDICATION: 87 year old female with acute right-sided GI hemorrhage EXAM: ULTRASOUND-GUIDED ACCESS RIGHT COMMON FEMORAL ARTERY MESENTERIC ANGIOGRAM SUPER SELECTIVE CATHETER POSITION TO THE VASA RECTA OF THE CECUM EMBOLIZATION OF THE VASA RECTA FOR EMERGENT GI  HEMORRHAGE CELT FOR HEMOSTASIS MEDICATIONS: None ANESTHESIA/SEDATION: Moderate (conscious) sedation was employed during this procedure. A total of Versed 1.0 mg and Fentanyl 50 mcg was administered intravenously by the radiology nurse. Total intra-service moderate Sedation Time: 39 minutes. The patient's level of consciousness and vital signs were monitored continuously by radiology nursing throughout the procedure under my direct supervision. CONTRAST:  50 cc FLUOROSCOPY: Radiation Exposure Index (as provided by the fluoroscopic device): 9 minutes, 563 mGy Kerma COMPLICATIONS: None PROCEDURE: Informed consent was obtained from the patient following explanation of the procedure, risks, benefits and alternatives. The patient understands, agrees and consents for the procedure. All questions were addressed. A time out was performed prior to the initiation of the procedure. Maximal barrier sterile technique utilized including caps, mask, sterile gowns, sterile gloves, large sterile drape, hand hygiene, and Betadine prep. Ultrasound survey of the right inguinal region was performed with images stored and sent to PACs, confirming patency of the vessel. A micropuncture needle was used access the right common femoral artery under ultrasound. With excellent arterial blood flow returned, and an .018 micro wire was passed through the needle, observed enter the abdominal aorta under fluoroscopy. The needle was removed, and a micropuncture sheath was placed over the wire. The inner dilator and wire were removed, and an 035 Bentson  wire was advanced under fluoroscopy into the abdominal aorta. The sheath was removed and a standard 5 [place] vascular sheath was placed. The dilator was removed and the sheath was flushed. C2 cobra catheter was advanced into the abdominal aorta and used to select the SMA. Angiogram was performed. The catheter was un stable at the origin of the SMA, and attempt at advancing distally with the Glidewire  was unsuccessful. The catheter was exchanged for a Mickelson catheter. Tip of the Mickelson catheter were used to select the SMA origin. With a stable catheter position, a microcatheter coaxial system was advanced within 025 penumbra lantern with 014 fathom wire. Catheter was placed into the right colic artery. Angiogram was performed. Catheter was then advanced into the descending branch of the ileocolic artery. Angiogram was performed. Microcatheter was addended advanced into the marginal artery, that were supplying the Vasa recta in the region of interest. The catheter would not advance into the specific Vasa recta of interest, and as such the origin of the Vasa recta near branch point of the marginal artery were coil embolized with a single 2 mm coil and a single 1 mm coil. Catheter was withdrawn and angiogram was performed. At the conclusion, there was no further hemorrhage identified. All catheters wires were withdrawn. A Celt was deployed for hemostasis. Patient tolerated the procedure well and remained hemodynamically stable throughout. No complications were encountered and no significant blood loss. 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 Celt deployed for hemostasis. Signed, Dulcy Fanny. Nadene Rubins, RPVI [facility] Inter[facility] Radiology [facility] Radiology Electronically Signed   By: Corrie Mckusick D.O.   On: 02/27/2022 08:48   ECHOCARDIOGRAM COMPLETE  Result Date: 02/26/2022    ECHOCARDIOGRAM REPORT   Patient Name:   Kaitlin Gomez Date of Exam: 02/26/2022 Medical Rec #:  [id number]      Height:       67.0 in Accession #:    [id number]     Weight:       189.0 lb Date of Birth:  1934-06-18      BSA:          1.974 m Patient Age:     10 years       BP:           134/57 mmHg Patient Gender: F              HR:           72 bpm. Exam Location:  Inpatient Procedure: 2D Echo Indications:    bradycardia  History:        Patient has no prior history of Echocardiogram examinations.                 Arrythmias:Bradycardia; Risk Factors:Hypertension, Diabetes and                 Dyslipidemia.  Sonographer:    Harvie Junior Referring Phys: [id number] [facility]  Sonographer Comments: Technically difficult study due to poor echo windows and patient is obese. Image acquisition challenging due to patient body habitus and supine. IMPRESSIONS  1. Left ventricular ejection fraction, by estimation, is 60 to 65%. The left ventricle has normal function. The left ventricle has no regional wall motion abnormalities. There is mild left ventricular hypertrophy. Left ventricular diastolic  parameters are consistent with Grade I diastolic dysfunction (impaired relaxation).  2. Right ventricular systolic function is normal. The right ventricular size is normal. There is normal pulmonary artery systolic pressure. The estimated right ventricular systolic pressure is 38.9 mmHg.  3. The mitral valve is degenerative. No evidence of mitral valve regurgitation. Moderate mitral annular calcification.  4. The aortic valve was not well visualized. Aortic valve regurgitation is not visualized. No aortic stenosis is present. FINDINGS  Left Ventricle: Left ventricular ejection fraction, by estimation, is 60 to 65%. The left ventricle has normal function. The left ventricle has no regional wall motion abnormalities. The left ventricular internal cavity size was normal in size. There is  mild left ventricular hypertrophy. Left ventricular diastolic parameters are consistent with Grade I diastolic dysfunction (impaired relaxation). Right Ventricle: The right ventricular size is normal. No increase in right ventricular wall thickness. Right ventricular systolic function is normal. There is  normal pulmonary artery systolic pressure. The tricuspid regurgitant velocity is 2.41 m/s, and  with an assumed right atrial pressure of 3 mmHg, the estimated right ventricular systolic pressure is 37.3 mmHg. Left Atrium: Left atrial size was normal in size. Right Atrium: Right atrial size was normal in size. Pericardium: There is no evidence of pericardial effusion. Mitral Valve: The mitral valve is degenerative in appearance. Moderate mitral annular calcification. No evidence of mitral valve regurgitation. Tricuspid Valve: The tricuspid valve is normal in structure. Tricuspid valve regurgitation is trivial. Aortic Valve: The aortic valve was not well visualized. Aortic valve regurgitation is not visualized. No aortic stenosis is present. Aortic valve mean gradient measures 4.0 mmHg. Aortic valve peak gradient measures 7.6 mmHg. Aortic valve area, by VTI measures 2.10 cm. Pulmonic Valve: The pulmonic valve was not well visualized. Pulmonic valve regurgitation is not visualized. Aorta: The aortic root is normal in size and structure. IAS/Shunts: The interatrial septum was not well visualized.  LEFT VENTRICLE PLAX 2D LVIDd:         4.60 cm     Diastology LVIDs:         3.40 cm     LV e' medial:    5.77 cm/s LV PW:         1.20 cm     LV E/e' medial:  15.6 LV IVS:        1.10 cm     LV e' lateral:   8.81 cm/s LVOT diam:     1.70 cm     LV E/e' lateral: 10.2 LV SV:         56 LV SV Index:   28 LVOT Area:     2.27 cm  LV Volumes (MOD) LV vol d, MOD A2C: 66.7 ml LV vol d, MOD A4C: 83.5 ml LV vol s, MOD A2C: 25.3 ml LV vol s, MOD A4C: 26.7 ml LV SV MOD A2C:     41.4 ml LV SV MOD A4C:     83.5 ml LV SV MOD BP:      49.7 ml RIGHT VENTRICLE RV Basal diam:  3.50 cm RV Mid diam:    3.20 cm RV S prime:     22.80 cm/s TAPSE (M-mode): 3.0 cm LEFT ATRIUM             Index        RIGHT ATRIUM           Index LA diam:        3.80 cm 1.92 cm/m   RA Area:  12.30 cm LA Vol (A2C):   40.1 ml 20.31 ml/m  RA Volume:   26.30 ml   13.32 ml/m LA Vol (A4C):   37.1 ml 18.79 ml/m LA Biplane Vol: 40.6 ml 20.57 ml/m  AORTIC VALVE                     PULMONIC VALVE AV Area (Vmax):    2.01 cm      PV Vmax:       1.76 m/s AV Area (Vmean):   1.67 cm      PV Peak grad:  12.4 mmHg AV Area (VTI):     2.10 cm AV Vmax:           138.00 cm/s AV Vmean:          102.000 cm/s AV VTI:            0.265 m AV Peak Grad:      7.6 mmHg AV Mean Grad:      4.0 mmHg LVOT Vmax:         122.00 cm/s LVOT Vmean:        75.000 cm/s LVOT VTI:          0.245 m LVOT/AV VTI ratio: 0.92  AORTA Ao Root diam: 3.10 cm MITRAL VALVE                TRICUSPID VALVE MV Area (PHT): 2.99 cm     TR Peak grad:   23.2 mmHg MV Decel Time: 254 msec     TR Vmax:        241.00 cm/s MV E velocity: 90.00 cm/s MV A velocity: 112.00 cm/s  SHUNTS MV E/A ratio:  0.80         Systemic VTI:  0.24 m                             Systemic Diam: 1.70 cm Oswaldo Milian MD Electronically signed by Oswaldo Milian MD Signature Date/Time: 02/26/2022/11:04:31 AM    Final     Family Communication: None at bedside  Disposition: Status is: Inpatient Remains inpatient appropriate because: Continued monitoring for blood loss anemia  Planned Discharge Destination: Home  Patrecia Pour, MD 02/27/2022 2:38 PM Page by [email or web address]

## 2022-02-27 NOTE — Progress Notes (Signed)
Referring Physician(s): Dr Mathews Robinsons Dr Madelon Lips  Supervising Physician: Mir, Sharen Heck  Patient Status:  [facility] - In-pt  Chief Complaint:  GI Bleed  Subjective:  10/1 IR Procedure Procedure:  US guided access right CFA Mesenteric angiogram, targeting known site of right colonic bleeding.  Embolization of the vasa recta supplying the diverticular bleed, right colon.  CELT for hemostasis.  Findings: Angio confirms right colon diverticular hemorrhage.  Treated with coil embolization of the vasa recta right colon. To stasis.   Hg this am 8.3 (10.2) Pt states she still has small bloody BMs Not sure of color or amount No transfusion ordered VSS    Allergies: Advil [ibuprofen]  Medications: Prior to Admission medications   Medication Sig Start Date End Date Taking? Authorizing Provider  amLODipine (NORVASC) 10 MG tablet Take 10 mg by mouth daily.   Yes [provider]  aspirin EC 81 MG tablet Take 81 mg by mouth daily. Swallow whole.   Yes [provider]  atenolol (TENORMIN) 50 MG tablet Take 50 mg by mouth daily.   Yes [provider]  calcium-vitamin D (OSCAL 500/200 D-3) 500-200 MG-UNIT per tablet Take 1 tablet by mouth daily.   Yes [provider]  cloNIDine (CATAPRES) 0.3 MG tablet Take 0.3 mg by mouth at bedtime.    Yes [provider]  furosemide (LASIX) 40 MG tablet Take 40 mg by mouth daily.   Yes [provider]  lovastatin (MEVACOR) 40 MG tablet Take 40 mg by mouth at bedtime.   Yes [provider]  metFORMIN (GLUCOPHAGE-XR) 500 MG 24 hr tablet Take 500 mg by mouth daily. 10/09/16  Yes [provider]  quinapril (ACCUPRIL) 40 MG tablet Take 40 mg by mouth 2 (two) times daily.   Yes [provider]     Vital Signs: BP (!) 154/60 (BP Location: Left Arm)   Pulse 82   Temp 98.9 F (37.2 C) (Oral)   Resp 18   SpO2 97%   Physical Exam Pulmonary:     Effort: Pulmonary effort is  normal.     Breath sounds: Normal breath sounds.  Skin:    General: Skin is warm.     Comments: Rt groin NT no bleeding No hematoma Good pulse foot  Neurological:     Mental Status: She is oriented to person, place, and time.  Psychiatric:        Behavior: Behavior normal.     Imaging: ECHOCARDIOGRAM COMPLETE  Result Date: 02/26/2022    ECHOCARDIOGRAM REPORT   Patient Name:   MANUELA HALBUR Date of Exam: 02/26/2022 Medical Rec #:  [id number]      Height:       67.0 in Accession #:    [id number]     Weight:       189.0 lb Date of Birth:  1935/02/04      BSA:          1.974 m Patient Age:    87 years       BP:           134/57 mmHg Patient Gender: F              HR:           72 bpm. Exam Location:  Inpatient Procedure: 2D Echo Indications:    bradycardia  History:        Patient has no prior history of Echocardiogram examinations.  Arrythmias:Bradycardia; Risk Factors:Hypertension, Diabetes and                 Dyslipidemia.  Sonographer:    Harvie Junior Referring Phys: [id number] [facility]  Sonographer Comments: Technically difficult study due to poor echo windows and patient is obese. Image acquisition challenging due to patient body habitus and supine. IMPRESSIONS  1. Left ventricular ejection fraction, by estimation, is 60 to 65%. The left ventricle has normal function. The left ventricle has no regional wall motion abnormalities. There is mild left ventricular hypertrophy. Left ventricular diastolic parameters are consistent with Grade I diastolic dysfunction (impaired relaxation).  2. Right ventricular systolic function is normal. The right ventricular size is normal. There is normal pulmonary artery systolic pressure. The estimated right ventricular systolic pressure is 93.8 mmHg.  3. The mitral valve is degenerative. No evidence of mitral valve regurgitation. Moderate mitral annular calcification.  4. The aortic valve was not well visualized. Aortic valve regurgitation is not  visualized. No aortic stenosis is present. FINDINGS  Left Ventricle: Left ventricular ejection fraction, by estimation, is 60 to 65%. The left ventricle has normal function. The left ventricle has no regional wall motion abnormalities. The left ventricular internal cavity size was normal in size. There is  mild left ventricular hypertrophy. Left ventricular diastolic parameters are consistent with Grade I diastolic dysfunction (impaired relaxation). Right Ventricle: The right ventricular size is normal. No increase in right ventricular wall thickness. Right ventricular systolic function is normal. There is normal pulmonary artery systolic pressure. The tricuspid regurgitant velocity is 2.41 m/s, and  with an assumed right atrial pressure of 3 mmHg, the estimated right ventricular systolic pressure is 10.1 mmHg. Left Atrium: Left atrial size was normal in size. Right Atrium: Right atrial size was normal in size. Pericardium: There is no evidence of pericardial effusion. Mitral Valve: The mitral valve is degenerative in appearance. Moderate mitral annular calcification. No evidence of mitral valve regurgitation. Tricuspid Valve: The tricuspid valve is normal in structure. Tricuspid valve regurgitation is trivial. Aortic Valve: The aortic valve was not well visualized. Aortic valve regurgitation is not visualized. No aortic stenosis is present. Aortic valve mean gradient measures 4.0 mmHg. Aortic valve peak gradient measures 7.6 mmHg. Aortic valve area, by VTI measures 2.10 cm. Pulmonic Valve: The pulmonic valve was not well visualized. Pulmonic valve regurgitation is not visualized. Aorta: The aortic root is normal in size and structure. IAS/Shunts: The interatrial septum was not well visualized.  LEFT VENTRICLE PLAX 2D LVIDd:         4.60 cm     Diastology LVIDs:         3.40 cm     LV e' medial:    5.77 cm/s LV PW:         1.20 cm     LV E/e' medial:  15.6 LV IVS:        1.10 cm     LV e' lateral:   8.81 cm/s LVOT  diam:     1.70 cm     LV E/e' lateral: 10.2 LV SV:         56 LV SV Index:   28 LVOT Area:     2.27 cm  LV Volumes (MOD) LV vol d, MOD A2C: 66.7 ml LV vol d, MOD A4C: 83.5 ml LV vol s, MOD A2C: 25.3 ml LV vol s, MOD A4C: 26.7 ml LV SV MOD A2C:     41.4 ml LV SV MOD A4C:  83.5 ml LV SV MOD BP:      49.7 ml RIGHT VENTRICLE RV Basal diam:  3.50 cm RV Mid diam:    3.20 cm RV S prime:     22.80 cm/s TAPSE (M-mode): 3.0 cm LEFT ATRIUM             Index        RIGHT ATRIUM           Index LA diam:        3.80 cm 1.92 cm/m   RA Area:     12.30 cm LA Vol (A2C):   40.1 ml 20.31 ml/m  RA Volume:   26.30 ml  13.32 ml/m LA Vol (A4C):   37.1 ml 18.79 ml/m LA Biplane Vol: 40.6 ml 20.57 ml/m  AORTIC VALVE                     PULMONIC VALVE AV Area (Vmax):    2.01 cm      PV Vmax:       1.76 m/s AV Area (Vmean):   1.67 cm      PV Peak grad:  12.4 mmHg AV Area (VTI):     2.10 cm AV Vmax:           138.00 cm/s AV Vmean:          102.000 cm/s AV VTI:            0.265 m AV Peak Grad:      7.6 mmHg AV Mean Grad:      4.0 mmHg LVOT Vmax:         122.00 cm/s LVOT Vmean:        75.000 cm/s LVOT VTI:          0.245 m LVOT/AV VTI ratio: 0.92  AORTA Ao Root diam: 3.10 cm MITRAL VALVE                TRICUSPID VALVE MV Area (PHT): 2.99 cm     TR Peak grad:   23.2 mmHg MV Decel Time: 254 msec     TR Vmax:        241.00 cm/s MV E velocity: 90.00 cm/s MV A velocity: 112.00 cm/s  SHUNTS MV E/A ratio:  0.80         Systemic VTI:  0.24 m                             Systemic Diam: 1.70 cm Oswaldo Milian MD Electronically signed by Oswaldo Milian MD Signature Date/Time: 02/26/2022/11:04:31 AM    Final    CT Angio Abd/Pel W and/or Wo Contrast  Result Date: 02/25/2022 CLINICAL DATA:  Bright red blood per rectum. History of gastrointestinal hemorrhage. EXAM: CTA ABDOMEN AND PELVIS WITHOUT AND WITH CONTRAST TECHNIQUE: Multidetector CT imaging of the abdomen and pelvis was performed using the standard protocol during bolus  administration of intravenous contrast. Multiplanar reconstructed images and MIPs were obtained and reviewed to evaluate the vascular anatomy. RADIATION DOSE REDUCTION: This exam was performed according to the departmental dose-optimization program which includes automated exposure control, adjustment of the mA and/or kV according to patient size and/or use of iterative reconstruction technique. CONTRAST:  113m OMNIPAQUE IOHEXOL 350 MG/ML SOLN COMPARISON:  CT 01/23/2015 FINDINGS: VASCULAR Aorta: Normal caliber aorta without aneurysm, dissection, vasculitis or significant stenosis. Mild intimal calcification aorta is branches Celiac: Patent without evidence of aneurysm, dissection, vasculitis or significant stenosis.  SMA: Patent without evidence of aneurysm, dissection, vasculitis or significant stenosis. Renals: Both renal arteries are patent without evidence of aneurysm, dissection, vasculitis, fibromuscular dysplasia or significant stenosis. Small accessory renal arteries on LEFT and RIGHT. IMA: Patent without evidence of aneurysm, dissection, vasculitis or significant stenosis. Inflow: Patent without evidence of aneurysm, dissection, vasculitis or significant stenosis. Proximal Outflow: Bilateral common femoral and visualized portions of the superficial and profunda femoral arteries are patent without evidence of aneurysm, dissection, vasculitis or significant stenosis. Veins: No obvious venous abnormality within the limitations of this arterial phase study. Review of the MIP images confirms the above findings. NON-VASCULAR Lower chest: Lung bases are clear. Hepatobiliary: No focal hepatic lesion. No biliary duct dilatation. Common bile duct is normal. Pancreas: Pancreas is normal. No ductal dilatation. No pancreatic inflammation. Spleen: Normal spleen Adrenals/urinary tract: Adrenal glands and kidneys are normal. The ureters and bladder normal. Stomach/Bowel: No evidence of active gastrointestinal bleeding  within the stomach, duodenum or small bowel. The terminal ileum is normal. Within the midportion of the ascending colon there is a focus of high-density contrast within the dependent lumen of the ascending colon (image 108/series 9) which is not present on the noncontrast CT series. On the more delayed venous phase imaging, this intraluminal contrast increases and pools dependently and spreads antegrade. No evidence of additional active hemorrhage within the LEFT colon or rectosigmoid colon. Multiple diverticula through the colon. Vascular/Lymphatic: No periportal or retroperitoneal adenopathy. No pelvic adenopathy. Reproductive: Post hysterectomy.  Adnexa unremarkable Other: No free fluid. Musculoskeletal: No aggressive osseous lesion. IMPRESSION: VASCULAR 1. Active gastrointestinal bleeding in the ascending colon. 2.  Aortic Atherosclerosis (ICD10-I70.0). NON-VASCULAR 1. Mild pan diverticulosis.  No evidence acute diverticulitis. Findings conveyed toSILAS WONG on 02/25/2022  at09:28. Electronically Signed   By: Suzy Bouchard M.D.   On: 02/25/2022 09:29    Labs:  CBC: Recent Labs    02/25/22 1940 02/26/22 0230 02/26/22 0825 02/27/22 0426  WBC 10.3 10.7* 11.4* 9.0  HGB 11.3* 10.9* 10.2* 8.3*  HCT 34.9* 32.1* 30.6* 25.5*  PLT 189 209 214 174    COAGS: Recent Labs    02/25/22 0654  INR 1.2    BMP: Recent Labs    02/25/22 0654 02/26/22 0230 02/27/22 0426  NA 140 142 141  K 3.3* 3.6 3.5  CL 109 108 107  CO2 '23 24 27  '$ GLUCOSE 133* 158* 117*  BUN '16 12 13  '$ CALCIUM 9.2 8.8* 8.6*  CREATININE 1.09* 1.00 0.91  GFRNONAA 50* 55* >60    LIVER FUNCTION TESTS: Recent Labs    02/25/22 0654  BILITOT 0.6  AST 22  ALT 20  ALKPHOS 67  PROT 7.2  ALBUMIN 3.9    Assessment and Plan:  GI Bleed Post IR embolization 10/1 late pm Hg at 8.3 today- would expect some drop after IR procedure Follow Hg trend Vitals stable; in NAD IR will follow few days  Electronically Signed: Lavonia Drafts, PA-C 02/27/2022, 6:59 AM   I spent a total of 15 Minutes at the the patient's bedside AND on the patient's hospital floor or unit, greater than 50% of which was counseling/coordinating care for GI Bleed embolization

## 2022-02-28 DIAGNOSIS — I10 Essential (primary) hypertension: Secondary | ICD-10-CM

## 2022-02-28 DIAGNOSIS — K922 Gastrointestinal hemorrhage, unspecified: Secondary | ICD-10-CM | POA: Diagnosis not present

## 2022-02-28 LAB — CBC
HCT: 27.2 % — ABNORMAL LOW (ref 36.0–46.0)
Hemoglobin: 9.1 g/dL — ABNORMAL LOW (ref 12.0–15.0)
MCH: 30.7 pg (ref 26.0–34.0)
MCHC: 33.5 g/dL (ref 30.0–36.0)
MCV: 91.9 fL (ref 80.0–100.0)
Platelets: 198 K/uL (ref 150–400)
RBC: 2.96 MIL/uL — ABNORMAL LOW (ref 3.87–5.11)
RDW: 14 % (ref 11.5–15.5)
WBC: 10.8 K/uL — ABNORMAL HIGH (ref 4.0–10.5)
nRBC: 0.5 % — ABNORMAL HIGH (ref 0.0–0.2)

## 2022-02-28 LAB — GLUCOSE, CAPILLARY
Glucose-Capillary: 143 mg/dL — ABNORMAL HIGH (ref 70–99)
Glucose-Capillary: 145 mg/dL — ABNORMAL HIGH (ref 70–99)
Glucose-Capillary: 111 mg/dL — ABNORMAL HIGH (ref 70–99)
Glucose-Capillary: 136 mg/dL — ABNORMAL HIGH (ref 70–99)

## 2022-02-28 NOTE — Progress Notes (Signed)
Physical Therapy Treatment Patient Details Name: Kaitlin Gomez MRN: [id number] DOB: 21-Nov-1934 Today's Date: 02/28/2022   History of Present Illness 87 y/o female admitted as a tranfer from [facility] to ED 10/1 for work up in IR for active bleed in her ascending colon.  S/p US guided cmbolization of the vasa recta supplying pt's diverticular bleed right colon on 10/1. PMHx: DM2, HLD, HTN, chron's ds s/p sigmoid colectormy.    PT Comments    Pt received in supine, agreeable to therapy session, with good participation in gait and transfer training. Pt with poor seated balance and would benefit from HEP handout next session to address seated balance/core strengthening as she reports she plans to refuse HHPT services. Pt continues to require Supervision for safety cues with transfers and gait using RW and would benefit from increased initial supervision/assist at home upon DC. Pt reports she has needed DME. Pt continues to benefit from PT services to progress toward functional mobility goals.    Recommendations for follow up therapy are one component of a multi-disciplinary discharge planning process, led by the attending physician.  Recommendations may be updated based on patient status, additional functional criteria and insurance authorization.  Follow Up Recommendations  Home health PT (pt reports she is likely to refuse but would benefit)     Assistance Recommended at Discharge Set up Supervision/Assistance  Patient can return home with the following Assistance with cooking/housework;Assist for transportation   Equipment Recommendations  None recommended by PT (pt has RW and cane at home)    Recommendations for Other Services       Precautions / Restrictions Precautions Precautions: Fall Restrictions Weight Bearing Restrictions: No     Mobility  Bed Mobility Overal bed mobility: Needs Assistance Bed Mobility: Supine to Sit, Sit to Supine     Supine to sit: Min guard Sit to  supine: Min assist   General bed mobility comments: Cues and MGA for safety out of bed. MinA for elevating BLE over EOB, with cues for improved technique    Transfers Overall transfer level: Needs assistance Equipment used: Rolling walker (2 wheels) Transfers: Sit to/from Stand Sit to Stand: Min guard           General transfer comment: Min Guard A for safety, progressed to Supervision with cues needed for safe UE placement    Ambulation/Gait Ambulation/Gait assistance: Supervision Gait Distance (Feet): 150 Feet Assistive device: Rolling walker (2 wheels) Gait Pattern/deviations: Step-through pattern, Trunk flexed       General Gait Details: frequent cues for upright posture and improved proximity to RW; VSS on RA per tele monitor     Balance Overall balance assessment: Needs assistance Sitting-balance support: No upper extremity supported, Feet supported Sitting balance-Leahy Scale: Poor Sitting balance - Comments: frequent posterior LOB seated EOB, pt needing consistent minA to min guard while seated with cues for self-supporting with BUE Postural control: Posterior lean Standing balance support: Bilateral upper extremity supported, During functional activity, Reliant on assistive device for balance Standing balance-Leahy Scale: Fair Standing balance comment: fair using RW; pt able to pull her briefs up with min guard standing in bathroom                            Cognition Arousal/Alertness: Awake/alert Behavior During Therapy: WFL for tasks assessed/performed Overall Cognitive Status: Within Functional Limits for tasks assessed  General Comments: tangential        Exercises      General Comments General comments (skin integrity, edema, etc.): VSS on RA, HR WFL this date with exertion HR 70's-90's bpm. Some blood in toilet after BM so RN notified, did not flush so it could be charted.      Pertinent  Vitals/Pain Pain Assessment Pain Assessment: No/denies pain           PT Goals (current goals can now be found in the care plan section) Acute Rehab PT Goals Patient Stated Goal: feeling that I can be independent PT Goal Formulation: With patient Time For Goal Achievement: 03/12/22 Progress towards PT goals: Progressing toward goals    Frequency    Min 3X/week      PT Plan Current plan remains appropriate       AM-PAC PT "6 Clicks" Mobility   Outcome Measure  Help needed turning from your back to your side while in a flat bed without using bedrails?: A Little Help needed moving from lying on your back to sitting on the side of a flat bed without using bedrails?: A Little Help needed moving to and from a bed to a chair (including a wheelchair)?: A Little Help needed standing up from a chair using your arms (e.g., wheelchair or bedside chair)?: A Little Help needed to walk in hospital room?: A Little Help needed climbing 3-5 steps with a railing? : A Lot 6 Click Score: 17    End of Session Equipment Utilized During Treatment: Gait belt Activity Tolerance: Patient tolerated treatment well Patient left: in bed;with call bell/phone within reach;with bed alarm set;with SCD's reapplied Nurse Communication: Mobility status;Other (comment) (some blood in stool) PT Visit Diagnosis: Unsteadiness on feet (R26.81);Muscle weakness (generalized) (M62.81)     Time: 0240-9735 PT Time Calculation (min) (ACUTE ONLY): 30 min  Charges:  $Gait Training: 8-22 mins $Therapeutic Activity: 8-22 mins                     Gessica Jawad P., PTA Acute Rehabilitation Services Secure Chat Preferred 9a-5:30pm Office: [place] 02/28/2022, 1:55 PM

## 2022-02-28 NOTE — Progress Notes (Addendum)
Progress Note  Patient: Kaitlin Gomez MBT:[id number] DOB: March 03, 1935  DOA: 02/25/2022  DOS: 02/28/2022    Brief hospital course:  Kaitlin Gomez is an 87 y.o. female with a history of Crohn's disease s/p sigmoid colectomy, colovesical fistula repair 1995 at [facility], T2DM, HTN, HLD who presented to [facility] with several episodes of hematochezia found to have active bleeding in the ascending colon by CT angiography, transferred to [facility] for IR evaluation and subsequently underwent angiography with coil embolization. Hemoglobin 14.3 on arrival, now down to 8.3g/dl.  Assessment and Plan:  Diverticular bleed in ascending colon: -  now s/p coil embolization by IR Dr. Earleen Newport 10/1. Previous colonoscopy showed diverticulosis, suspected cause of painless hematochezia despite hx IBD.  - Monitor H/H as below  - ADAT - Do not submerge R CFA access site for 7 days. No longer requires any bedrest.   ABLA due to GI bleed:  -Globin 8.3 yesterday, will repeat stat CBC today, transfuse PRBC if hemoglobin continues to drop .  - Continue holding ASA    Sinus bradycardia, 1st degree AVB: Improved when beta blocker held. TSH 0.811. Limited echo showed preserved LVEF and no wall motion abnormalities.  - To help with BP control, home atenolol restarted, modest bradycardia which is asymptomatic is noted.   Hypokalemia: Resolved.   Crohn's disease: No evidence of flare at this time.  - Suggest she reengage with GI as outpatient, hasn't been seen since 2018.   T2DM: Well-controlled with HbA1c 6.3%.  - Hold metformin with recent contrast administration (bicarb wnl at 24) - Sensitive SSI  Resistant HTN:  - Continue home medications.  - Restart home dose clonidine.  - Continue norvasc - Restart lasix, ACEi as Cr is stable, wnl, legs are edematous, and BP still elevated  HLD:  - Continue statin  Subjective:      No significant events overnight, she denies any complaints, she had no BM earlier this morning, but I  have been told by the staff she had another BM early afternoon with some dark blood  Objective: Vitals:   02/27/22 0819 02/27/22 1212 02/27/22 2110 02/28/22 0817  BP: (!) 149/53 (!) 146/65 (!) 162/59 (!) 133/53  Pulse: 62 (!) 58 61   Resp: '18 17 19 20  '$ Temp: 98 F (36.7 C)  98.8 F (37.1 C) 98 F (36.7 C)  TempSrc:   Oral Oral  SpO2: 100% 99% 97%    Gen: 87 y.o. female in no distress Awake Alert, Oriented X 3, No new F.N deficits, Normal affect Symmetrical Chest wall movement, Good air movement bilaterally, CTAB RRR,No Gallops,Rubs or new Murmurs, No Parasternal Heave +ve B.Sounds, Abd Soft, No tenderness, No rebound - guarding or rigidity. No Cyanosis, Clubbing or edema, No new Rash or bruise     Data Personally reviewed: CBC: Recent Labs  Lab 02/25/22 1425 02/25/22 1940 02/26/22 0230 02/26/22 0825 02/27/22 0426  WBC 9.7 10.3 10.7* 11.4* 9.0  NEUTROABS  --   --   --  9.2*  --   HGB 12.1 11.3* 10.9* 10.2* 8.3*  HCT 37.6 34.9* 32.1* 30.6* 25.5*  MCV 92.8 93.6 92.5 93.3 93.4  PLT 222 189 209 214 536   Basic Metabolic Panel: Recent Labs  Lab 02/25/22 0654 02/25/22 1425 02/26/22 0230 02/27/22 0426  NA 140  --  142 141  K 3.3*  --  3.6 3.5  CL 109  --  108 107  CO2 23  --  24 27  GLUCOSE 133*  --  158* 117*  BUN 16  --  12 13  CREATININE 1.09*  --  1.00 0.91  CALCIUM 9.2  --  8.8* 8.6*  MG  --  1.5*  --   --    GFR: Estimated Creatinine Clearance: 50.8 mL/min (by C-G formula based on SCr of 0.91 mg/dL). Liver Function Tests: Recent Labs  Lab 02/25/22 0654  AST 22  ALT 20  ALKPHOS 67  BILITOT 0.6  PROT 7.2  ALBUMIN 3.9   No results for input(s): "LIPASE", "AMYLASE" in the last 168 hours. No results for input(s): "AMMONIA" in the last 168 hours. Coagulation Profile: Recent Labs  Lab 02/25/22 0654  INR 1.2   Cardiac Enzymes: No results for input(s): "CKTOTAL", "CKMB", "CKMBINDEX", "TROPONINI" in the last 168 hours. BNP (last 3 results) No  results for input(s): "PROBNP" in the last 8760 hours. HbA1C: No results for input(s): "HGBA1C" in the last 72 hours.  CBG: Recent Labs  Lab 02/27/22 1212 02/27/22 1658 02/27/22 2133 02/28/22 0816 02/28/22 1212  GLUCAP 143* 110* 133* 143* 145*   Lipid Profile: No results for input(s): "CHOL", "HDL", "LDLCALC", "TRIG", "CHOLHDL", "LDLDIRECT" in the last 72 hours. Thyroid Function Tests: Recent Labs    02/25/22 1940  TSH 0.811   Anemia Panel: No results for input(s): "VITAMINB12", "FOLATE", "FERRITIN", "TIBC", "IRON", "RETICCTPCT" in the last 72 hours. Urine analysis:    Component Value Date/Time   COLORURINE STRAW (A) 01/23/2015 0802   APPEARANCEUR CLEAR (A) 01/23/2015 0802   APPEARANCEUR Cloudy 10/27/2011 1403   LABSPEC 1.006 01/23/2015 0802   LABSPEC 1.017 10/27/2011 1403   PHURINE 6.0 01/23/2015 0802   GLUCOSEU NEGATIVE 01/23/2015 0802   GLUCOSEU Negative 10/27/2011 1403   HGBUR NEGATIVE 01/23/2015 0802   BILIRUBINUR NEGATIVE 01/23/2015 0802   BILIRUBINUR Negative 10/27/2011 1403   KETONESUR NEGATIVE 01/23/2015 0802   PROTEINUR NEGATIVE 01/23/2015 0802   NITRITE NEGATIVE 01/23/2015 0802   LEUKOCYTESUR TRACE (A) 01/23/2015 0802   LEUKOCYTESUR 3+ 10/27/2011 1403   No results found for this or any previous visit (from the past 240 hour(s)).   No results found.  Family Communication: None at bedside  Disposition: Status is: Inpatient Remains inpatient appropriate because: Continued monitoring for blood loss anemia  Planned Discharge Destination: Home  Phillips Climes, MD 02/28/2022 1:57 PM Page by [email or web address]

## 2022-02-28 NOTE — Progress Notes (Signed)
Referring Physician(s): Dr. Tanda Rockers. Loletha Carrow  Supervising Physician: Sandi Mariscal  Patient Status:  [facility] - In-pt  Chief Complaint: GI Bleed s/p mesenteric angiogram with coil embolization 02/25/22  Subjective: Patient in bed watching TV. She denies pain/discomfort but states she gets a little dizzy when she gets out of bed.   Allergies: Advil [ibuprofen]  Medications: Prior to Admission medications   Medication Sig Start Date End Date Taking? Authorizing Provider  amLODipine (NORVASC) 10 MG tablet Take 10 mg by mouth daily.   Yes [provider]  aspirin EC 81 MG tablet Take 81 mg by mouth daily. Swallow whole.   Yes [provider]  atenolol (TENORMIN) 50 MG tablet Take 50 mg by mouth daily.   Yes [provider]  calcium-vitamin D (OSCAL 500/200 D-3) 500-200 MG-UNIT per tablet Take 1 tablet by mouth daily.   Yes [provider]  cloNIDine (CATAPRES) 0.3 MG tablet Take 0.3 mg by mouth at bedtime.    Yes [provider]  furosemide (LASIX) 40 MG tablet Take 40 mg by mouth daily.   Yes [provider]  lovastatin (MEVACOR) 40 MG tablet Take 40 mg by mouth at bedtime.   Yes [provider]  metFORMIN (GLUCOPHAGE-XR) 500 MG 24 hr tablet Take 500 mg by mouth daily. 10/09/16  Yes [provider]  quinapril (ACCUPRIL) 40 MG tablet Take 40 mg by mouth 2 (two) times daily.   Yes [provider]     Vital Signs: BP (!) 133/53 (BP Location: Left Arm)   Pulse 61   Temp 98 F (36.7 C) (Oral)   Resp 20   SpO2 97%   Physical Exam Constitutional:      General: She is not in acute distress.    Appearance: She is not ill-appearing.  Cardiovascular:     Comments: Right groin vascular access site is clean, soft, dry and non-tender. Dressing removed.  Pulmonary:     Effort: Pulmonary effort is normal.  Abdominal:     Comments: Patient denies abdominal pain. Patient did have a bowel movement today that had some  dark blood in it.   Skin:    General: Skin is warm and dry.  Neurological:     Mental Status: She is alert and oriented to person, place, and time.     Imaging: IR EMBO ART  VEN HEMORR LYMPH EXTRAV  INC GUIDE ROADMAPPING  Result Date: 02/27/2022 INDICATION: 87 year old female with acute right-sided GI hemorrhage EXAM: ULTRASOUND-GUIDED ACCESS RIGHT COMMON FEMORAL ARTERY MESENTERIC ANGIOGRAM SUPER SELECTIVE CATHETER POSITION TO THE VASA RECTA OF THE CECUM EMBOLIZATION OF THE VASA RECTA FOR EMERGENT GI HEMORRHAGE CELT FOR HEMOSTASIS MEDICATIONS: None ANESTHESIA/SEDATION: Moderate (conscious) sedation was employed during this procedure. A total of Versed 1.0 mg and Fentanyl 50 mcg was administered intravenously by the radiology nurse. Total intra-service moderate Sedation Time: 39 minutes. The patient's level of consciousness and vital signs were monitored continuously by radiology nursing throughout the procedure under my direct supervision. CONTRAST:  50 cc FLUOROSCOPY: Radiation Exposure Index (as provided by the fluoroscopic device): 9 minutes, 474 mGy Kerma COMPLICATIONS: None PROCEDURE: Informed consent was obtained from the patient following explanation of the procedure, risks, benefits and alternatives. The patient understands, agrees and consents for the procedure. All questions were addressed. A time out was performed prior to the initiation of the procedure. Maximal barrier sterile technique utilized including caps, mask, sterile gowns, sterile gloves, large sterile drape, hand hygiene, and Betadine prep. Ultrasound survey of  the right inguinal region was performed with images stored and sent to PACs, confirming patency of the vessel. A micropuncture needle was used access the right common femoral artery under ultrasound. With excellent arterial blood flow returned, and an .018 micro wire was passed through the needle, observed enter the abdominal aorta under fluoroscopy. The needle was removed,  and a micropuncture sheath was placed over the wire. The inner dilator and wire were removed, and an 035 Bentson wire was advanced under fluoroscopy into the abdominal aorta. The sheath was removed and a standard 5 [place] vascular sheath was placed. The dilator was removed and the sheath was flushed. C2 cobra catheter was advanced into the abdominal aorta and used to select the SMA. Angiogram was performed. The catheter was un stable at the origin of the SMA, and attempt at advancing distally with the Glidewire was unsuccessful. The catheter was exchanged for a Mickelson catheter. Tip of the Mickelson catheter were used to select the SMA origin. With a stable catheter position, a microcatheter coaxial system was advanced within 025 penumbra lantern with 014 fathom wire. Catheter was placed into the right colic artery. Angiogram was performed. Catheter was then advanced into the descending branch of the ileocolic artery. Angiogram was performed. Microcatheter was addended advanced into the marginal artery, that were supplying the Vasa recta in the region of interest. The catheter would not advance into the specific Vasa recta of interest, and as such the origin of the Vasa recta near branch point of the marginal artery were coil embolized with a single 2 mm coil and a single 1 mm coil. Catheter was withdrawn and angiogram was performed. At the conclusion, there was no further hemorrhage identified. All catheters wires were withdrawn. A Celt was deployed for hemostasis. Patient tolerated the procedure well and remained hemodynamically stable throughout. No complications were encountered and no significant blood loss. 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 Celt  deployed for hemostasis. Signed, Dulcy Fanny. Nadene Rubins, RPVI [facility] Radiology Electronically Signed   By: Corrie Mckusick D.O.   On: 02/27/2022 08:48   IR US Guide Vasc Access Right  Result Date: 02/27/2022 INDICATION: 87 year old female with acute right-sided GI hemorrhage EXAM: ULTRASOUND-GUIDED ACCESS RIGHT COMMON FEMORAL ARTERY MESENTERIC ANGIOGRAM SUPER SELECTIVE CATHETER POSITION TO THE VASA RECTA OF THE CECUM EMBOLIZATION OF THE VASA RECTA FOR EMERGENT GI HEMORRHAGE CELT FOR HEMOSTASIS MEDICATIONS: None ANESTHESIA/SEDATION: Moderate (conscious) sedation was employed during this procedure. A total of Versed 1.0 mg and Fentanyl 50 mcg was administered intravenously by the radiology nurse. Total intra-service moderate Sedation Time: 39 minutes. The patient's level of consciousness and vital signs were monitored continuously by radiology nursing throughout the procedure under my direct supervision. CONTRAST:  50 cc FLUOROSCOPY: Radiation Exposure Index (as provided by the fluoroscopic device): 9 minutes, 244 mGy Kerma COMPLICATIONS: None PROCEDURE: Informed consent was obtained from the patient following explanation of the procedure, risks, benefits and alternatives. The patient understands, agrees and consents for the procedure. All questions were addressed. A time out was performed prior to the initiation of the procedure. Maximal barrier sterile technique utilized including caps, mask, sterile gowns, sterile gloves, large sterile drape, hand hygiene, and Betadine prep. Ultrasound survey of the right inguinal region was performed with images stored and  sent to PACs, confirming patency of the vessel. A micropuncture needle was used access the right common femoral artery under ultrasound. With excellent arterial blood flow returned, and an .018 micro wire was passed through the needle, observed enter the abdominal aorta under fluoroscopy. The needle was  removed, and a micropuncture sheath was placed over the wire. The inner dilator and wire were removed, and an 035 Bentson wire was advanced under fluoroscopy into the abdominal aorta. The sheath was removed and a standard 5 [place] vascular sheath was placed. The dilator was removed and the sheath was flushed. C2 cobra catheter was advanced into the abdominal aorta and used to select the SMA. Angiogram was performed. The catheter was un stable at the origin of the SMA, and attempt at advancing distally with the Glidewire was unsuccessful. The catheter was exchanged for a Mickelson catheter. Tip of the Mickelson catheter were used to select the SMA origin. With a stable catheter position, a microcatheter coaxial system was advanced within 025 penumbra lantern with 014 fathom wire. Catheter was placed into the right colic artery. Angiogram was performed. Catheter was then advanced into the descending branch of the ileocolic artery. Angiogram was performed. Microcatheter was addended advanced into the marginal artery, that were supplying the Vasa recta in the region of interest. The catheter would not advance into the specific Vasa recta of interest, and as such the origin of the Vasa recta near branch point of the marginal artery were coil embolized with a single 2 mm coil and a single 1 mm coil. Catheter was withdrawn and angiogram was performed. At the conclusion, there was no further hemorrhage identified. All catheters wires were withdrawn. A Celt was deployed for hemostasis. Patient tolerated the procedure well and remained hemodynamically stable throughout. No complications were encountered and no significant blood loss. 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  Celt deployed for hemostasis. Signed, Dulcy Fanny. Nadene Rubins, RPVI [facility] Radiology Electronically Signed   By: Corrie Mckusick D.O.   On: 02/27/2022 08:48   IR Angiogram Visceral Selective  Result Date: 02/27/2022 INDICATION: 87 year old female with acute right-sided GI hemorrhage EXAM: ULTRASOUND-GUIDED ACCESS RIGHT COMMON FEMORAL ARTERY MESENTERIC ANGIOGRAM SUPER SELECTIVE CATHETER POSITION TO THE VASA RECTA OF THE CECUM EMBOLIZATION OF THE VASA RECTA FOR EMERGENT GI HEMORRHAGE CELT FOR HEMOSTASIS MEDICATIONS: None ANESTHESIA/SEDATION: Moderate (conscious) sedation was employed during this procedure. A total of Versed 1.0 mg and Fentanyl 50 mcg was administered intravenously by the radiology nurse. Total intra-service moderate Sedation Time: 39 minutes. The patient's level of consciousness and vital signs were monitored continuously by radiology nursing throughout the procedure under my direct supervision. CONTRAST:  50 cc FLUOROSCOPY: Radiation Exposure Index (as provided by the fluoroscopic device): 9 minutes, 604 mGy Kerma COMPLICATIONS: None PROCEDURE: Informed consent was obtained from the patient following explanation of the procedure, risks, benefits and alternatives. The patient understands, agrees and consents for the procedure. All questions were addressed. A time out was performed prior to the initiation of the procedure. Maximal barrier sterile technique utilized including caps, mask, sterile gowns, sterile gloves, large sterile drape, hand hygiene, and Betadine prep. Ultrasound survey of the right inguinal region was performed with images stored and sent to PACs, confirming patency of the vessel. A micropuncture needle was  used access the right common femoral artery under ultrasound. With excellent arterial blood flow returned, and an .018 micro wire was passed through the needle, observed enter the abdominal aorta under fluoroscopy. The  needle was removed, and a micropuncture sheath was placed over the wire. The inner dilator and wire were removed, and an 035 Bentson wire was advanced under fluoroscopy into the abdominal aorta. The sheath was removed and a standard 5 [place] vascular sheath was placed. The dilator was removed and the sheath was flushed. C2 cobra catheter was advanced into the abdominal aorta and used to select the SMA. Angiogram was performed. The catheter was un stable at the origin of the SMA, and attempt at advancing distally with the Glidewire was unsuccessful. The catheter was exchanged for a Mickelson catheter. Tip of the Mickelson catheter were used to select the SMA origin. With a stable catheter position, a microcatheter coaxial system was advanced within 025 penumbra lantern with 014 fathom wire. Catheter was placed into the right colic artery. Angiogram was performed. Catheter was then advanced into the descending branch of the ileocolic artery. Angiogram was performed. Microcatheter was addended advanced into the marginal artery, that were supplying the Vasa recta in the region of interest. The catheter would not advance into the specific Vasa recta of interest, and as such the origin of the Vasa recta near branch point of the marginal artery were coil embolized with a single 2 mm coil and a single 1 mm coil. Catheter was withdrawn and angiogram was performed. At the conclusion, there was no further hemorrhage identified. All catheters wires were withdrawn. A Celt was deployed for hemostasis. Patient tolerated the procedure well and remained hemodynamically stable throughout. No complications were encountered and no significant blood loss. IMPRESSION: Status post ultrasound guided access right common femoral artery, with mesenteric angiogram targeting the SMA and right colic artery, with super selective catheter position into the contributing Vasa recta of acute right colon diverticular hemorrhage, with coil embolization  to stasis. Celt deployed for hemostasis. Signed, Dulcy Fanny. Nadene Rubins, RPVI [facility] Radiology Electronically Signed   By: Corrie Mckusick D.O.   On: 02/27/2022 08:48   IR Angiogram Selective Each Additional Vessel  Result Date: 02/27/2022 INDICATION: 87 year old female with acute right-sided GI hemorrhage EXAM: ULTRASOUND-GUIDED ACCESS RIGHT COMMON FEMORAL ARTERY MESENTERIC ANGIOGRAM SUPER SELECTIVE CATHETER POSITION TO THE VASA RECTA OF THE CECUM EMBOLIZATION OF THE VASA RECTA FOR EMERGENT GI HEMORRHAGE CELT FOR HEMOSTASIS MEDICATIONS: None ANESTHESIA/SEDATION: Moderate (conscious) sedation was employed during this procedure. A total of Versed 1.0 mg and Fentanyl 50 mcg was administered intravenously by the radiology nurse. Total intra-service moderate Sedation Time: 39 minutes. The patient's level of consciousness and vital signs were monitored continuously by radiology nursing throughout the procedure under my direct supervision. CONTRAST:  50 cc FLUOROSCOPY: Radiation Exposure Index (as provided by the fluoroscopic device): 9 minutes, 607 mGy Kerma COMPLICATIONS: None PROCEDURE: Informed consent was obtained from the patient following explanation of the procedure, risks, benefits and alternatives. The patient understands, agrees and consents for the procedure. All questions were addressed. A time out was performed prior to the initiation of the procedure. Maximal barrier sterile technique utilized including caps, mask, sterile gowns, sterile gloves, large sterile drape, hand hygiene, and Betadine prep. Ultrasound survey of the right inguinal region was performed with images stored and sent to PACs, confirming patency of the vessel. A micropuncture needle was used access the right common femoral artery under ultrasound. With  excellent arterial blood flow returned, and an .018 micro wire was passed through the needle, observed enter the abdominal aorta  under fluoroscopy. The needle was removed, and a micropuncture sheath was placed over the wire. The inner dilator and wire were removed, and an 035 Bentson wire was advanced under fluoroscopy into the abdominal aorta. The sheath was removed and a standard 5 [place] vascular sheath was placed. The dilator was removed and the sheath was flushed. C2 cobra catheter was advanced into the abdominal aorta and used to select the SMA. Angiogram was performed. The catheter was un stable at the origin of the SMA, and attempt at advancing distally with the Glidewire was unsuccessful. The catheter was exchanged for a Mickelson catheter. Tip of the Mickelson catheter were used to select the SMA origin. With a stable catheter position, a microcatheter coaxial system was advanced within 025 penumbra lantern with 014 fathom wire. Catheter was placed into the right colic artery. Angiogram was performed. Catheter was then advanced into the descending branch of the ileocolic artery. Angiogram was performed. Microcatheter was addended advanced into the marginal artery, that were supplying the Vasa recta in the region of interest. The catheter would not advance into the specific Vasa recta of interest, and as such the origin of the Vasa recta near branch point of the marginal artery were coil embolized with a single 2 mm coil and a single 1 mm coil. Catheter was withdrawn and angiogram was performed. At the conclusion, there was no further hemorrhage identified. All catheters wires were withdrawn. A Celt was deployed for hemostasis. Patient tolerated the procedure well and remained hemodynamically stable throughout. No complications were encountered and no significant blood loss. IMPRESSION: Status post ultrasound guided access right common femoral artery, with mesenteric angiogram targeting the SMA and right colic artery, with super selective catheter position into the contributing Vasa recta of acute right colon diverticular hemorrhage,  with coil embolization to stasis. Celt deployed for hemostasis. Signed, Dulcy Fanny. Nadene Rubins, RPVI [facility] Radiology Electronically Signed   By: Corrie Mckusick D.O.   On: 02/27/2022 08:48   IR Angiogram Selective Each Additional Vessel  Result Date: 02/27/2022 INDICATION: 87 year old female with acute right-sided GI hemorrhage EXAM: ULTRASOUND-GUIDED ACCESS RIGHT COMMON FEMORAL ARTERY MESENTERIC ANGIOGRAM SUPER SELECTIVE CATHETER POSITION TO THE VASA RECTA OF THE CECUM EMBOLIZATION OF THE VASA RECTA FOR EMERGENT GI HEMORRHAGE CELT FOR HEMOSTASIS MEDICATIONS: None ANESTHESIA/SEDATION: Moderate (conscious) sedation was employed during this procedure. A total of Versed 1.0 mg and Fentanyl 50 mcg was administered intravenously by the radiology nurse. Total intra-service moderate Sedation Time: 39 minutes. The patient's level of consciousness and vital signs were monitored continuously by radiology nursing throughout the procedure under my direct supervision. CONTRAST:  50 cc FLUOROSCOPY: Radiation Exposure Index (as provided by the fluoroscopic device): 9 minutes, 275 mGy Kerma COMPLICATIONS: None PROCEDURE: Informed consent was obtained from the patient following explanation of the procedure, risks, benefits and alternatives. The patient understands, agrees and consents for the procedure. All questions were addressed. A time out was performed prior to the initiation of the procedure. Maximal barrier sterile technique utilized including caps, mask, sterile gowns, sterile gloves, large sterile drape, hand hygiene, and Betadine prep. Ultrasound survey of the right inguinal region was performed with images stored and sent to PACs, confirming patency of the vessel. A micropuncture needle was used access the right common femoral artery under ultrasound. With excellent arterial blood flow returned, and an .018 micro wire  was passed through the needle, observed enter  the abdominal aorta under fluoroscopy. The needle was removed, and a micropuncture sheath was placed over the wire. The inner dilator and wire were removed, and an 035 Bentson wire was advanced under fluoroscopy into the abdominal aorta. The sheath was removed and a standard 5 [place] vascular sheath was placed. The dilator was removed and the sheath was flushed. C2 cobra catheter was advanced into the abdominal aorta and used to select the SMA. Angiogram was performed. The catheter was un stable at the origin of the SMA, and attempt at advancing distally with the Glidewire was unsuccessful. The catheter was exchanged for a Mickelson catheter. Tip of the Mickelson catheter were used to select the SMA origin. With a stable catheter position, a microcatheter coaxial system was advanced within 025 penumbra lantern with 014 fathom wire. Catheter was placed into the right colic artery. Angiogram was performed. Catheter was then advanced into the descending branch of the ileocolic artery. Angiogram was performed. Microcatheter was addended advanced into the marginal artery, that were supplying the Vasa recta in the region of interest. The catheter would not advance into the specific Vasa recta of interest, and as such the origin of the Vasa recta near branch point of the marginal artery were coil embolized with a single 2 mm coil and a single 1 mm coil. Catheter was withdrawn and angiogram was performed. At the conclusion, there was no further hemorrhage identified. All catheters wires were withdrawn. A Celt was deployed for hemostasis. Patient tolerated the procedure well and remained hemodynamically stable throughout. No complications were encountered and no significant blood loss. IMPRESSION: Status post ultrasound guided access right common femoral artery, with mesenteric angiogram targeting the SMA and right colic artery, with super selective catheter position into the contributing Vasa recta of acute right colon  diverticular hemorrhage, with coil embolization to stasis. Celt deployed for hemostasis. Signed, Dulcy Fanny. Nadene Rubins, RPVI [facility] Radiology Electronically Signed   By: Corrie Mckusick D.O.   On: 02/27/2022 08:48   IR Angiogram Selective Each Additional Vessel  Result Date: 02/27/2022 INDICATION: 87 year old female with acute right-sided GI hemorrhage EXAM: ULTRASOUND-GUIDED ACCESS RIGHT COMMON FEMORAL ARTERY MESENTERIC ANGIOGRAM SUPER SELECTIVE CATHETER POSITION TO THE VASA RECTA OF THE CECUM EMBOLIZATION OF THE VASA RECTA FOR EMERGENT GI HEMORRHAGE CELT FOR HEMOSTASIS MEDICATIONS: None ANESTHESIA/SEDATION: Moderate (conscious) sedation was employed during this procedure. A total of Versed 1.0 mg and Fentanyl 50 mcg was administered intravenously by the radiology nurse. Total intra-service moderate Sedation Time: 39 minutes. The patient's level of consciousness and vital signs were monitored continuously by radiology nursing throughout the procedure under my direct supervision. CONTRAST:  50 cc FLUOROSCOPY: Radiation Exposure Index (as provided by the fluoroscopic device): 9 minutes, 353 mGy Kerma COMPLICATIONS: None PROCEDURE: Informed consent was obtained from the patient following explanation of the procedure, risks, benefits and alternatives. The patient understands, agrees and consents for the procedure. All questions were addressed. A time out was performed prior to the initiation of the procedure. Maximal barrier sterile technique utilized including caps, mask, sterile gowns, sterile gloves, large sterile drape, hand hygiene, and Betadine prep. Ultrasound survey of the right inguinal region was performed with images stored and sent to PACs, confirming patency of the vessel. A micropuncture needle was used access the right common femoral artery under ultrasound. With excellent arterial blood flow returned, and an .018 micro wire was passed through  the needle, observed enter the abdominal  aorta under fluoroscopy. The needle was removed, and a micropuncture sheath was placed over the wire. The inner dilator and wire were removed, and an 035 Bentson wire was advanced under fluoroscopy into the abdominal aorta. The sheath was removed and a standard 5 [place] vascular sheath was placed. The dilator was removed and the sheath was flushed. C2 cobra catheter was advanced into the abdominal aorta and used to select the SMA. Angiogram was performed. The catheter was un stable at the origin of the SMA, and attempt at advancing distally with the Glidewire was unsuccessful. The catheter was exchanged for a Mickelson catheter. Tip of the Mickelson catheter were used to select the SMA origin. With a stable catheter position, a microcatheter coaxial system was advanced within 025 penumbra lantern with 014 fathom wire. Catheter was placed into the right colic artery. Angiogram was performed. Catheter was then advanced into the descending branch of the ileocolic artery. Angiogram was performed. Microcatheter was addended advanced into the marginal artery, that were supplying the Vasa recta in the region of interest. The catheter would not advance into the specific Vasa recta of interest, and as such the origin of the Vasa recta near branch point of the marginal artery were coil embolized with a single 2 mm coil and a single 1 mm coil. Catheter was withdrawn and angiogram was performed. At the conclusion, there was no further hemorrhage identified. All catheters wires were withdrawn. A Celt was deployed for hemostasis. Patient tolerated the procedure well and remained hemodynamically stable throughout. No complications were encountered and no significant blood loss. IMPRESSION: Status post ultrasound guided access right common femoral artery, with mesenteric angiogram targeting the SMA and right colic artery, with super selective catheter position into the contributing Vasa  recta of acute right colon diverticular hemorrhage, with coil embolization to stasis. Celt deployed for hemostasis. Signed, Dulcy Fanny. Nadene Rubins, RPVI [facility] Radiology Electronically Signed   By: Corrie Mckusick D.O.   On: 02/27/2022 08:48   ECHOCARDIOGRAM COMPLETE  Result Date: 02/26/2022    ECHOCARDIOGRAM REPORT   Patient Name:   RAYLIE MADDISON Date of Exam: 02/26/2022 Medical Rec #:  [id number]      Height:       67.0 in Accession #:    [id number]     Weight:       189.0 lb Date of Birth:  18-Dec-1934      BSA:          1.974 m Patient Age:    53 years       BP:           134/57 mmHg Patient Gender: F              HR:           72 bpm. Exam Location:  Inpatient Procedure: 2D Echo Indications:    bradycardia  History:        Patient has no prior history of Echocardiogram examinations.                 Arrythmias:Bradycardia; Risk Factors:Hypertension, Diabetes and                 Dyslipidemia.  Sonographer:    Harvie Junior Referring Phys: [id number] [facility]  Sonographer Comments: Technically difficult study due to poor echo windows and patient is obese. Image acquisition challenging due to patient body habitus and supine. IMPRESSIONS  1. Left ventricular ejection fraction, by estimation, is 60  to 65%. The left ventricle has normal function. The left ventricle has no regional wall motion abnormalities. There is mild left ventricular hypertrophy. Left ventricular diastolic parameters are consistent with Grade I diastolic dysfunction (impaired relaxation).  2. Right ventricular systolic function is normal. The right ventricular size is normal. There is normal pulmonary artery systolic pressure. The estimated right ventricular systolic pressure is 24.8 mmHg.  3. The mitral valve is degenerative. No evidence of mitral valve regurgitation. Moderate mitral annular calcification.  4. The aortic valve was not well visualized. Aortic valve regurgitation is not  visualized. No aortic stenosis is present. FINDINGS  Left Ventricle: Left ventricular ejection fraction, by estimation, is 60 to 65%. The left ventricle has normal function. The left ventricle has no regional wall motion abnormalities. The left ventricular internal cavity size was normal in size. There is  mild left ventricular hypertrophy. Left ventricular diastolic parameters are consistent with Grade I diastolic dysfunction (impaired relaxation). Right Ventricle: The right ventricular size is normal. No increase in right ventricular wall thickness. Right ventricular systolic function is normal. There is normal pulmonary artery systolic pressure. The tricuspid regurgitant velocity is 2.41 m/s, and  with an assumed right atrial pressure of 3 mmHg, the estimated right ventricular systolic pressure is 25.0 mmHg. Left Atrium: Left atrial size was normal in size. Right Atrium: Right atrial size was normal in size. Pericardium: There is no evidence of pericardial effusion. Mitral Valve: The mitral valve is degenerative in appearance. Moderate mitral annular calcification. No evidence of mitral valve regurgitation. Tricuspid Valve: The tricuspid valve is normal in structure. Tricuspid valve regurgitation is trivial. Aortic Valve: The aortic valve was not well visualized. Aortic valve regurgitation is not visualized. No aortic stenosis is present. Aortic valve mean gradient measures 4.0 mmHg. Aortic valve peak gradient measures 7.6 mmHg. Aortic valve area, by VTI measures 2.10 cm. Pulmonic Valve: The pulmonic valve was not well visualized. Pulmonic valve regurgitation is not visualized. Aorta: The aortic root is normal in size and structure. IAS/Shunts: The interatrial septum was not well visualized.  LEFT VENTRICLE PLAX 2D LVIDd:         4.60 cm     Diastology LVIDs:         3.40 cm     LV e' medial:    5.77 cm/s LV PW:         1.20 cm     LV E/e' medial:  15.6 LV IVS:        1.10 cm     LV e' lateral:   8.81 cm/s LVOT  diam:     1.70 cm     LV E/e' lateral: 10.2 LV SV:         56 LV SV Index:   28 LVOT Area:     2.27 cm  LV Volumes (MOD) LV vol d, MOD A2C: 66.7 ml LV vol d, MOD A4C: 83.5 ml LV vol s, MOD A2C: 25.3 ml LV vol s, MOD A4C: 26.7 ml LV SV MOD A2C:     41.4 ml LV SV MOD A4C:     83.5 ml LV SV MOD BP:      49.7 ml RIGHT VENTRICLE RV Basal diam:  3.50 cm RV Mid diam:    3.20 cm RV S prime:     22.80 cm/s TAPSE (M-mode): 3.0 cm LEFT ATRIUM             Index        RIGHT ATRIUM  Index LA diam:        3.80 cm 1.92 cm/m   RA Area:     12.30 cm LA Vol (A2C):   40.1 ml 20.31 ml/m  RA Volume:   26.30 ml  13.32 ml/m LA Vol (A4C):   37.1 ml 18.79 ml/m LA Biplane Vol: 40.6 ml 20.57 ml/m  AORTIC VALVE                     PULMONIC VALVE AV Area (Vmax):    2.01 cm      PV Vmax:       1.76 m/s AV Area (Vmean):   1.67 cm      PV Peak grad:  12.4 mmHg AV Area (VTI):     2.10 cm AV Vmax:           138.00 cm/s AV Vmean:          102.000 cm/s AV VTI:            0.265 m AV Peak Grad:      7.6 mmHg AV Mean Grad:      4.0 mmHg LVOT Vmax:         122.00 cm/s LVOT Vmean:        75.000 cm/s LVOT VTI:          0.245 m LVOT/AV VTI ratio: 0.92  AORTA Ao Root diam: 3.10 cm MITRAL VALVE                TRICUSPID VALVE MV Area (PHT): 2.99 cm     TR Peak grad:   23.2 mmHg MV Decel Time: 254 msec     TR Vmax:        241.00 cm/s MV E velocity: 90.00 cm/s MV A velocity: 112.00 cm/s  SHUNTS MV E/A ratio:  0.80         Systemic VTI:  0.24 m                             Systemic Diam: 1.70 cm Oswaldo Milian MD Electronically signed by Oswaldo Milian MD Signature Date/Time: 02/26/2022/11:04:31 AM    Final    CT Angio Abd/Pel W and/or Wo Contrast  Result Date: 02/25/2022 CLINICAL DATA:  Bright red blood per rectum. History of gastrointestinal hemorrhage. EXAM: CTA ABDOMEN AND PELVIS WITHOUT AND WITH CONTRAST TECHNIQUE: Multidetector CT imaging of the abdomen and pelvis was performed using the standard protocol during bolus  administration of intravenous contrast. Multiplanar reconstructed images and MIPs were obtained and reviewed to evaluate the vascular anatomy. RADIATION DOSE REDUCTION: This exam was performed according to the departmental dose-optimization program which includes automated exposure control, adjustment of the mA and/or kV according to patient size and/or use of iterative reconstruction technique. CONTRAST:  16m OMNIPAQUE IOHEXOL 350 MG/ML SOLN COMPARISON:  CT 01/23/2015 FINDINGS: VASCULAR Aorta: Normal caliber aorta without aneurysm, dissection, vasculitis or significant stenosis. Mild intimal calcification aorta is branches Celiac: Patent without evidence of aneurysm, dissection, vasculitis or significant stenosis. SMA: Patent without evidence of aneurysm, dissection, vasculitis or significant stenosis. Renals: Both renal arteries are patent without evidence of aneurysm, dissection, vasculitis, fibromuscular dysplasia or significant stenosis. Small accessory renal arteries on LEFT and RIGHT. IMA: Patent without evidence of aneurysm, dissection, vasculitis or significant stenosis. Inflow: Patent without evidence of aneurysm, dissection, vasculitis or significant stenosis. Proximal Outflow: Bilateral common femoral and visualized portions of the superficial and profunda femoral arteries are patent without  evidence of aneurysm, dissection, vasculitis or significant stenosis. Veins: No obvious venous abnormality within the limitations of this arterial phase study. Review of the MIP images confirms the above findings. NON-VASCULAR Lower chest: Lung bases are clear. Hepatobiliary: No focal hepatic lesion. No biliary duct dilatation. Common bile duct is normal. Pancreas: Pancreas is normal. No ductal dilatation. No pancreatic inflammation. Spleen: Normal spleen Adrenals/urinary tract: Adrenal glands and kidneys are normal. The ureters and bladder normal. Stomach/Bowel: No evidence of active gastrointestinal bleeding  within the stomach, duodenum or small bowel. The terminal ileum is normal. Within the midportion of the ascending colon there is a focus of high-density contrast within the dependent lumen of the ascending colon (image 108/series 9) which is not present on the noncontrast CT series. On the more delayed venous phase imaging, this intraluminal contrast increases and pools dependently and spreads antegrade. No evidence of additional active hemorrhage within the LEFT colon or rectosigmoid colon. Multiple diverticula through the colon. Vascular/Lymphatic: No periportal or retroperitoneal adenopathy. No pelvic adenopathy. Reproductive: Post hysterectomy.  Adnexa unremarkable Other: No free fluid. Musculoskeletal: No aggressive osseous lesion. IMPRESSION: VASCULAR 1. Active gastrointestinal bleeding in the ascending colon. 2.  Aortic Atherosclerosis (ICD10-I70.0). NON-VASCULAR 1. Mild pan diverticulosis.  No evidence acute diverticulitis. Findings conveyed toSILAS WONG on 02/25/2022  at09:28. Electronically Signed   By: Suzy Bouchard M.D.   On: 02/25/2022 09:29    Labs:  CBC: Recent Labs    02/26/22 0230 02/26/22 0825 02/27/22 0426 02/28/22 1455  WBC 10.7* 11.4* 9.0 10.8*  HGB 10.9* 10.2* 8.3* 9.1*  HCT 32.1* 30.6* 25.5* 27.2*  PLT 209 214 174 198    COAGS: Recent Labs    02/25/22 0654  INR 1.2    BMP: Recent Labs    02/25/22 0654 02/26/22 0230 02/27/22 0426  NA 140 142 141  K 3.3* 3.6 3.5  CL 109 108 107  CO2 '23 24 27  '$ GLUCOSE 133* 158* 117*  BUN '16 12 13  '$ CALCIUM 9.2 8.8* 8.6*  CREATININE 1.09* 1.00 0.91  GFRNONAA 50* 55* >60    LIVER FUNCTION TESTS: Recent Labs    02/25/22 0654  BILITOT 0.6  AST 22  ALT 20  ALKPHOS 67  PROT 7.2  ALBUMIN 3.9    Assessment and Plan:  GI Bleed s/p mesenteric angiogram with coil embolization 02/25/22  Hemoglobin is up to 9.1 today. Patient did have a bowel movement today with some dark red blood in it, likely old blood. She denies  pain or discomfort. Vital signs are within range. Right groin vascular site is clean, soft, dry and non-tender.    Please call IR with any questions.   Electronically Signed: Soyla Dryer, AGACNP-BC [phone] 02/28/2022, 3:36 PM   I spent a total of 15 Minutes at the the patient's bedside AND on the patient's hospital floor or unit, greater than 50% of which was counseling/coordinating care for GIB s/p embolization

## 2022-02-28 NOTE — Plan of Care (Signed)
  Problem: Education: Goal: Ability to describe self-care measures that may prevent or decrease complications (Diabetes Survival Skills Education) will improve Outcome: Progressing Goal: Individualized Educational Video(s) Outcome: Progressing   

## 2022-02-28 NOTE — Care Management Important Message (Signed)
Important Message  Patient Details  Name: Kaitlin Gomez MRN: [id number] Date of Birth: [birth year]   Medicare Important Message Given:  Yes     Logyn Dedominicis 02/28/2022, 3:12 PM

## 2022-02-28 NOTE — Progress Notes (Signed)
Mobility Specialist Progress Note:   02/28/22 1628  Mobility  Activity Ambulated with assistance in hallway  Activity Response Tolerated well  Distance Ambulated (ft) 175 ft  $Mobility charge 1 Mobility  Level of Assistance Contact guard assist, steadying assist  Assistive Device Front wheel walker  Mobility Referral Yes   Pt received in bed and agreeable. No complaints. Pt left in bed with all needs met and call bell in reach.   Darbi Chandran Mobility Specialist-Acute Rehab Secure Chat only

## 2022-03-01 ENCOUNTER — Other Ambulatory Visit (HOSPITAL_COMMUNITY): Payer: Medicare HMO

## 2022-03-01 DIAGNOSIS — R410 Disorientation, unspecified: Secondary | ICD-10-CM

## 2022-03-01 DIAGNOSIS — K922 Gastrointestinal hemorrhage, unspecified: Secondary | ICD-10-CM | POA: Diagnosis not present

## 2022-03-01 LAB — CBC
HCT: 27.2 % — ABNORMAL LOW (ref 36.0–46.0)
Hemoglobin: 8.9 g/dL — ABNORMAL LOW (ref 12.0–15.0)
MCH: 31 pg (ref 26.0–34.0)
MCHC: 32.7 g/dL (ref 30.0–36.0)
MCV: 94.8 fL (ref 80.0–100.0)
Platelets: 234 10*3/uL (ref 150–400)
RBC: 2.87 MIL/uL — ABNORMAL LOW (ref 3.87–5.11)
RDW: 14.3 % (ref 11.5–15.5)
WBC: 8.8 10*3/uL (ref 4.0–10.5)
nRBC: 0.5 % — ABNORMAL HIGH (ref 0.0–0.2)

## 2022-03-01 LAB — URINALYSIS, COMPLETE (UACMP) WITH MICROSCOPIC
Bacteria, UA: NONE SEEN
Bilirubin Urine: NEGATIVE
Glucose, UA: NEGATIVE mg/dL
Hgb urine dipstick: NEGATIVE
Ketones, ur: 20 mg/dL — AB
Leukocytes,Ua: NEGATIVE
Nitrite: NEGATIVE
Protein, ur: NEGATIVE mg/dL
Specific Gravity, Urine: 1.012 (ref 1.005–1.030)
pH: 8 (ref 5.0–8.0)

## 2022-03-01 LAB — GLUCOSE, CAPILLARY
Glucose-Capillary: 142 mg/dL — ABNORMAL HIGH (ref 70–99)
Glucose-Capillary: 147 mg/dL — ABNORMAL HIGH (ref 70–99)
Glucose-Capillary: 143 mg/dL — ABNORMAL HIGH (ref 70–99)
Glucose-Capillary: 168 mg/dL — ABNORMAL HIGH (ref 70–99)

## 2022-03-01 MED ORDER — HALOPERIDOL LACTATE 5 MG/ML IJ SOLN
2.0000 mg | Freq: Four times a day (QID) | INTRAMUSCULAR | Status: DC | PRN
  Administered 2022-03-01 (×2): 2 mg via INTRAVENOUS
  Filled 2022-03-01: qty 1

## 2022-03-01 MED ORDER — HALOPERIDOL LACTATE 5 MG/ML IJ SOLN
2.0000 mg | Freq: Once | INTRAMUSCULAR | Status: AC
  Administered 2022-03-01: 2 mg via INTRAMUSCULAR
  Filled 2022-03-01: qty 1

## 2022-03-01 MED ORDER — HALOPERIDOL LACTATE 5 MG/ML IJ SOLN
INTRAMUSCULAR | Status: AC
  Filled 2022-03-01: qty 1

## 2022-03-01 NOTE — Progress Notes (Signed)
Progress Note  Patient: Kaitlin Gomez FTD:[id number] DOB: [birth year]  DOA: 02/25/2022  DOS: 03/01/2022    Brief hospital course:  Kaitlin Gomez is an 87 y.o. female with a history of Crohn's disease s/p sigmoid colectomy, colovesical fistula repair 1995 at [facility], T2DM, HTN, HLD who presented to [facility] with several episodes of hematochezia found to have active bleeding in the ascending colon by CT angiography, transferred to [facility] for IR evaluation and subsequently underwent angiography with coil embolization. Hemoglobin 14.3 on arrival, now down to 8.3g/dl.  Assessment and Plan:  Diverticular bleed in ascending colon: -  now s/p coil embolization by IR Dr. Earleen Newport 10/1. Previous colonoscopy showed diverticulosis, suspected cause of painless hematochezia despite hx IBD.  - Monitor H/H as below, so far hemoglobin remained stable last BM yesterday morning with small amount of what appears to be old blood. - Do not submerge R CFA access site for 7 days. No longer requires any bedrest.   ABLA due to GI bleed:  -Overall hemoglobin has been stable over last 48 hours, continue to monitor closely and transfuse as needed.   - Continue holding ASA    Hospital delirium -Significant confusion overnight, appears to be in hospital delirium, started on as needed Haldol, minimize narcotics and sedative medications, courage sleep/wake pattern, just staff to open blinds during daytime.  Sinus bradycardia, 1st degree AVB: Improved when beta blocker held. TSH 0.811. Limited echo showed preserved LVEF and no wall motion abnormalities.  - To help with BP control, home atenolol restarted, modest bradycardia which is asymptomatic is noted.   Hypokalemia: Resolved.   Crohn's disease: No evidence of flare at this time.  - Suggest she reengage with GI as outpatient, hasn't been seen since 2018.   T2DM: Well-controlled with HbA1c 6.3%.  - Hold metformin with recent contrast administration (bicarb wnl at 24) - Sensitive  SSI  Resistant HTN:  - Continue home medications.  - Restart home dose clonidine.  - Continue norvasc - Restart lasix, ACEi as Cr is stable, wnl, legs are edematous, and BP still elevated  HLD:  - Continue statin  Subjective:      Is unable to provide any reliable complaints, she has been more confused overnight, rapid response was called earlier this morning due to increased confusion, she has no focal deficits.  Objective: Vitals:   03/01/22 0743 03/01/22 1002 03/01/22 1100 03/01/22 1538  BP: (!) 142/51 (!) 168/73 (!) 189/66 (!) 147/89  Pulse: 72  99 (!) 101  Resp: '19 18 16 20  '$ Temp: 99.1 F (37.3 C)  99.2 F (37.3 C)   TempSrc: Oral  Axillary Axillary  SpO2: 98% 98% 99%    Gen: 87 y.o. female in no distress Is more confused, restless today, does not follow any commands or answer any questions appropriately Symmetrical Chest wall movement, Good air movement bilaterally, CTAB RRR,No Gallops,Rubs or new Murmurs, No Parasternal Heave +ve B.Sounds, Abd Soft, No tenderness, No rebound - guarding or rigidity. No Cyanosis, Clubbing or edema, No new Rash or bruise      Data Personally reviewed: CBC: Recent Labs  Lab 02/26/22 0230 02/26/22 0825 02/27/22 0426 02/28/22 1455 03/01/22 0405  WBC 10.7* 11.4* 9.0 10.8* 8.8  NEUTROABS  --  9.2*  --   --   --   HGB 10.9* 10.2* 8.3* 9.1* 8.9*  HCT 32.1* 30.6* 25.5* 27.2* 27.2*  MCV 92.5 93.3 93.4 91.9 94.8  PLT 209 214 174 198 062   Basic Metabolic Panel: Recent  Labs  Lab 02/25/22 0654 02/25/22 1425 02/26/22 0230 02/27/22 0426  NA 140  --  142 141  K 3.3*  --  3.6 3.5  CL 109  --  108 107  CO2 23  --  24 27  GLUCOSE 133*  --  158* 117*  BUN 16  --  12 13  CREATININE 1.09*  --  1.00 0.91  CALCIUM 9.2  --  8.8* 8.6*  MG  --  1.5*  --   --    GFR: Estimated Creatinine Clearance: 50.8 mL/min (by C-G formula based on SCr of 0.91 mg/dL). Liver Function Tests: Recent Labs  Lab 02/25/22 0654  AST 22  ALT 20   ALKPHOS 67  BILITOT 0.6  PROT 7.2  ALBUMIN 3.9   No results for input(s): "LIPASE", "AMYLASE" in the last 168 hours. No results for input(s): "AMMONIA" in the last 168 hours. Coagulation Profile: Recent Labs  Lab 02/25/22 0654  INR 1.2   Cardiac Enzymes: No results for input(s): "CKTOTAL", "CKMB", "CKMBINDEX", "TROPONINI" in the last 168 hours. BNP (last 3 results) No results for input(s): "PROBNP" in the last 8760 hours. HbA1C: No results for input(s): "HGBA1C" in the last 72 hours.  CBG: Recent Labs  Lab 02/28/22 1550 02/28/22 1946 03/01/22 0741 03/01/22 1145 03/01/22 1539  GLUCAP 111* 136* 142* 147* 143*   Lipid Profile: No results for input(s): "CHOL", "HDL", "LDLCALC", "TRIG", "CHOLHDL", "LDLDIRECT" in the last 72 hours. Thyroid Function Tests: No results for input(s): "TSH", "T4TOTAL", "FREET4", "T3FREE", "THYROIDAB" in the last 72 hours.  Anemia Panel: No results for input(s): "VITAMINB12", "FOLATE", "FERRITIN", "TIBC", "IRON", "RETICCTPCT" in the last 72 hours. Urine analysis:    Component Value Date/Time   COLORURINE STRAW (A) 01/23/2015 0802   APPEARANCEUR CLEAR (A) 01/23/2015 0802   APPEARANCEUR Cloudy 10/27/2011 1403   LABSPEC 1.006 01/23/2015 0802   LABSPEC 1.017 10/27/2011 1403   PHURINE 6.0 01/23/2015 0802   GLUCOSEU NEGATIVE 01/23/2015 0802   GLUCOSEU Negative 10/27/2011 1403   HGBUR NEGATIVE 01/23/2015 0802   BILIRUBINUR NEGATIVE 01/23/2015 0802   BILIRUBINUR Negative 10/27/2011 1403   KETONESUR NEGATIVE 01/23/2015 0802   PROTEINUR NEGATIVE 01/23/2015 0802   NITRITE NEGATIVE 01/23/2015 0802   LEUKOCYTESUR TRACE (A) 01/23/2015 0802   LEUKOCYTESUR 3+ 10/27/2011 1403   No results found for this or any previous visit (from the past 240 hour(s)).   No results found.  Family Communication: None at bedside, left voicemail for friend Sammuel Hines  Disposition: Status is: Inpatient Remains inpatient appropriate because: Continued monitoring  for blood loss anemia  Planned Discharge Destination: Home  Phillips Climes, MD 03/01/2022 4:11 PM Page by [email or web address]

## 2022-03-01 NOTE — Progress Notes (Addendum)
At 0800 patient was alert and oriented and following commands.  Sitting up in bed eating breakfast.   At approx 0905 went in pt room to get her to bedside commode and with assistance from April, CNA we Were able to transfer patient.  As pt was sitting on bsc she became disoriented and not able to follow  Commands.  At 0910 is when I called Rapid Response RN.     Rapid Lucienne Minks, RN notified patient not following commands and stuttering, not able to answer questions.  Pt lying in bed, alert. Not following commands. Disoriented, restless. Pt moving all extremities equally. Face symmetrical. Speech is clear. Extremities are warm, dry, pink. Respiratory rate is regular, unlabored. Mild tachypnea, due to restlessness.    VS: BP 168/73, HR 91, RR 18, SpO2 98% on room air CBG: 142   Dr. Waldron Labs responded and we gave patient '2mg'$  Haldol IM per Dr. Waldron Labs due to patient lost IV access. Patient calmed down and was resting quietly.

## 2022-03-01 NOTE — Progress Notes (Signed)
PT Cancellation Note  Patient Details Name: Kaitlin Gomez MRN: [id number] DOB: 1934/06/22   Cancelled Treatment:    Reason Eval/Treat Not Completed: (P) Other (comment) (AM - defer due to pt confusion and Rapid Response; PM - RN giving bed bath, pt not ready and remains confused/agitated per RN.) Will continue efforts next date as schedule permits.   Kara Pacer Mariel Lukins 03/01/2022, 6:14 PM

## 2022-03-01 NOTE — Plan of Care (Signed)
  Problem: Education: Goal: Ability to describe self-care measures that may prevent or decrease complications (Diabetes Survival Skills Education) will improve Outcome: Progressing Goal: Individualized Educational Video(s) Outcome: Progressing   

## 2022-03-01 NOTE — Significant Event (Addendum)
Rapid Response Event Note   Reason for Call :  Altered mental status  Initial Focused Assessment:  Pt lying in bed, alert. Not following commands. Disoriented, restless. Pt moving all extremities equally. Face symmetrical. Speech is clear. Extremities are warm, dry, pink. Respiratory rate is regular, unlabored. Mild tachypnea, due to restlessness.   VS: BP 168/73, HR 91, RR 18, SpO2 98% on room air CBG: 142  Interventions:  -Haldol per Dr. Waldron Labs -Current PIV leaking- IV removed and attempted to place IV x1 without success- IV team consult placed.  Plan of Care:  -IV team to place PIV -Delirium precautions- encourage sleep/wake pattern, uninterrupted and quiet sleep environment at night, blinds open during the day   Call rapid response for additional needs Event Summary:  MD Notified: Dr. Waldron Labs at bedside Call Time: [place] Time: 0915 End Time: [place], RN

## 2022-03-02 ENCOUNTER — Inpatient Hospital Stay (HOSPITAL_COMMUNITY): Payer: Medicare HMO

## 2022-03-02 DIAGNOSIS — R4182 Altered mental status, unspecified: Secondary | ICD-10-CM

## 2022-03-02 DIAGNOSIS — N179 Acute kidney failure, unspecified: Secondary | ICD-10-CM

## 2022-03-02 DIAGNOSIS — K922 Gastrointestinal hemorrhage, unspecified: Secondary | ICD-10-CM | POA: Diagnosis not present

## 2022-03-02 DIAGNOSIS — I10 Essential (primary) hypertension: Secondary | ICD-10-CM | POA: Diagnosis not present

## 2022-03-02 LAB — BASIC METABOLIC PANEL
Anion gap: 14 (ref 5–15)
BUN: 19 mg/dL (ref 8–23)
CO2: 21 mmol/L — ABNORMAL LOW (ref 22–32)
Calcium: 8.9 mg/dL (ref 8.9–10.3)
Chloride: 102 mmol/L (ref 98–111)
Creatinine, Ser: 1.38 mg/dL — ABNORMAL HIGH (ref 0.44–1.00)
GFR, Estimated: 38 mL/min — ABNORMAL LOW (ref >60)
Glucose, Bld: 138 mg/dL — ABNORMAL HIGH (ref 70–99)
Potassium: 3.1 mmol/L — ABNORMAL LOW (ref 3.5–5.1)
Sodium: 137 mmol/L (ref 135–145)

## 2022-03-02 LAB — CBC
HCT: 27.5 % — ABNORMAL LOW (ref 36.0–46.0)
Hemoglobin: 9 g/dL — ABNORMAL LOW (ref 12.0–15.0)
MCH: 31.4 pg (ref 26.0–34.0)
MCHC: 32.7 g/dL (ref 30.0–36.0)
MCV: 95.8 fL (ref 80.0–100.0)
Platelets: 283 10*3/uL (ref 150–400)
RBC: 2.87 MIL/uL — ABNORMAL LOW (ref 3.87–5.11)
RDW: 15.6 % — ABNORMAL HIGH (ref 11.5–15.5)
WBC: 11.7 10*3/uL — ABNORMAL HIGH (ref 4.0–10.5)
nRBC: 0.2 % (ref 0.0–0.2)

## 2022-03-02 LAB — GLUCOSE, CAPILLARY
Glucose-Capillary: 129 mg/dL — ABNORMAL HIGH (ref 70–99)
Glucose-Capillary: 156 mg/dL — ABNORMAL HIGH (ref 70–99)
Glucose-Capillary: 140 mg/dL — ABNORMAL HIGH (ref 70–99)
Glucose-Capillary: 161 mg/dL — ABNORMAL HIGH (ref 70–99)

## 2022-03-02 LAB — AMMONIA: Ammonia: 42 umol/L — ABNORMAL HIGH (ref 9–35)

## 2022-03-02 MED ORDER — SODIUM CHLORIDE 0.9 % IV SOLN: INTRAVENOUS | Status: AC

## 2022-03-02 MED ORDER — POTASSIUM CHLORIDE CRYS ER 20 MEQ PO TBCR
40.0000 meq | EXTENDED_RELEASE_TABLET | Freq: Four times a day (QID) | ORAL | Status: AC
  Administered 2022-03-02 (×2): 40 meq via ORAL
  Filled 2022-03-02 (×2): qty 2

## 2022-03-02 NOTE — Progress Notes (Signed)
Progress Note  Patient: Kaitlin Gomez BSW:[id number] DOB: Oct 07, 1934  DOA: 02/25/2022  DOS: 03/02/2022    Brief hospital course:  KARLEE STAFF is an 87 y.o. female with a history of Crohn's disease s/p sigmoid colectomy, colovesical fistula repair 1995 at [facility], T2DM, HTN, HLD who presented to [facility] with several episodes of hematochezia found to have active bleeding in the ascending colon by CT angiography, transferred to [facility] for IR evaluation and subsequently underwent angiography with coil embolization. Hemoglobin 14.3 on arrival,  down to 8.3g/dl, status post coil embolization by IR 10/1.  Assessment and Plan:  Diverticular bleed in ascending colon: -  now s/p coil embolization by IR Dr. Earleen Newport 10/1. Previous colonoscopy showed diverticulosis, suspected cause of painless hematochezia despite hx IBD.  - Monitor H/H as below, so far hemoglobin remained stable last BM yesterday morning with small amount of what appears to be old blood. - Do not submerge R CFA access site for 7 days. No longer requires any bedrest.  -Hemoglobin remained stable  ABLA due to GI bleed:  -Overall hemoglobin has been stable over last 48 hours, continue to monitor closely and transfuse as needed.   - Continue holding ASA   -Hemoglobin remained stable  Hospital delirium -Patient significantly confused yesterday requiring as needed Haldol, this has improved she remains confused but less restless and agitated. -CT head with no acute finding -EEG is pending  AKI -Creatinine elevated at 1.38 today, she had some urinary retention yesterday and poor oral intake, will keep on IV fluids overnight  Urinary retention -She required Foley insertion 10/6  Sinus bradycardia,  -1st degree AVB: Improved when beta blocker held. TSH 0.811. Limited echo showed preserved LVEF and no wall motion abnormalities.  - To help with BP control, home atenolol restarted, modest bradycardia which is asymptomatic is noted.   Hypokalemia:   - repleting  Crohn's disease:  - No evidence of flare at this time.  - Suggest she reengage with GI as outpatient, hasn't been seen since 2018.   T2DM: Well-controlled with HbA1c 6.3%.  - Hold metformin with recent contrast administration (bicarb wnl at 24) - Sensitive SSI  Resistant HTN:  - Continue home medications.  - Restart home dose clonidine.  - Continue norvasc - Restart lasix, ACEi as Cr is stable, wnl, legs are edematous, and BP still elevated  HLD:  - Continue statin  Subjective:      Patient remains confused, but overall this has improved, she is less agitated and restless today.  Objective: Vitals:   03/02/22 0400 03/02/22 0800 03/02/22 1131 03/02/22 1523  BP: (!) 126/41  (!) 148/61 (!) 143/48  Pulse: 62 76 71   Resp: 19  20   Temp:  98.3 F (36.8 C) 98.9 F (37.2 C) 98.6 F (37 C)  TempSrc:  Oral Oral Oral  SpO2: 98% 100%  100%   Gen: 87 y.o. female in no distress Awake Alert, she remains confused, but much less restless and agitated today Symmetrical Chest wall movement, Good air movement bilaterally, CTAB RRR,No Gallops,Rubs or new Murmurs, No Parasternal Heave +ve B.Sounds, Abd Soft, No tenderness, No rebound - guarding or rigidity. No Cyanosis, Clubbing or edema, No new Rash or bruise       Data Personally reviewed: CBC: Recent Labs  Lab 02/26/22 0825 02/27/22 0426 02/28/22 1455 03/01/22 0405 03/02/22 0929  WBC 11.4* 9.0 10.8* 8.8 11.7*  NEUTROABS 9.2*  --   --   --   --   HGB  10.2* 8.3* 9.1* 8.9* 9.0*  HCT 30.6* 25.5* 27.2* 27.2* 27.5*  MCV 93.3 93.4 91.9 94.8 95.8  PLT 214 174 198 234 528   Basic Metabolic Panel: Recent Labs  Lab 02/25/22 0654 02/25/22 1425 02/26/22 0230 02/27/22 0426 03/02/22 0929  NA 140  --  142 141 137  K 3.3*  --  3.6 3.5 3.1*  CL 109  --  108 107 102  CO2 23  --  24 27 21*  GLUCOSE 133*  --  158* 117* 138*  BUN 16  --  '12 13 19  '$ CREATININE 1.09*  --  1.00 0.91 1.38*  CALCIUM 9.2  --  8.8* 8.6*  8.9  MG  --  1.5*  --   --   --    GFR: Estimated Creatinine Clearance: 33.5 mL/min (A) (by C-G formula based on SCr of 1.38 mg/dL (H)). Liver Function Tests: Recent Labs  Lab 02/25/22 0654  AST 22  ALT 20  ALKPHOS 67  BILITOT 0.6  PROT 7.2  ALBUMIN 3.9   No results for input(s): "LIPASE", "AMYLASE" in the last 168 hours. Recent Labs  Lab 03/02/22 0929  AMMONIA 42*   Coagulation Profile: Recent Labs  Lab 02/25/22 0654  INR 1.2   Cardiac Enzymes: No results for input(s): "CKTOTAL", "CKMB", "CKMBINDEX", "TROPONINI" in the last 168 hours. BNP (last 3 results) No results for input(s): "PROBNP" in the last 8760 hours. HbA1C: No results for input(s): "HGBA1C" in the last 72 hours.  CBG: Recent Labs  Lab 03/01/22 1539 03/01/22 2122 03/02/22 0846 03/02/22 1130 03/02/22 1529  GLUCAP 143* 168* 129* 156* 140*   Lipid Profile: No results for input(s): "CHOL", "HDL", "LDLCALC", "TRIG", "CHOLHDL", "LDLDIRECT" in the last 72 hours. Thyroid Function Tests: No results for input(s): "TSH", "T4TOTAL", "FREET4", "T3FREE", "THYROIDAB" in the last 72 hours.  Anemia Panel: No results for input(s): "VITAMINB12", "FOLATE", "FERRITIN", "TIBC", "IRON", "RETICCTPCT" in the last 72 hours. Urine analysis:    Component Value Date/Time   COLORURINE YELLOW 03/01/2022 1807   APPEARANCEUR CLEAR 03/01/2022 1807   APPEARANCEUR Cloudy 10/27/2011 1403   LABSPEC 1.012 03/01/2022 1807   LABSPEC 1.017 10/27/2011 1403   PHURINE 8.0 03/01/2022 1807   GLUCOSEU NEGATIVE 03/01/2022 1807   GLUCOSEU Negative 10/27/2011 1403   HGBUR NEGATIVE 03/01/2022 1807   BILIRUBINUR NEGATIVE 03/01/2022 1807   BILIRUBINUR Negative 10/27/2011 1403   KETONESUR 20 (A) 03/01/2022 1807   PROTEINUR NEGATIVE 03/01/2022 1807   NITRITE NEGATIVE 03/01/2022 1807   LEUKOCYTESUR NEGATIVE 03/01/2022 1807   LEUKOCYTESUR 3+ 10/27/2011 1403   No results found for this or any previous visit (from the past 240 hour(s)).   CT  HEAD WO CONTRAST (5MM)  Result Date: 03/02/2022 CLINICAL DATA:  Delirium.  Altered mental status. EXAM: CT HEAD WITHOUT CONTRAST TECHNIQUE: Contiguous axial images were obtained from the base of the skull through the vertex without intravenous contrast. RADIATION DOSE REDUCTION: This exam was performed according to the departmental dose-optimization program which includes automated exposure control, adjustment of the mA and/or kV according to patient size and/or use of iterative reconstruction technique. COMPARISON:  CT head without contrast 01/21/2015 FINDINGS: Brain: No acute infarct, hemorrhage, or mass lesion is present. No significant white matter lesions are present. Acute septum pellucidum noted. Ventricular size is normal. No significant extraaxial fluid collection is present. The brainstem and cerebellum are within normal limits. Vascular: Atherosclerotic calcifications are present within the cavernous internal carotid arteries bilaterally. No hyperdense vessel is present. Skull: Hyperostosis is again  noted. Calvarium is intact. No focal lytic or blastic lesions are present. No significant extracranial soft tissue lesion is present. Sinuses/Orbits: Chronic right maxillary sinus opacification again noted. The paranasal sinuses and mastoid air cells are otherwise clear. Right lens replacement is present. Globes and orbits are otherwise within normal limits. IMPRESSION: 1. No acute intracranial abnormality or significant interval change. 2. Stable chronic right maxillary sinus disease. Electronically Signed   By: San Morelle M.D.   On: 03/02/2022 15:12    Family Communication: None at bedside, left voicemail for friend Sammuel Hines  Disposition: Status is: Inpatient Remains inpatient appropriate because: Continued monitoring for blood loss anemia  Planned Discharge Destination: Home  Phillips Climes, MD 03/02/2022 3:59 PM Page by [email or web address]

## 2022-03-02 NOTE — Progress Notes (Signed)
EEG complete - results pending 

## 2022-03-02 NOTE — Progress Notes (Signed)
Physical Therapy Treatment Patient Details Name: Kaitlin Gomez MRN: [id number] DOB: 08/21/1934 Today's Date: 03/02/2022   History of Present Illness 87 y/o female admitted as a tranfer from [facility] to ED 10/1 for work up in IR for active bleed in her ascending colon.  S/p US guided cmbolization of the vasa recta supplying pt's diverticular bleed right colon on 10/1. PMHx: DM2, HLD, HTN, chron's ds s/p sigmoid colectormy.    PT Comments    Pt received in supine, agreeable to therapy session, A&O x3-4 and following ~75% of simple commands but needs multimodal cues to perform supine/standing exercises and for safety with posture/RW use during gait. Pt mildly impulsive and with decreased safety awareness and activity tolerance. Pt would benefit from increased supervision/assist at home given waxing/waning mental status. Pt continues to benefit from PT services to progress toward functional mobility goals.    Recommendations for follow up therapy are one component of a multi-disciplinary discharge planning process, led by the attending physician.  Recommendations may be updated based on patient status, additional functional criteria and insurance authorization.  Follow Up Recommendations  Home health PT (pending progress)     Assistance Recommended at Discharge Frequent or constant Supervision/Assistance (currently would need full time supervision for safety given AMS)  Patient can return home with the following Assistance with cooking/housework;Assist for transportation;A little help with walking and/or transfers   Equipment Recommendations  None recommended by PT (pt has RW and cane at home)    Recommendations for Other Services       Precautions / Restrictions Precautions Precautions: Fall Precaution Comments: waxing/waning AMS Restrictions Weight Bearing Restrictions: No     Mobility  Bed Mobility Overal bed mobility: Needs Assistance Bed Mobility: Supine to Sit, Sit to Supine      Supine to sit: Min guard Sit to supine: Min assist   General bed mobility comments: verbal cues to perform with min guard for safety, minA for BLE assist to return to supine; use of bed features/rails and HOB elevated    Transfers Overall transfer level: Needs assistance Equipment used: Rolling walker (2 wheels) Transfers: Sit to/from Stand Sit to Stand: Min assist           General transfer comment: verbal cues for hand placement and cues for safety, pt impulsive to sit and poor eccentric control    Ambulation/Gait Ambulation/Gait assistance: Min guard Gait Distance (Feet): 30 Feet Assistive device: Rolling walker (2 wheels) Gait Pattern/deviations: Step-through pattern, Trunk flexed Gait velocity: decreased     General Gait Details: frequent cues for upright posture and improved proximity to RW; VSS on RA per tele monitor   Stairs             Wheelchair Mobility    Modified Rankin (Stroke Patients Only)       Balance Overall balance assessment: Needs assistance Sitting-balance support: No upper extremity supported, Feet supported Sitting balance-Leahy Scale: Poor     Standing balance support: Bilateral upper extremity supported, During functional activity, Reliant on assistive device for balance Standing balance-Leahy Scale: Fair Standing balance comment: reliant on RW for balance when standing                            Cognition Arousal/Alertness: Awake/alert Behavior During Therapy: Impulsive Overall Cognitive Status: Within Functional Limits for tasks assessed  General Comments: able to give correct date (day of week, month and year but not specific calendar date), situation, and city/hospital (with increased cues), not able to state "[place]" but tangential throughout and not seeming to understand questions/speaking before being asked questions. Pt following ~75% of simple commands, often  misunderstanding cues given despite repetition needs multimodal cues for exercises.        Exercises Other Exercises Other Exercises: supine BLE AAROM: ankle pumps (mostly AROM after initial cues), heel slides, hip abduction, SAQ x10 reps ea Other Exercises: standing BLE AROM: hip flexion x10 reps ea    General Comments General comments (skin integrity, edema, etc.): VSS on RA      Pertinent Vitals/Pain Pain Assessment Pain Assessment: Faces Faces Pain Scale: Hurts a little bit Pain Location: RLE (?) pt not localizing Pain Descriptors / Indicators: Discomfort, Grimacing, Sore Pain Intervention(s): Monitored during session, Repositioned     PT Goals (current goals can now be found in the care plan section) Acute Rehab PT Goals Patient Stated Goal: feeling that I can be independent PT Goal Formulation: With patient Time For Goal Achievement: 03/12/22 Progress towards PT goals: Progressing toward goals    Frequency    Min 3X/week      PT Plan Current plan remains appropriate       AM-PAC PT "6 Clicks" Mobility   Outcome Measure  Help needed turning from your back to your side while in a flat bed without using bedrails?: A Little Help needed moving from lying on your back to sitting on the side of a flat bed without using bedrails?: A Little Help needed moving to and from a bed to a chair (including a wheelchair)?: A Little Help needed standing up from a chair using your arms (e.g., wheelchair or bedside chair)?: A Little Help needed to walk in hospital room?: A Lot (mod safety cues) Help needed climbing 3-5 steps with a railing? : A Lot (anticipate mod safety cues given AMS) 6 Click Score: 16    End of Session Equipment Utilized During Treatment: Gait belt Activity Tolerance: Patient tolerated treatment well;Other (comment);Patient limited by fatigue (mild confusion limiting carryover of PTA instructions) Patient left: in bed;with call bell/phone within reach;with  bed alarm set;with SCD's reapplied;Other (comment) (bed in chair posture and blinds opened more to promote proper sleep-wake cycle) Nurse Communication: Mobility status;Other (comment) (MD entering room at end of session and notified pt still confused with PT session) PT Visit Diagnosis: Unsteadiness on feet (R26.81);Muscle weakness (generalized) (M62.81)     Time: 5397-6734 PT Time Calculation (min) (ACUTE ONLY): 24 min  Charges:  $Gait Training: 8-22 mins $Therapeutic Exercise: 8-22 mins                     Chelise Hanger P., PTA Acute Rehabilitation Services Secure Chat Preferred 9a-5:30pm Office: [place] 03/02/2022, 4:29 PM

## 2022-03-02 NOTE — Procedures (Signed)
Patient Name: SHAKTI FLEER  MRN: [id number]  Epilepsy Attending: Lora Havens  Referring Physician/Provider: Albertine Patricia, MD  Date: 03/02/2022 Duration: 24.27 mins  Patient history: 87yo F with ams . EEG to evaluate for seizure  Level of alertness: Awake, asleep  AEDs during EEG study: None  Technical aspects: This EEG study was done with scalp electrodes positioned according to the 10-20 International system of electrode placement. Electrical activity was reviewed with band pass filter of 1-'70Hz'$ , sensitivity of 7 uV/mm, display speed of 35m/sec with a '60Hz'$  notched filter applied as appropriate. EEG data were recorded continuously and digitally stored.  Video monitoring was available and reviewed as appropriate.  Description: No clear posterior dominant rhythm was seen. Sleep was characterized by vertex waves, sleep spindles (12 to 14 Hz), maximal frontocentral region. EEG showed continuous generalized 3 to 6 Hz theta-delta slowing. Hyperventilation and photic stimulation were not performed.     ABNORMALITY - Continuous slow, generalized  IMPRESSION: This study is suggestive of moderate diffuse encephalopathy, nonspecific etiology. No seizures or epileptiform discharges were seen throughout the recording.  Olivia Pavelko OBarbra Sarks

## 2022-03-02 NOTE — Progress Notes (Signed)
Occupational Therapy Treatment Patient Details Name: Kaitlin Gomez MRN: [id number] DOB: 04-16-1935 Today's Date: 03/02/2022   History of present illness 87 y/o female admitted as a tranfer from [facility] to ED 10/1 for work up in IR for active bleed in her ascending colon.  S/p US guided cmbolization of the vasa recta supplying pt's diverticular bleed right colon on 10/1. PMHx: DM2, HLD, HTN, chron's ds s/p sigmoid colectormy.   OT comments  Patient received in supine and agreeable to OT session. Patient appeared oriented but fixated on calling to order breakfast though reminded that it was already ordered. Patient was min guard to get to EOB and transfer to recliner.  Patient was discovered to have soiled bed and stood for cleaning from recliner. Patient able to wash feet seated in recliner and required mod assist to doff and donn socks.  Patient to continue to be followed by acute OT.    Recommendations for follow up therapy are one component of a multi-disciplinary discharge planning process, led by the attending physician.  Recommendations may be updated based on patient status, additional functional criteria and insurance authorization.    Follow Up Recommendations  No OT follow up    Assistance Recommended at Discharge Intermittent Supervision/Assistance  Patient can return home with the following  Assistance with cooking/housework   Equipment Recommendations  Other (comment) (discussed benefits of shower chair, patient declining at this time)    Recommendations for Other Services      Precautions / Restrictions Precautions Precautions: Fall Restrictions Weight Bearing Restrictions: No       Mobility Bed Mobility Overal bed mobility: Needs Assistance Bed Mobility: Supine to Sit     Supine to sit: Min guard     General bed mobility comments: verbal cues to perform with min guard for safety    Transfers Overall transfer level: Needs assistance Equipment used: Rolling  walker (2 wheels) Transfers: Sit to/from Stand Sit to Stand: Min guard           General transfer comment: verbal cues for hand placement and cues for safety     Balance Overall balance assessment: Needs assistance Sitting-balance support: No upper extremity supported, Feet supported Sitting balance-Leahy Scale: Poor     Standing balance support: Bilateral upper extremity supported, During functional activity, Reliant on assistive device for balance Standing balance-Leahy Scale: Fair Standing balance comment: reliant on RW for balance when standing for LB bathing                           ADL either performed or assessed with clinical judgement   ADL Overall ADL's : Needs assistance/impaired     Grooming: Wash/dry hands;Wash/dry face;Oral care;Set up;Sitting Grooming Details (indicate cue type and reason): in recliner     Lower Body Bathing: Moderate assistance;Sit to/from stand Lower Body Bathing Details (indicate cue type and reason): required assistance to clean bottom while standing, able to bathe feet     Lower Body Dressing: Moderate assistance;Sitting/lateral leans Lower Body Dressing Details (indicate cue type and reason): mod assist to doff and donn socks Toilet Transfer: Min guard;Ambulation;Rolling walker (2 wheels) Toilet Transfer Details (indicate cue type and reason): simulated to recliner           General ADL Comments: frequent cues for safety    Extremity/Trunk Assessment              Vision       Perception     Praxis  Cognition Arousal/Alertness: Awake/alert Behavior During Therapy: WFL for tasks assessed/performed Overall Cognitive Status: Within Functional Limits for tasks assessed                                 General Comments: able to give correct date, situation, and place but fixated on calling to order breakfast after being told that her meals have been ordered        Exercises       Shoulder Instructions       General Comments      Pertinent Vitals/ Pain       Pain Assessment Pain Assessment: No/denies pain  Home Living                                          Prior Functioning/Environment              Frequency  Min 3X/week        Progress Toward Goals  OT Goals(current goals can now be found in the care plan section)  Progress towards OT goals: Progressing toward goals  Acute Rehab OT Goals Patient Stated Goal: get better OT Goal Formulation: With patient Time For Goal Achievement: 03/13/22 Potential to Achieve Goals: Good ADL Goals Pt Will Perform Lower Body Dressing: with modified independence;sit to/from stand Pt Will Transfer to Toilet: with modified independence;ambulating;regular height toilet Pt Will Perform Toileting - Clothing Manipulation and hygiene: with modified independence;sitting/lateral leans;sit to/from stand Pt Will Perform Tub/Shower Transfer: Tub transfer;with modified independence;ambulating;rolling walker Additional ADL Goal #1: Pt will demonstrate increased activity tolerance to perform four ADLs standing at sink with Supervision  Plan Discharge plan remains appropriate    Co-evaluation                 AM-PAC OT "6 Clicks" Daily Activity     Outcome Measure   Help from another person eating meals?: A Little Help from another person taking care of personal grooming?: A Little Help from another person toileting, which includes using toliet, bedpan, or urinal?: A Little Help from another person bathing (including washing, rinsing, drying)?: A Little Help from another person to put on and taking off regular upper body clothing?: A Little Help from another person to put on and taking off regular lower body clothing?: A Little 6 Click Score: 18    End of Session Equipment Utilized During Treatment: Rolling walker (2 wheels)  OT Visit Diagnosis: Unsteadiness on feet (R26.81);Other  abnormalities of gait and mobility (R26.89);Muscle weakness (generalized) (M62.81)   Activity Tolerance Patient tolerated treatment well   Patient Left in chair;with call bell/phone within reach;with chair alarm set   Nurse Communication Mobility status        Time: 1610-9604 OT Time Calculation (min): 32 min  Charges: OT General Charges $OT Visit: 1 Visit OT Treatments $Self Care/Home Management : 23-37 mins  Lodema Hong, [place]  Office [phone]   Trixie Dredge 03/02/2022, 9:44 AM

## 2022-03-02 NOTE — TOC Progression Note (Signed)
Transition of Care [facility]) - Progression Note    Patient Details  Name: TENESIA ESCUDERO MRN: [id number] Date of Birth: 1934-09-07  Transition of Care [facility] CM/SW [place], RN Phone Number: 03/02/2022, 2:08 PM  Clinical Narrative:    RNCM went to room to discuss [facility] with patient again, Patient having a procedure at this time. Will revisit if time allows.   Expected Discharge Plan: [place] Barriers to Discharge: Continued Medical Work up  Expected Discharge Plan and Services Expected Discharge Plan: [place]   Discharge Planning Services: CM Consult   Living arrangements for the past 2 months: Single Family Home                                       Social Determinants of Health (SDOH) Interventions    Readmission Risk Interventions     No data to display

## 2022-03-03 ENCOUNTER — Inpatient Hospital Stay (HOSPITAL_COMMUNITY): Payer: Medicare HMO

## 2022-03-03 DIAGNOSIS — I10 Essential (primary) hypertension: Secondary | ICD-10-CM | POA: Diagnosis not present

## 2022-03-03 DIAGNOSIS — K922 Gastrointestinal hemorrhage, unspecified: Secondary | ICD-10-CM | POA: Diagnosis not present

## 2022-03-03 DIAGNOSIS — K5090 Crohn's disease, unspecified, without complications: Secondary | ICD-10-CM | POA: Diagnosis not present

## 2022-03-03 LAB — GLUCOSE, CAPILLARY
Glucose-Capillary: 119 mg/dL — ABNORMAL HIGH (ref 70–99)
Glucose-Capillary: 214 mg/dL — ABNORMAL HIGH (ref 70–99)
Glucose-Capillary: 148 mg/dL — ABNORMAL HIGH (ref 70–99)
Glucose-Capillary: 172 mg/dL — ABNORMAL HIGH (ref 70–99)

## 2022-03-03 LAB — TYPE AND SCREEN
ABO/RH(D): A POS
Antibody Screen: NEGATIVE

## 2022-03-03 LAB — BASIC METABOLIC PANEL
Anion gap: 10 (ref 5–15)
BUN: 16 mg/dL (ref 8–23)
CO2: 26 mmol/L (ref 22–32)
Calcium: 9.2 mg/dL (ref 8.9–10.3)
Chloride: 101 mmol/L (ref 98–111)
Creatinine, Ser: 1.25 mg/dL — ABNORMAL HIGH (ref 0.44–1.00)
GFR, Estimated: 42 mL/min — ABNORMAL LOW (ref >60)
Glucose, Bld: 106 mg/dL — ABNORMAL HIGH (ref 70–99)
Potassium: 3.6 mmol/L (ref 3.5–5.1)
Sodium: 137 mmol/L (ref 135–145)

## 2022-03-03 LAB — CBC
HCT: 25 % — ABNORMAL LOW (ref 36.0–46.0)
HCT: 28.7 % — ABNORMAL LOW (ref 36.0–46.0)
Hemoglobin: 8.1 g/dL — ABNORMAL LOW (ref 12.0–15.0)
Hemoglobin: 9.2 g/dL — ABNORMAL LOW (ref 12.0–15.0)
MCH: 30.8 pg (ref 26.0–34.0)
MCH: 31.1 pg (ref 26.0–34.0)
MCHC: 32.4 g/dL (ref 30.0–36.0)
MCHC: 32.1 g/dL (ref 30.0–36.0)
MCV: 95.1 fL (ref 80.0–100.0)
MCV: 97 fL (ref 80.0–100.0)
Platelets: 261 10*3/uL (ref 150–400)
Platelets: 310 10*3/uL (ref 150–400)
RBC: 2.63 MIL/uL — ABNORMAL LOW (ref 3.87–5.11)
RBC: 2.96 MIL/uL — ABNORMAL LOW (ref 3.87–5.11)
RDW: 15.5 % (ref 11.5–15.5)
RDW: 15.5 % (ref 11.5–15.5)
WBC: 8.7 10*3/uL (ref 4.0–10.5)
WBC: 11 10*3/uL — ABNORMAL HIGH (ref 4.0–10.5)
nRBC: 0 % (ref 0.0–0.2)
nRBC: 0.2 % (ref 0.0–0.2)

## 2022-03-03 MED ORDER — SENNOSIDES-DOCUSATE SODIUM 8.6-50 MG PO TABS
1.0000 | ORAL_TABLET | Freq: Two times a day (BID) | ORAL | Status: DC
  Administered 2022-03-03 – 2022-03-07 (×9): 1 via ORAL
  Filled 2022-03-03 (×9): qty 1

## 2022-03-03 MED ORDER — HALOPERIDOL LACTATE 5 MG/ML IJ SOLN
1.0000 mg | Freq: Once | INTRAMUSCULAR | Status: AC
  Administered 2022-03-03: 1 mg via INTRAVENOUS
  Filled 2022-03-03: qty 1

## 2022-03-03 NOTE — Plan of Care (Deleted)
  Problem: Education: Goal: Ability to describe self-care measures that may prevent or decrease complications (Diabetes Survival Skills Education) will improve Outcome: Progressing Goal: Individualized Educational Video(s) Outcome: Progressing   Problem: Coping: Goal: Ability to adjust to condition or change in health will improve Outcome: Progressing   Problem: Fluid Volume: Goal: Ability to maintain a balanced intake and output will improve Outcome: Progressing   Problem: Health Behavior/Discharge Planning: Goal: Ability to identify and utilize available resources and services will improve Outcome: Progressing Goal: Ability to manage health-related needs will improve Outcome: Progressing   Problem: Metabolic: Goal: Ability to maintain appropriate glucose levels will improve Outcome: Progressing   Problem: Nutritional: Goal: Maintenance of adequate nutrition will improve Outcome: Progressing Goal: Progress toward achieving an optimal weight will improve Outcome: Progressing   Problem: Skin Integrity: Goal: Risk for impaired skin integrity will decrease Outcome: Progressing   Problem: Tissue Perfusion: Goal: Adequacy of tissue perfusion will improve Outcome: Progressing   Problem: Education: Goal: Understanding of CV disease, CV risk reduction, and recovery process will improve Outcome: Progressing Goal: Individualized Educational Video(s) Outcome: Progressing   Problem: Activity: Goal: Ability to return to baseline activity level will improve Outcome: Progressing   Problem: Cardiovascular: Goal: Ability to achieve and maintain adequate cardiovascular perfusion will improve Outcome: Progressing Goal: Vascular access site(s) Level 0-1 will be maintained Outcome: Progressing   Problem: Health Behavior/Discharge Planning: Goal: Ability to safely manage health-related needs after discharge will improve Outcome: Progressing   Problem: Education: Goal: Knowledge  of General Education information will improve Description: Including pain rating scale, medication(s)/side effects and non-pharmacologic comfort measures Outcome: Progressing   Problem: Health Behavior/Discharge Planning: Goal: Ability to manage health-related needs will improve Outcome: Progressing   Problem: Clinical Measurements: Goal: Ability to maintain clinical measurements within normal limits will improve Outcome: Progressing Goal: Will remain free from infection Outcome: Progressing Goal: Diagnostic test results will improve Outcome: Progressing Goal: Respiratory complications will improve Outcome: Progressing Goal: Cardiovascular complication will be avoided Outcome: Progressing   Problem: Activity: Goal: Risk for activity intolerance will decrease Outcome: Progressing   Problem: Nutrition: Goal: Adequate nutrition will be maintained Outcome: Progressing   Problem: Coping: Goal: Level of anxiety will decrease Outcome: Progressing   Problem: Elimination: Goal: Will not experience complications related to bowel motility Outcome: Progressing Goal: Will not experience complications related to urinary retention Outcome: Progressing   Problem: Pain Managment: Goal: General experience of comfort will improve Outcome: Progressing   Problem: Safety: Goal: Ability to remain free from injury will improve Outcome: Progressing   Problem: Skin Integrity: Goal: Risk for impaired skin integrity will decrease Outcome: Progressing   

## 2022-03-03 NOTE — Progress Notes (Signed)
Progress Note  Patient: Kaitlin Gomez OJJ:[id number] DOB: February 06, 1935  DOA: 02/25/2022  DOS: 03/03/2022    Brief hospital course:  Kaitlin Gomez is an 87 y.o. female with a history of Crohn's disease s/p sigmoid colectomy, colovesical fistula repair 1995 at [facility], T2DM, HTN, HLD who presented to [facility] with several episodes of hematochezia found to have active bleeding in the ascending colon by CT angiography, transferred to [facility] for IR evaluation and subsequently underwent angiography with coil embolization. Hemoglobin 14.3 on arrival,  down to 8.3g/dl, status post coil embolization by IR 10/1.  Assessment and Plan:  Diverticular bleed in ascending colon: -  now s/p coil embolization by IR Dr. Earleen Newport 10/1. Previous colonoscopy showed diverticulosis, suspected cause of painless hematochezia despite hx IBD.  - Monitor H/H as below, so far hemoglobin remained stable last BM yesterday morning with small amount of what appears to be old blood. - Do not submerge R CFA access site for 7 days. No longer requires any bedrest.  -Overall hemoglobin remained stable, this morning is 8.1, she did have 1 bowel movement this morning with old blood, will continue to monitor closely, mild downtrending hemoglobin most likely in the setting of IV fluids and dilution  ABLA due to GI bleed:  -Overall hemoglobin has been stable over last 48 hours, continue to monitor closely and transfuse as needed.   - Continue holding ASA    Hospital delirium -Patient significantly confused yesterday requiring as needed Haldol, this has improved she remains confused but less restless and agitated. -CT head with no acute finding -EEG with no acute findings -Patient confusion is improved, but she does remain with significant personality change, will proceed with MRI brain  AKI -Creatinine elevated at 1.38 today, she had some urinary retention yesterday and poor oral intake, will keep on IV fluids overnight  Urinary retention -She  required Foley insertion 10/6, voiding trial today  Sinus bradycardia,  -1st degree AVB: Improved when beta blocker held. TSH 0.811. Limited echo showed preserved LVEF and no wall motion abnormalities.  - To help with BP control, home atenolol restarted, modest bradycardia which is asymptomatic is noted.   Hypokalemia:  - repleting  Crohn's disease:  - No evidence of flare at this time.  - Suggest she reengage with GI as outpatient, hasn't been seen since 2018.   T2DM: Well-controlled with HbA1c 6.3%.  - Hold metformin with recent contrast administration (bicarb wnl at 24) - Sensitive SSI  Resistant HTN:  - Continue home medications.  - Restart home dose clonidine.  - Continue norvasc - Restart lasix, ACEi as Cr is stable, wnl, legs are edematous, and BP still elevated  HLD:  - Continue statin  Subjective:     Confusion has been improving, but remains significant, she had 1 BM this morning with blood, Foley catheter was discontinued today  Objective: Vitals:   03/02/22 2309 03/03/22 0341 03/03/22 0910 03/03/22 1223  BP: (!) 121/45 (!) 123/50 114/61 139/68  Pulse: 65 63 81 76  Resp: '12 18 19 19  '$ Temp: 100 F (37.8 C) 99 F (37.2 C) 98 F (36.7 C)   TempSrc: Oral Oral    SpO2: 99% 99%     Gen: 87 y.o. female in no distress Awake Alert, she is more appropriate and conversant today, Symmetrical Chest wall movement, Good air movement bilaterally, CTAB RRR,No Gallops,Rubs or new Murmurs, No Parasternal Heave +ve B.Sounds, Abd Soft, No tenderness, No rebound - guarding or rigidity. No Cyanosis, Clubbing or edema,  No new Rash or bruise       Data Personally reviewed: CBC: Recent Labs  Lab 02/26/22 0825 02/27/22 0426 02/28/22 1455 03/01/22 0405 03/02/22 0929 03/03/22 0326  WBC 11.4* 9.0 10.8* 8.8 11.7* 8.7  NEUTROABS 9.2*  --   --   --   --   --   HGB 10.2* 8.3* 9.1* 8.9* 9.0* 8.1*  HCT 30.6* 25.5* 27.2* 27.2* 27.5* 25.0*  MCV 93.3 93.4 91.9 94.8 95.8 95.1   PLT 214 174 198 234 283 269   Basic Metabolic Panel: Recent Labs  Lab 02/25/22 0654 02/25/22 1425 02/26/22 0230 02/27/22 0426 03/02/22 0929 03/03/22 0326  NA 140  --  142 141 137 137  K 3.3*  --  3.6 3.5 3.1* 3.6  CL 109  --  108 107 102 101  CO2 23  --  24 27 21* 26  GLUCOSE 133*  --  158* 117* 138* 106*  BUN 16  --  '12 13 19 16  '$ CREATININE 1.09*  --  1.00 0.91 1.38* 1.25*  CALCIUM 9.2  --  8.8* 8.6* 8.9 9.2  MG  --  1.5*  --   --   --   --    GFR: Estimated Creatinine Clearance: 37 mL/min (A) (by C-G formula based on SCr of 1.25 mg/dL (H)). Liver Function Tests: Recent Labs  Lab 02/25/22 0654  AST 22  ALT 20  ALKPHOS 67  BILITOT 0.6  PROT 7.2  ALBUMIN 3.9   No results for input(s): "LIPASE", "AMYLASE" in the last 168 hours. Recent Labs  Lab 03/02/22 0929  AMMONIA 42*   Coagulation Profile: Recent Labs  Lab 02/25/22 0654  INR 1.2   Cardiac Enzymes: No results for input(s): "CKTOTAL", "CKMB", "CKMBINDEX", "TROPONINI" in the last 168 hours. BNP (last 3 results) No results for input(s): "PROBNP" in the last 8760 hours. HbA1C: No results for input(s): "HGBA1C" in the last 72 hours.  CBG: Recent Labs  Lab 03/02/22 1130 03/02/22 1529 03/02/22 2120 03/03/22 0859 03/03/22 1226  GLUCAP 156* 140* 161* 119* 214*   Lipid Profile: No results for input(s): "CHOL", "HDL", "LDLCALC", "TRIG", "CHOLHDL", "LDLDIRECT" in the last 72 hours. Thyroid Function Tests: No results for input(s): "TSH", "T4TOTAL", "FREET4", "T3FREE", "THYROIDAB" in the last 72 hours.  Anemia Panel: No results for input(s): "VITAMINB12", "FOLATE", "FERRITIN", "TIBC", "IRON", "RETICCTPCT" in the last 72 hours. Urine analysis:    Component Value Date/Time   COLORURINE YELLOW 03/01/2022 1807   APPEARANCEUR CLEAR 03/01/2022 1807   APPEARANCEUR Cloudy 10/27/2011 1403   LABSPEC 1.012 03/01/2022 1807   LABSPEC 1.017 10/27/2011 1403   PHURINE 8.0 03/01/2022 1807   GLUCOSEU NEGATIVE  03/01/2022 1807   GLUCOSEU Negative 10/27/2011 1403   HGBUR NEGATIVE 03/01/2022 1807   BILIRUBINUR NEGATIVE 03/01/2022 1807   BILIRUBINUR Negative 10/27/2011 1403   KETONESUR 20 (A) 03/01/2022 1807   PROTEINUR NEGATIVE 03/01/2022 1807   NITRITE NEGATIVE 03/01/2022 1807   LEUKOCYTESUR NEGATIVE 03/01/2022 1807   LEUKOCYTESUR 3+ 10/27/2011 1403   No results found for this or any previous visit (from the past 240 hour(s)).   CT HEAD WO CONTRAST (5MM)  Result Date: 03/02/2022 CLINICAL DATA:  Delirium.  Altered mental status. EXAM: CT HEAD WITHOUT CONTRAST TECHNIQUE: Contiguous axial images were obtained from the base of the skull through the vertex without intravenous contrast. RADIATION DOSE REDUCTION: This exam was performed according to the departmental dose-optimization program which includes automated exposure control, adjustment of the mA and/or kV according to patient  size and/or use of iterative reconstruction technique. COMPARISON:  CT head without contrast 01/21/2015 FINDINGS: Brain: No acute infarct, hemorrhage, or mass lesion is present. No significant white matter lesions are present. Acute septum pellucidum noted. Ventricular size is normal. No significant extraaxial fluid collection is present. The brainstem and cerebellum are within normal limits. Vascular: Atherosclerotic calcifications are present within the cavernous internal carotid arteries bilaterally. No hyperdense vessel is present. Skull: Hyperostosis is again noted. Calvarium is intact. No focal lytic or blastic lesions are present. No significant extracranial soft tissue lesion is present. Sinuses/Orbits: Chronic right maxillary sinus opacification again noted. The paranasal sinuses and mastoid air cells are otherwise clear. Right lens replacement is present. Globes and orbits are otherwise within normal limits. IMPRESSION: 1. No acute intracranial abnormality or significant interval change. 2. Stable chronic right maxillary sinus  disease. Electronically Signed   By: San Morelle M.D.   On: 03/02/2022 15:12   EEG adult  Result Date: 03/02/2022 Lora Havens, MD     03/02/2022  5:38 PM Patient Name: MAYKAYLA HIGHLEY MRN: [id number] Epilepsy Attending: Lora Havens Referring Physician/Provider: Albertine Patricia, MD Date: 03/02/2022 Duration: 24.27 mins Patient history: 87yo F with ams . EEG to evaluate for seizure Level of alertness: Awake, asleep AEDs during EEG study: None Technical aspects: This EEG study was done with scalp electrodes positioned according to the 10-20 International system of electrode placement. Electrical activity was reviewed with band pass filter of 1-'70Hz'$ , sensitivity of 7 uV/mm, display speed of 34m/sec with a '60Hz'$  notched filter applied as appropriate. EEG data were recorded continuously and digitally stored.  Video monitoring was available and reviewed as appropriate. Description: No clear posterior dominant rhythm was seen. Sleep was characterized by vertex waves, sleep spindles (12 to 14 Hz), maximal frontocentral region. EEG showed continuous generalized 3 to 6 Hz theta-delta slowing. Hyperventilation and photic stimulation were not performed.   ABNORMALITY - Continuous slow, generalized IMPRESSION: This study is suggestive of moderate diffuse encephalopathy, nonspecific etiology. No seizures or epileptiform discharges were seen throughout the recording. [place]   Family Communication: None at bedside, left voicemail for friend RSammuel Hines Disposition: Status is: Inpatient Remains inpatient appropriate because: Continued monitoring for blood loss anemia  Planned Discharge Destination: Home  DPhillips Climes MD 03/03/2022 3:36 PM Page by aShea [email or web address]

## 2022-03-03 NOTE — Plan of Care (Signed)
  Problem: Education: Goal: Ability to describe self-care measures that may prevent or decrease complications (Diabetes Survival Skills Education) will improve Outcome: Progressing   Problem: Skin Integrity: Goal: Risk for impaired skin integrity will decrease Outcome: Progressing   Problem: Education: Goal: Understanding of CV disease, CV risk reduction, and recovery process will improve Outcome: Progressing   Problem: Activity: Goal: Ability to return to baseline activity level will improve Outcome: Progressing   Problem: Nutrition: Goal: Adequate nutrition will be maintained Outcome: Progressing   Problem: Coping: Goal: Level of anxiety will decrease Outcome: Progressing   Problem: Skin Integrity: Goal: Risk for impaired skin integrity will decrease Outcome: Progressing

## 2022-03-03 NOTE — Progress Notes (Signed)
Physical Therapy Treatment Patient Details Name: Kaitlin Gomez MRN: [id number] DOB: 10/04/1934 Today's Date: 03/03/2022   History of Present Illness 87 y/o female admitted as a tranfer from [facility] to ED 02/25/22 for work up in IR for active bleed in her ascending colon.  S/p US guided embolization of the vasa recta supplying pt's diverticular bleed right colon on 10/1. PMHx: DM2, HLD, HTN, chron's ds s/p sigmoid colectormy.    PT Comments    Pt was able to ambulate a short distance down the hallway with RW and min assist. She has limited endurance and safety awareness, but could be mobile at home with the help of her son.  Remains mildly confused, but this may be baseline.  PT will continue to follow acutely for safe mobility progression.    Recommendations for follow up therapy are one component of a multi-disciplinary discharge planning process, led by the attending physician.  Recommendations may be updated based on patient status, additional functional criteria and insurance authorization.  Follow Up Recommendations  Home health PT     Assistance Recommended at Discharge Frequent or constant Supervision/Assistance  Patient can return home with the following Assistance with cooking/housework;Assist for transportation;A little help with walking and/or transfers   Equipment Recommendations  None recommended by PT    Recommendations for Other Services       Precautions / Restrictions Precautions Precautions: Fall Precaution Comments: waxing/waning AMS     Mobility  Bed Mobility Overal bed mobility: Needs Assistance Bed Mobility: Supine to Sit     Supine to sit: Min assist     General bed mobility comments: Attempted to see if pt could struggle for a moment and get herself up to EOB and she was ultimately unable even with the HOB mildly elevated and heavy use of the railing.    Transfers Overall transfer level: Needs assistance Equipment used: Rolling walker (2  wheels) Transfers: Sit to/from Stand, Bed to chair/wheelchair/BSC Sit to Stand: Min assist Stand pivot transfers: Min assist         General transfer comment: Min assist to stand and pivot to [facility] with RW.    Ambulation/Gait Ambulation/Gait assistance: Min assist Gait Distance (Feet): 75 Feet Assistive device: Rolling walker (2 wheels) Gait Pattern/deviations: Step-through pattern, Shuffle Gait velocity: decreased Gait velocity interpretation: <1.31 ft/sec, indicative of household ambulator   General Gait Details: Pt with flexed trunk, shuffling gait, cues to stay closer to RW and for upright posture,.   Stairs             Wheelchair Mobility    Modified Rankin (Stroke Patients Only)       Balance Overall balance assessment: Needs assistance Sitting-balance support: Feet supported, Bilateral upper extremity supported Sitting balance-Leahy Scale: Fair Sitting balance - Comments: close supervision EOB   Standing balance support: Bilateral upper extremity supported, During functional activity, Reliant on assistive device for balance Standing balance-Leahy Scale: Poor Standing balance comment: needs UE support and light support from therapist in standing.  Was able to attempt her own peri care, needed assist for thoroughness.                            Cognition Arousal/Alertness: Awake/alert Behavior During Therapy: Restless, Impulsive Overall Cognitive Status: No family/caregiver present to determine baseline cognitive functioning  General Comments: Not sure if she has some baseline confusion and is generally functional at home, however, def impulsive, takes hands off of RW, midly confused.        Exercises      General Comments        Pertinent Vitals/Pain Pain Assessment Pain Assessment: No/denies pain    Home Living                          Prior Function            PT Goals  (current goals can now be found in the care plan section) Acute Rehab PT Goals Patient Stated Goal: to get up OOB. Progress towards PT goals: Progressing toward goals    Frequency    Min 3X/week      PT Plan Current plan remains appropriate    Co-evaluation              AM-PAC PT "6 Clicks" Mobility   Outcome Measure  Help needed turning from your back to your side while in a flat bed without using bedrails?: A Little Help needed moving from lying on your back to sitting on the side of a flat bed without using bedrails?: A Little Help needed moving to and from a bed to a chair (including a wheelchair)?: A Little Help needed standing up from a chair using your arms (e.g., wheelchair or bedside chair)?: A Little Help needed to walk in hospital room?: A Little Help needed climbing 3-5 steps with a railing? : A Lot 6 Click Score: 17    End of Session Equipment Utilized During Treatment: Gait belt Activity Tolerance: Patient limited by fatigue Patient left: in chair;with call bell/phone within reach;with chair alarm set Nurse Communication: Mobility status;Other (comment) (RN tech that she is up and eating lunch.) PT Visit Diagnosis: Unsteadiness on feet (R26.81);Muscle weakness (generalized) (M62.81)     Time: 7001-7494 PT Time Calculation (min) (ACUTE ONLY): 25 min  Charges:  $Gait Training: 8-22 mins $Therapeutic Activity: 8-22 mins                     Verdene Lennert, PT, DPT  Acute Rehabilitation Secure chat is best for contact #[phone] office    03/03/2022, 2:25 PM

## 2022-03-03 NOTE — Plan of Care (Signed)
Problem: Education: Goal: Ability to describe self-care measures that may prevent or decrease complications (Diabetes Survival Skills Education) will improve 03/03/2022 1035 by Rosey Bath, RN Outcome: Progressing 03/03/2022 1034 by Rosey Bath, RN Outcome: Progressing Goal: Individualized Educational Video(s) 03/03/2022 1035 by Rosey Bath, RN Outcome: Progressing 03/03/2022 1034 by Rosey Bath, RN Outcome: Progressing   Problem: Coping: Goal: Ability to adjust to condition or change in health will improve 03/03/2022 1035 by Rosey Bath, RN Outcome: Progressing 03/03/2022 1034 by Rosey Bath, RN Outcome: Progressing   Problem: Fluid Volume: Goal: Ability to maintain a balanced intake and output will improve 03/03/2022 1035 by Rosey Bath, RN Outcome: Progressing 03/03/2022 1034 by Rosey Bath, RN Outcome: Progressing   Problem: Health Behavior/Discharge Planning: Goal: Ability to identify and utilize available resources and services will improve 03/03/2022 1035 by Rosey Bath, RN Outcome: Progressing 03/03/2022 1034 by Rosey Bath, RN Outcome: Progressing Goal: Ability to manage health-related needs will improve 03/03/2022 1035 by Rosey Bath, RN Outcome: Progressing 03/03/2022 1034 by Rosey Bath, RN Outcome: Progressing   Problem: Metabolic: Goal: Ability to maintain appropriate glucose levels will improve 03/03/2022 1035 by Rosey Bath, RN Outcome: Progressing 03/03/2022 1034 by Rosey Bath, RN Outcome: Progressing   Problem: Nutritional: Goal: Maintenance of adequate nutrition will improve 03/03/2022 1035 by Rosey Bath, RN Outcome: Progressing 03/03/2022 1034 by Rosey Bath, RN Outcome: Progressing Goal: Progress toward achieving an optimal weight will improve 03/03/2022 1035 by Rosey Bath, RN Outcome: Progressing 03/03/2022 1034 by Rosey Bath, RN Outcome: Progressing   Problem:  Skin Integrity: Goal: Risk for impaired skin integrity will decrease 03/03/2022 1035 by Rosey Bath, RN Outcome: Progressing 03/03/2022 1034 by Rosey Bath, RN Outcome: Progressing   Problem: Tissue Perfusion: Goal: Adequacy of tissue perfusion will improve 03/03/2022 1035 by Rosey Bath, RN Outcome: Progressing 03/03/2022 1034 by Rosey Bath, RN Outcome: Progressing   Problem: Education: Goal: Understanding of CV disease, CV risk reduction, and recovery process will improve 03/03/2022 1035 by Rosey Bath, RN Outcome: Progressing 03/03/2022 1034 by Rosey Bath, RN Outcome: Progressing Goal: Individualized Educational Video(s) 03/03/2022 1035 by Rosey Bath, RN Outcome: Progressing 03/03/2022 1034 by Rosey Bath, RN Outcome: Progressing   Problem: Activity: Goal: Ability to return to baseline activity level will improve 03/03/2022 1035 by Rosey Bath, RN Outcome: Progressing 03/03/2022 1034 by Rosey Bath, RN Outcome: Progressing   Problem: Cardiovascular: Goal: Ability to achieve and maintain adequate cardiovascular perfusion will improve 03/03/2022 1035 by Rosey Bath, RN Outcome: Progressing 03/03/2022 1034 by Rosey Bath, RN Outcome: Progressing Goal: Vascular access site(s) Level 0-1 will be maintained 03/03/2022 1035 by Rosey Bath, RN Outcome: Progressing 03/03/2022 1034 by Rosey Bath, RN Outcome: Progressing   Problem: Health Behavior/Discharge Planning: Goal: Ability to safely manage health-related needs after discharge will improve 03/03/2022 1035 by Rosey Bath, RN Outcome: Progressing 03/03/2022 1034 by Rosey Bath, RN Outcome: Progressing   Problem: Education: Goal: Knowledge of General Education information will improve Description: Including pain rating scale, medication(s)/side effects and non-pharmacologic comfort measures 03/03/2022 1035 by Rosey Bath, RN Outcome:  Progressing 03/03/2022 1034 by Rosey Bath, RN Outcome: Progressing   Problem: Health Behavior/Discharge Planning: Goal: Ability to manage health-related needs will improve 03/03/2022 1035 by Rosey Bath, RN Outcome: Progressing 03/03/2022 1034 by Rosey Bath, RN Outcome: Progressing   Problem: Clinical Measurements: Goal: Ability to maintain clinical measurements within normal limits will improve 03/03/2022 1035 by Rosey Bath, RN Outcome: Progressing 03/03/2022 1034 by Rosey Bath, RN Outcome: Progressing Goal: Will remain free from infection 03/03/2022 1035 by  Rosey Bath, RN Outcome: Progressing 03/03/2022 1034 by Rosey Bath, RN Outcome: Progressing Goal: Diagnostic test results will improve 03/03/2022 1035 by Rosey Bath, RN Outcome: Progressing 03/03/2022 1034 by Rosey Bath, RN Outcome: Progressing Goal: Respiratory complications will improve 03/03/2022 1035 by Rosey Bath, RN Outcome: Progressing 03/03/2022 1034 by Rosey Bath, RN Outcome: Progressing Goal: Cardiovascular complication will be avoided 03/03/2022 1035 by Rosey Bath, RN Outcome: Progressing 03/03/2022 1034 by Rosey Bath, RN Outcome: Progressing   Problem: Activity: Goal: Risk for activity intolerance will decrease 03/03/2022 1035 by Rosey Bath, RN Outcome: Progressing 03/03/2022 1034 by Rosey Bath, RN Outcome: Progressing   Problem: Nutrition: Goal: Adequate nutrition will be maintained 03/03/2022 1035 by Rosey Bath, RN Outcome: Progressing 03/03/2022 1034 by Rosey Bath, RN Outcome: Progressing   Problem: Coping: Goal: Level of anxiety will decrease 03/03/2022 1035 by Rosey Bath, RN Outcome: Progressing 03/03/2022 1034 by Rosey Bath, RN Outcome: Progressing   Problem: Elimination: Goal: Will not experience complications related to bowel motility 03/03/2022 1035 by Rosey Bath, RN Outcome:  Progressing 03/03/2022 1034 by Rosey Bath, RN Outcome: Progressing Goal: Will not experience complications related to urinary retention 03/03/2022 1035 by Rosey Bath, RN Outcome: Progressing 03/03/2022 1034 by Rosey Bath, RN Outcome: Progressing   Problem: Pain Managment: Goal: General experience of comfort will improve 03/03/2022 1035 by Rosey Bath, RN Outcome: Progressing 03/03/2022 1034 by Rosey Bath, RN Outcome: Progressing   Problem: Safety: Goal: Ability to remain free from injury will improve 03/03/2022 1035 by Rosey Bath, RN Outcome: Progressing 03/03/2022 1034 by Rosey Bath, RN Outcome: Progressing   Problem: Skin Integrity: Goal: Risk for impaired skin integrity will decrease 03/03/2022 1035 by Rosey Bath, RN Outcome: Progressing 03/03/2022 1034 by Rosey Bath, RN Outcome: Progressing

## 2022-03-04 ENCOUNTER — Inpatient Hospital Stay (HOSPITAL_COMMUNITY): Payer: Medicare HMO

## 2022-03-04 DIAGNOSIS — K922 Gastrointestinal hemorrhage, unspecified: Secondary | ICD-10-CM | POA: Diagnosis not present

## 2022-03-04 DIAGNOSIS — I10 Essential (primary) hypertension: Secondary | ICD-10-CM | POA: Diagnosis not present

## 2022-03-04 DIAGNOSIS — I6523 Occlusion and stenosis of bilateral carotid arteries: Secondary | ICD-10-CM | POA: Diagnosis not present

## 2022-03-04 DIAGNOSIS — I639 Cerebral infarction, unspecified: Secondary | ICD-10-CM

## 2022-03-04 LAB — CBC
HCT: 23.8 % — ABNORMAL LOW (ref 36.0–46.0)
Hemoglobin: 7.9 g/dL — ABNORMAL LOW (ref 12.0–15.0)
MCH: 31.5 pg (ref 26.0–34.0)
MCHC: 33.2 g/dL (ref 30.0–36.0)
MCV: 94.8 fL (ref 80.0–100.0)
Platelets: 281 10*3/uL (ref 150–400)
RBC: 2.51 MIL/uL — ABNORMAL LOW (ref 3.87–5.11)
RDW: 15.2 % (ref 11.5–15.5)
WBC: 8.7 10*3/uL (ref 4.0–10.5)
nRBC: 0 % (ref 0.0–0.2)

## 2022-03-04 LAB — GLUCOSE, CAPILLARY
Glucose-Capillary: 118 mg/dL — ABNORMAL HIGH (ref 70–99)
Glucose-Capillary: 131 mg/dL — ABNORMAL HIGH (ref 70–99)
Glucose-Capillary: 156 mg/dL — ABNORMAL HIGH (ref 70–99)
Glucose-Capillary: 121 mg/dL — ABNORMAL HIGH (ref 70–99)
Glucose-Capillary: 156 mg/dL — ABNORMAL HIGH (ref 70–99)

## 2022-03-04 LAB — BASIC METABOLIC PANEL
Anion gap: 12 (ref 5–15)
BUN: 20 mg/dL (ref 8–23)
CO2: 24 mmol/L (ref 22–32)
Calcium: 9.3 mg/dL (ref 8.9–10.3)
Chloride: 99 mmol/L (ref 98–111)
Creatinine, Ser: 1.06 mg/dL — ABNORMAL HIGH (ref 0.44–1.00)
GFR, Estimated: 51 mL/min — ABNORMAL LOW (ref >60)
Glucose, Bld: 118 mg/dL — ABNORMAL HIGH (ref 70–99)
Potassium: 3.8 mmol/L (ref 3.5–5.1)
Sodium: 135 mmol/L (ref 135–145)

## 2022-03-04 LAB — LIPID PANEL
Cholesterol: 118 mg/dL (ref 0–200)
HDL: 41 mg/dL (ref >40)
LDL Cholesterol: 65 mg/dL (ref 0–99)
Total CHOL/HDL Ratio: 2.9 RATIO
Triglycerides: 60 mg/dL (ref <150)
VLDL: 12 mg/dL (ref 0–40)

## 2022-03-04 MED ORDER — ASPIRIN 81 MG PO TBEC
81.0000 mg | DELAYED_RELEASE_TABLET | Freq: Every day | ORAL | Status: DC
  Administered 2022-03-04 – 2022-03-07 (×4): 81 mg via ORAL
  Filled 2022-03-04 (×4): qty 1

## 2022-03-04 MED ORDER — HEPARIN SODIUM (PORCINE) 5000 UNIT/ML IJ SOLN
5000.0000 [IU] | Freq: Three times a day (TID) | INTRAMUSCULAR | Status: DC
  Administered 2022-03-04 – 2022-03-07 (×10): 5000 [IU] via SUBCUTANEOUS
  Filled 2022-03-04 (×10): qty 1

## 2022-03-04 NOTE — Plan of Care (Signed)
  Problem: Education: Goal: Ability to describe self-care measures that may prevent or decrease complications (Diabetes Survival Skills Education) will improve Outcome: Progressing Goal: Individualized Educational Video(s) Outcome: Progressing   Problem: Coping: Goal: Ability to adjust to condition or change in health will improve Outcome: Progressing   Problem: Fluid Volume: Goal: Ability to maintain a balanced intake and output will improve Outcome: Progressing   Problem: Health Behavior/Discharge Planning: Goal: Ability to identify and utilize available resources and services will improve Outcome: Progressing Goal: Ability to manage health-related needs will improve Outcome: Progressing   Problem: Metabolic: Goal: Ability to maintain appropriate glucose levels will improve Outcome: Progressing   Problem: Nutritional: Goal: Maintenance of adequate nutrition will improve Outcome: Progressing Goal: Progress toward achieving an optimal weight will improve Outcome: Progressing   Problem: Skin Integrity: Goal: Risk for impaired skin integrity will decrease Outcome: Progressing   Problem: Tissue Perfusion: Goal: Adequacy of tissue perfusion will improve Outcome: Progressing   Problem: Education: Goal: Understanding of CV disease, CV risk reduction, and recovery process will improve Outcome: Progressing Goal: Individualized Educational Video(s) Outcome: Progressing   Problem: Activity: Goal: Ability to return to baseline activity level will improve Outcome: Progressing   Problem: Cardiovascular: Goal: Ability to achieve and maintain adequate cardiovascular perfusion will improve Outcome: Progressing Goal: Vascular access site(s) Level 0-1 will be maintained Outcome: Progressing   Problem: Health Behavior/Discharge Planning: Goal: Ability to safely manage health-related needs after discharge will improve Outcome: Progressing   Problem: Education: Goal: Knowledge  of General Education information will improve Description: Including pain rating scale, medication(s)/side effects and non-pharmacologic comfort measures Outcome: Progressing   Problem: Health Behavior/Discharge Planning: Goal: Ability to manage health-related needs will improve Outcome: Progressing   Problem: Clinical Measurements: Goal: Ability to maintain clinical measurements within normal limits will improve Outcome: Progressing Goal: Will remain free from infection Outcome: Progressing Goal: Diagnostic test results will improve Outcome: Progressing Goal: Respiratory complications will improve Outcome: Progressing Goal: Cardiovascular complication will be avoided Outcome: Progressing   Problem: Activity: Goal: Risk for activity intolerance will decrease Outcome: Progressing   Problem: Nutrition: Goal: Adequate nutrition will be maintained Outcome: Progressing   Problem: Coping: Goal: Level of anxiety will decrease Outcome: Progressing   Problem: Elimination: Goal: Will not experience complications related to bowel motility Outcome: Progressing Goal: Will not experience complications related to urinary retention Outcome: Progressing   Problem: Pain Managment: Goal: General experience of comfort will improve Outcome: Progressing   Problem: Safety: Goal: Ability to remain free from injury will improve Outcome: Progressing   Problem: Skin Integrity: Goal: Risk for impaired skin integrity will decrease Outcome: Progressing   

## 2022-03-04 NOTE — Consult Note (Signed)
Neurology Consultation  Reason for Consult: Stroke on MRI  Referring Physician: Dr. Oda Cogan  CC: No current complaints at this time.   History is obtained from: Patient, Chart review  HPI: Kaitlin Gomez is a 87 y.o. female with a medical history significant for Crohn's disease s/p sigmoid colectomy, remote history of GI bleed, type 2 diabetes mellitus, hyperlipidemia, and essential hypertension who initially presented to [facility] 02/25/22 for evaluation of hematochezia.  Work-up revealed patient with active bleeding in the ascending colon and patient was subsequently transferred to [facility] for IR s/p coil embolization.  Hospitalization complicated by acute blood loss anemia with a hemoglobin to 8.3 s/p embolization on 10/1, AKI, sinus bradycardia, and hypokalemia.  Hospitalization further complicated by confusion and agitation requiring Haldol early on 10/5.  Due to ongoing confusion and concern for personality change, MRI brain was obtained revealing few scattered subcentimeter acute ischemic infarcts involving the bilateral cerebral hemispheres concerning for thromboembolic etiology and neurology was consulted for further evaluation.  ROS: A complete ROS was performed and is negative except as noted in the HPI.   Past Medical History:  Diagnosis Date   Crohn's disease ([place])    Diabetes mellitus without complication ([place])    Hyperlipemia    Hypertension    Past Surgical History:  Procedure Laterality Date   COLONOSCOPY WITH PROPOFOL N/A 01/05/2015   Procedure: COLONOSCOPY WITH PROPOFOL;  Surgeon: Lollie Sails, MD;  Location: [facility] ENDOSCOPY;  Service: Endoscopy;  Laterality: N/A;   COLONOSCOPY WITH PROPOFOL N/A 11/02/2016   Procedure: COLONOSCOPY WITH PROPOFOL;  Surgeon: Jonathon Bellows, MD;  Location: [facility] ENDOSCOPY;  Service: Endoscopy;  Laterality: N/A;   IR ANGIOGRAM SELECTIVE EACH ADDITIONAL VESSEL  02/25/2022   IR ANGIOGRAM SELECTIVE EACH ADDITIONAL VESSEL  02/25/2022   IR ANGIOGRAM SELECTIVE  EACH ADDITIONAL VESSEL  02/25/2022   IR ANGIOGRAM VISCERAL SELECTIVE  02/25/2022   IR EMBO ART  VEN HEMORR LYMPH EXTRAV  INC GUIDE ROADMAPPING  02/25/2022   IR US GUIDE VASC ACCESS RIGHT  02/25/2022   Family History  Problem Relation Age of Onset   Hypertension Father    Social History:   reports that she has never smoked. She has never used smokeless tobacco. She reports that she does not drink alcohol and does not use drugs.  Medications  Current Facility-Administered Medications:    acetaminophen (TYLENOL) tablet 650 mg, 650 mg, Oral, Q6H PRN, 650 mg at 03/02/22 2322 **OR** acetaminophen (TYLENOL) suppository 650 mg, 650 mg, Rectal, Q6H PRN, Orma Flaming, MD   furosemide (LASIX) tablet 40 mg, 40 mg, Oral, Daily, Vance Gather B, MD, 40 mg at 03/03/22 0911   haloperidol lactate (HALDOL) injection 2 mg, 2 mg, Intravenous, Q6H PRN, Elgergawy, Silver Huguenin, MD, 2 mg at 03/01/22 1952   heparin injection 5,000 Units, 5,000 Units, Subcutaneous, Q8H, Elgergawy, Silver Huguenin, MD   hydrALAZINE (APRESOLINE) injection 10 mg, 10 mg, Intravenous, Q8H PRN, Orma Flaming, MD, 10 mg at 03/01/22 0424   insulin aspart (novoLOG) injection 0-9 Units, 0-9 Units, Subcutaneous, TID WC, Orma Flaming, MD, 1 Units at 03/03/22 1638   lisinopril (ZESTRIL) tablet 40 mg, 40 mg, Oral, Daily, Hammons, Kimberly B, RPH, 40 mg at 03/03/22 0910   ondansetron (ZOFRAN) injection 4 mg, 4 mg, Intravenous, Q6H PRN, Etta Quill, DO, 4 mg at 02/26/22 0326   pravastatin (PRAVACHOL) tablet 40 mg, 40 mg, Oral, q1800, Orma Flaming, MD, 40 mg at 03/03/22 1840   senna-docusate (Senokot-S) tablet 1 tablet, 1 tablet, Oral, BID, Elgergawy,  Silver Huguenin, MD, 1 tablet at 03/03/22 2208  Exam: Current vital signs: BP (!) 150/59 (BP Location: Left Arm)   Pulse 75   Temp 98.9 F (37.2 C) (Oral)   Resp 18   SpO2 96%  Vital signs in last 24 hours: Temp:  [97.9 F (36.6 C)-98.9 F (37.2 C)] 98.9 F (37.2 C) (10/08 0813) Pulse Rate:  [61-83]  75 (10/08 0813) Resp:  [18-20] 18 (10/08 0813) BP: (114-155)/(50-73) 150/59 (10/08 0813) SpO2:  [96 %-98 %] 96 % (10/08 0416)  GENERAL: Awake, alert, in no acute distress Psych: Affect appropriate for situation, patient is calm and cooperative with examination.  Denies confusion or agitation. Head: Normocephalic and atraumatic, without obvious abnormality EENT: Normal conjunctivae, dry mucous membranes, no OP obstruction LUNGS: Normal respiratory effort. Non-labored breathing on room air CV: Regular rate and rhythm on telemetry with frequent PVCs ABDOMEN: Soft, slightly tender to palpation over the left lower quadrant, non-distended Extremities: Warm, 2+ nonpitting edema in bilateral lower extremities  NEURO:  Mental Status: Awake, alert, and oriented to person, place, time, and situation. She is able to provide a clear and coherent history of present illness. Speech/Language: speech is fluent without dysarthria. Naming, fluency, and comprehension intact without aphasia.  Repetition impaired with at least 2-3 mistakes per phrase. No neglect is noted She is able to follow one-step commands without difficulty but requires coaching and repeat instruction for multistep commands. Cranial Nerves:  II: PERRL. Visual fields full.  III, IV, VI: EOMI. Lid elevation symmetric and full.  V: Sensation is intact to light touch and symmetrical to face.  VII: Face is symmetric resting and with movement VIII: Hearing is intact to voice IX, X: Palate elevation is symmetric. Phonation normal.  XI: Normal sternocleidomastoid and trapezius muscle strength XII: Tongue protrudes midline without fasciculations.   Motor: 5/5 strength present in bilateral upper extremities and left lower extremity. Right lower extremity appears slightly weaker than the left lower extremity with some decreased antigravity elevation and minimal right lower extremity drift.  Patient does note that this may be chronic. Tone is  normal. Bulk is normal.  Sensation: Intact to light touch bilaterally in all four extremities. No extinction to DSS present.  Coordination: FTN intact bilaterally.  Limited HKS evaluation due to pain and limited ROM at baseline. DTRs: 1+ and symmetric patellae and biceps  Gait: Deferred  NIHSS: 1a Level of Conscious.: 0 1b LOC Questions: 0 1c LOC Commands: 0 2 Best Gaze: 0 3 Visual: 0 4 Facial Palsy: 0 5a Motor Arm - left: 0 5b Motor Arm - Right: 0 6a Motor Leg - Left: 0 6b Motor Leg - Right: 1 7 Limb Ataxia: 0 8 Sensory: 0 9 Best Language: 0 10 Dysarthria: 0 11 Extinct. and Inatten.: 0 TOTAL: 1  Labs I have reviewed labs in epic and the results pertinent to this consultation are: CBC    Component Value Date/Time   WBC 8.7 03/04/2022 0440   RBC 2.51 (L) 03/04/2022 0440   HGB 7.9 (L) 03/04/2022 0440   HCT 23.8 (L) 03/04/2022 0440   PLT 281 03/04/2022 0440   MCV 94.8 03/04/2022 0440   MCH 31.5 03/04/2022 0440   MCHC 33.2 03/04/2022 0440   RDW 15.2 03/04/2022 0440   LYMPHSABS 1.6 02/26/2022 0825   MONOABS 0.6 02/26/2022 0825   EOSABS 0.0 02/26/2022 0825   BASOSABS 0.0 02/26/2022 0825   CMP     Component Value Date/Time   NA 135 03/04/2022 0440   K  3.8 03/04/2022 0440   CL 99 03/04/2022 0440   CO2 24 03/04/2022 0440   GLUCOSE 118 (H) 03/04/2022 0440   BUN 20 03/04/2022 0440   CREATININE 1.06 (H) 03/04/2022 0440   CALCIUM 9.3 03/04/2022 0440   PROT 7.2 02/25/2022 0654   ALBUMIN 3.9 02/25/2022 0654   AST 22 02/25/2022 0654   ALT 20 02/25/2022 0654   ALKPHOS 67 02/25/2022 0654   BILITOT 0.6 02/25/2022 0654   GFRNONAA 51 (L) 03/04/2022 0440   GFRAA >60 11/02/2016 0508   Lipid Panel  No results found for: "CHOL", "TRIG", "HDL", "CHOLHDL", "VLDL", "LDLCALC", "LDLDIRECT" Lab Results  Component Value Date   HGBA1C 6.3 (H) 02/25/2022   Imaging I have reviewed the images obtained:  CT-scan of the brain 10/6: 1. No acute intracranial abnormality or  significant interval change. 2. Stable chronic right maxillary sinus disease.  MRI examination of the brain 10/7: 1. Few scattered subcentimeter acute ischemic infarcts involving the bilateral cerebral hemispheres as above. No associated hemorrhage or mass effect. A central thromboembolic etiology is likely given the various vascular distributions involved. 2. Underlying mild chronic microvascular ischemic disease for age, with a few scattered small chronic cerebellar infarcts. 3. Chronic right sphenoid sinusitis.  Echocardiogram 10/2:  LVEF 60 to 70%, mild left ventricular hypertrophy.  Assessment: 87 year old female with PMHx of Crohn's disease, remote GIB, DM 2, HLD, HTN who presents as a transfer from [facility] on 02/25/2022 for IR coil embolization of diverticular bleed in the ascending colon.  Hospitalization complicated by acute blood loss anemia, sinus bradycardia, hypokalemia, and acute onset of confusion, agitation, and personality change starting on 10/5.  MRI on 10/7 with evidence of scattered acute ischemic infarcts within the bilateral cerebral hemispheres. -Examination reveals patient with impaired repetition and possibly right lower extremity subtle weakness though unsure if this is chronic.  Initial NIHSS of 0. -MRI imaging with few scattered subcentimeter acute ischemic infarcts involving the bilateral cerebral hemispheres with mild chronic microvascular ischemic change.  MRI also reveals a few scattered small chronic cerebellar infarcts. -Etiology of patient's acute encephalopathy is felt to be most likely related to hospital delirium versus acute infarctions. Patient previously on aspirin at home as prophylactic therapy that has been held since hospitalization. -MRI brain imaging with ischemic strokes in various vascular distributions is concerning for central thromboembolic etiology of ischemic strokes.  Further work-up pending. -Stroke risk factors include patient's advanced age, HTN,  HLD, DM, obesity.  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 Note/plan to be edited by MD as needed.  Anibal Henderson, AGAC-NP [facility] Pager: [phone]  Attending Neurohospitalist Addendum Patient seen and examined with APP/Resident. Agree with the history and physical as documented above. Agree with the plan as documented, which I helped formulate. I have independently reviewed the chart, obtained history, review of systems and examined the patient.I have personally reviewed pertinent head/neck/spine imaging (CT/MRI). Please feel free to call with any questions.  -- Amie Portland, MD Neurologist [facility] Pager: [phone]

## 2022-03-04 NOTE — Progress Notes (Signed)
Progress Note  Patient: Kaitlin Gomez FAO:[id number] DOB: 17-Jul-1934  DOA: 02/25/2022  DOS: 03/04/2022    Brief hospital course:  Kaitlin Gomez is an 87 y.o. female with a history of Crohn's disease s/p sigmoid colectomy, colovesical fistula repair 1995 at [facility], T2DM, HTN, HLD who presented to [facility] with several episodes of hematochezia found to have active bleeding in the ascending colon by CT angiography, transferred to [facility] for IR evaluation and subsequently underwent angiography with coil embolization. Hemoglobin 14.3 on arrival,  down to 8.3g/dl, status post coil embolization by IR 10/1.  Hemoglobin remained stable since embolization, patient did develop significant delirium, altered mental status, her CT head and EEG were negative, MRI was obtained 10/7 which was significant for acute CVA.  Assessment and Plan:  Diverticular bleed in ascending colon: -  now s/p coil embolization by IR Dr. Earleen Newport 10/1. Previous colonoscopy showed diverticulosis, suspected cause of painless hematochezia despite hx IBD.  - Monitor H/H as below, so far hemoglobin remained stable last BM yesterday morning with small amount of what appears to be old blood. - Do not submerge R CFA access site for 7 days. No longer requires any bedrest.  - She still having intermittent bowel movements with some old blood in it, but hemoglobin had remained stable  ABLA due to GI bleed:  -Overall hemoglobin has been stable over last 72  hours, continue to monitor closely and transfuse as needed.   -Aspirin remains on hold, likely will resume in 1 to 2 days now she is with acute CVA.  Acute CVA -No significant confusion, altered mental status over last few days, restless, combative, but this has significantly improved, but she remains with significant engine personality, so MRI has been obtained, which did show significant acute subcentimeter ischemic CVA in both hemispheres. -Neurology has been consulted, initial work-up significant  for significant atherosclerosis, and left carotid artery stenosis 80 to 99% -I did discuss with IR on-call, it is okay to resume aspirin for now, I will resume her on aspirin enteric-coated 81 mg daily in the setting of acute CVA by tomorrow as long her CBC remained stable, and planning to monitor her CBC closely. -Her carotid Doppler significant for severe left carotid artery stenosis 80 to 99%, will await further recommendations from neurology   Hospital delirium -She did require as needed Haldol with significant improvement -Likely her acute CVA contributing to her possible confusion.  AKI -Creatinine elevated at 1.38 today, she had some urinary retention yesterday and poor oral intake, will keep on IV fluids overnight  Urinary retention -She required Foley insertion 10/6, voiding trial today  Sinus bradycardia,  -1st degree AVB: Improved when beta blocker held. TSH 0.811. Limited echo showed preserved LVEF and no wall motion abnormalities.  - To help with BP control, home atenolol restarted, modest bradycardia which is asymptomatic is noted.   Hypokalemia:  - repleting  Crohn's disease:  - No evidence of flare at this time.  - Suggest she reengage with GI as outpatient, hasn't been seen since 2018.   T2DM: Well-controlled with HbA1c 6.3%.  - Hold metformin with recent contrast administration (bicarb wnl at 24) - Sensitive SSI  Resistant HTN:  -Has been better controlled after resuming her meds, but now she is with acute CVA, so all meds has been discontinued to allow room for permissive hypertension.  HLD:  - Continue statin  Subjective:     Patient is oriented x3, but despite that she remains confused, cannot give reliable  complaints, but as discussed with staff she had bowel movement x2 yesterday with old dark blood.     Objective: Vitals:   03/03/22 2358 03/04/22 0416 03/04/22 0813 03/04/22 0923  BP: (!) 121/50 (!) 125/53 (!) 150/59 (!) 143/54  Pulse: 61 65 75    Resp: '20 18 18   '$ Temp: 98.2 F (36.8 C) 98 F (36.7 C) 98.9 F (37.2 C)   TempSrc: Oral Oral Oral   SpO2: 96% 96%     Gen: 87 y.o. female in no distress Awake Alert, Oriented X 3, but she remains with some impaired cognition and insight  movement, Good air movement bilaterally, CTAB RRR,No Gallops,Rubs or new Murmurs, No Parasternal Heave +ve B.Sounds, Abd Soft, No tenderness, No rebound - guarding or rigidity. No Cyanosis, Clubbing or edema, No new Rash or bruise        Data Personally reviewed: CBC: Recent Labs  Lab 02/26/22 0825 02/27/22 0426 03/01/22 0405 03/02/22 0929 03/03/22 0326 03/03/22 1920 03/04/22 0440  WBC 11.4*   < > 8.8 11.7* 8.7 11.0* 8.7  NEUTROABS 9.2*  --   --   --   --   --   --   HGB 10.2*   < > 8.9* 9.0* 8.1* 9.2* 7.9*  HCT 30.6*   < > 27.2* 27.5* 25.0* 28.7* 23.8*  MCV 93.3   < > 94.8 95.8 95.1 97.0 94.8  PLT 214   < > 234 283 261 310 281   < > = values in this interval not displayed.   Basic Metabolic Panel: Recent Labs  Lab 02/25/22 1425 02/26/22 0230 02/27/22 0426 03/02/22 0929 03/03/22 0326 03/04/22 0440  NA  --  142 141 137 137 135  K  --  3.6 3.5 3.1* 3.6 3.8  CL  --  108 107 102 101 99  CO2  --  24 27 21* 26 24  GLUCOSE  --  158* 117* 138* 106* 118*  BUN  --  '12 13 19 16 20  '$ CREATININE  --  1.00 0.91 1.38* 1.25* 1.06*  CALCIUM  --  8.8* 8.6* 8.9 9.2 9.3  MG 1.5*  --   --   --   --   --    GFR: Estimated Creatinine Clearance: 43.6 mL/min (A) (by C-G formula based on SCr of 1.06 mg/dL (H)). Liver Function Tests: No results for input(s): "AST", "ALT", "ALKPHOS", "BILITOT", "PROT", "ALBUMIN" in the last 168 hours.  No results for input(s): "LIPASE", "AMYLASE" in the last 168 hours. Recent Labs  Lab 03/02/22 0929  AMMONIA 42*   Coagulation Profile: No results for input(s): "INR", "PROTIME" in the last 168 hours.  Cardiac Enzymes: No results for input(s): "CKTOTAL", "CKMB", "CKMBINDEX", "TROPONINI" in the last 168  hours. BNP (last 3 results) No results for input(s): "PROBNP" in the last 8760 hours. HbA1C: No results for input(s): "HGBA1C" in the last 72 hours.  CBG: Recent Labs  Lab 03/03/22 1226 03/03/22 1625 03/03/22 2045 03/04/22 0817 03/04/22 1229  GLUCAP 214* 148* 172* 118* 131*   Lipid Profile: Recent Labs    03/04/22 0440  CHOL 118  HDL 41  LDLCALC 65  TRIG 60  CHOLHDL 2.9   Thyroid Function Tests: No results for input(s): "TSH", "T4TOTAL", "FREET4", "T3FREE", "THYROIDAB" in the last 72 hours.  Anemia Panel: No results for input(s): "VITAMINB12", "FOLATE", "FERRITIN", "TIBC", "IRON", "RETICCTPCT" in the last 72 hours. Urine analysis:    Component Value Date/Time   COLORURINE YELLOW 03/01/2022 1807   APPEARANCEUR  CLEAR 03/01/2022 1807   APPEARANCEUR Cloudy 10/27/2011 1403   LABSPEC 1.012 03/01/2022 1807   LABSPEC 1.017 10/27/2011 1403   PHURINE 8.0 03/01/2022 1807   GLUCOSEU NEGATIVE 03/01/2022 1807   GLUCOSEU Negative 10/27/2011 1403   HGBUR NEGATIVE 03/01/2022 1807   BILIRUBINUR NEGATIVE 03/01/2022 1807   BILIRUBINUR Negative 10/27/2011 1403   KETONESUR 20 (A) 03/01/2022 1807   PROTEINUR NEGATIVE 03/01/2022 1807   NITRITE NEGATIVE 03/01/2022 1807   LEUKOCYTESUR NEGATIVE 03/01/2022 1807   LEUKOCYTESUR 3+ 10/27/2011 1403   No results found for this or any previous visit (from the past 240 hour(s)).   VAS US CAROTID  Result Date: 03/04/2022 Carotid Arterial Duplex Study Patient Name:  Kaitlin Gomez  Date of Exam:   03/04/2022 Medical Rec #: [id number]       Accession #:    [id number] Date of Birth: 01/12/1935       Patient Gender: F Patient Age:   55 years Exam Location:  [facility] Procedure:      VAS US CAROTID Referring Phys: Anibal Henderson --------------------------------------------------------------------------------  Indications:       CVA and New altered mental status. Risk Factors:      Hypertension, hyperlipidemia, Diabetes. Other Factors:      Admitted for GI Bleed. Comparison Study:  No prior study on file Performing Technologist: Sharion Dove RVS  Examination Guidelines: A complete evaluation includes B-mode imaging, spectral Doppler, color Doppler, and power Doppler as needed of all accessible portions of each vessel. Bilateral testing is considered an integral part of a complete examination. Limited examinations for reoccurring indications may be performed as noted.  Right Carotid Findings: +----------+--------+--------+--------+------------------+------------------+           PSV cm/sEDV cm/sStenosisPlaque DescriptionComments           +----------+--------+--------+--------+------------------+------------------+ CCA Prox  103     17                                                   +----------+--------+--------+--------+------------------+------------------+ CCA Distal104     16                                intimal thickening +----------+--------+--------+--------+------------------+------------------+ ICA Prox  96      28              heterogenous                         +----------+--------+--------+--------+------------------+------------------+ ICA Mid   101     27      1-39%   heterogenous                         +----------+--------+--------+--------+------------------+------------------+ ICA Distal114     37              heterogenous                         +----------+--------+--------+--------+------------------+------------------+ ECA       100     2                                                    +----------+--------+--------+--------+------------------+------------------+ +----------+--------+-------+--------+-------------------+  PSV cm/sEDV cmsDescribeArm Pressure (mmHG) +----------+--------+-------+--------+-------------------+ [id number]                                         +----------+--------+-------+--------+-------------------+  +---------+--------+--+--------+--+ VertebralPSV cm/s71EDV cm/s16 +---------+--------+--+--------+--+  Left Carotid Findings: +----------+--------+--------+--------+------------------+--------+           PSV cm/sEDV cm/sStenosisPlaque DescriptionComments +----------+--------+--------+--------+------------------+--------+ CCA Prox  115     17              calcific                   +----------+--------+--------+--------+------------------+--------+ CCA Distal79      18                                         +----------+--------+--------+--------+------------------+--------+ ICA Prox  333     107     80-99%  calcific                   +----------+--------+--------+--------+------------------+--------+ ICA Mid   221     53                                         +----------+--------+--------+--------+------------------+--------+ ICA Distal192     43                                         +----------+--------+--------+--------+------------------+--------+ ECA       221     44                                         +----------+--------+--------+--------+------------------+--------+ +----------+--------+--------+--------+-------------------+           PSV cm/sEDV cm/sDescribeArm Pressure (mmHG) +----------+--------+--------+--------+-------------------+ [id number]                                          +----------+--------+--------+--------+-------------------+ +---------+--------+--+--------+--+ VertebralPSV cm/s65EDV cm/s17 +---------+--------+--+--------+--+   Summary: Right Carotid: Velocities in the right ICA are consistent with a 1-39% stenosis. Left Carotid: Velocities in the left ICA are consistent with a 80-99% stenosis. Vertebrals:  Bilateral vertebral arteries demonstrate antegrade flow. Subclavians: Normal flow hemodynamics were seen in bilateral subclavian              arteries. *See table(s) above for measurements and  observations.  Electronically signed by Antony Contras MD on 03/04/2022 at 12:36:39 PM.    Final    MR BRAIN WO CONTRAST  Result Date: 03/03/2022 CLINICAL DATA:  Initial evaluation for altered mental status. EXAM: MRI HEAD WITHOUT CONTRAST TECHNIQUE: Multiplanar, multiecho pulse sequences of the brain and surrounding structures were obtained without intravenous contrast. COMPARISON:  Prior head CT from 03/02/2022. FINDINGS: Brain: Cerebral volume within normal limits. Scattered patchy T2/FLAIR hyperintensity involving the periventricular deep white matter both cerebral hemispheres, most consistent with chronic microvascular ischemic disease, mild for age. Few scattered small chronic cerebellar infarcts noted. Few scattered foci of restricted diffusion seen involving the bilateral cerebral hemispheres (series  3, images 34, 28, 23), consistent with acute ischemic infarcts. Largest of these foci seen at the left temporal region and measures 8 mm. No associated hemorrhage or mass effect. Gray-white matter differentiation otherwise maintained. No acute or chronic intracranial hemorrhage. No mass lesion, midline shift or mass effect. No hydrocephalus or extra-axial fluid collection. Cavum et septum pellucidum noted. Pituitary gland and suprasellar region normal. Incidental note made of a tiny subcentimeter benign appearing pineal cyst. Vascular: Major intracranial vascular flow voids are maintained. Skull and upper cervical spine: Craniocervical junction with normal limits. Bone marrow signal intensity normal. Hyperostosis frontalis interna noted. No scalp soft tissue abnormality. Sinuses/Orbits: Prior ocular lens replacement on the right. Chronic right sphenoid sinusitis noted. Paranasal sinuses are otherwise largely clear. No mastoid effusion. Other: None. IMPRESSION: 1. Few scattered subcentimeter acute ischemic infarcts involving the bilateral cerebral hemispheres as above. No associated hemorrhage or mass effect. A  central thromboembolic etiology is likely given the various vascular distributions involved. 2. Underlying mild chronic microvascular ischemic disease for age, with a few scattered small chronic cerebellar infarcts. 3. Chronic right sphenoid sinusitis. Electronically Signed   By: Jeannine Boga M.D.   On: 03/03/2022 19:54   DG Chest Port 1 View  Result Date: 03/03/2022 CLINICAL DATA:  Altered mental status EXAM: PORTABLE CHEST 1 VIEW COMPARISON:  Portable exam 1607 hours compared to 01/23/2015 FINDINGS: Enlargement of cardiac silhouette. Mediastinal contours and pulmonary vascularity normal. Minimal linear bibasilar scarring Lungs otherwise clear. No pulmonary infiltrate, pleural effusion, or pneumothorax. Bones demineralized. IMPRESSION: No acute abnormalities. Electronically Signed   By: Lavonia Dana M.D.   On: 03/03/2022 16:19   CT HEAD WO CONTRAST (5MM)  Result Date: 03/02/2022 CLINICAL DATA:  Delirium.  Altered mental status. EXAM: CT HEAD WITHOUT CONTRAST TECHNIQUE: Contiguous axial images were obtained from the base of the skull through the vertex without intravenous contrast. RADIATION DOSE REDUCTION: This exam was performed according to the departmental dose-optimization program which includes automated exposure control, adjustment of the mA and/or kV according to patient size and/or use of iterative reconstruction technique. COMPARISON:  CT head without contrast 01/21/2015 FINDINGS: Brain: No acute infarct, hemorrhage, or mass lesion is present. No significant white matter lesions are present. Acute septum pellucidum noted. Ventricular size is normal. No significant extraaxial fluid collection is present. The brainstem and cerebellum are within normal limits. Vascular: Atherosclerotic calcifications are present within the cavernous internal carotid arteries bilaterally. No hyperdense vessel is present. Skull: Hyperostosis is again noted. Calvarium is intact. No focal lytic or blastic lesions  are present. No significant extracranial soft tissue lesion is present. Sinuses/Orbits: Chronic right maxillary sinus opacification again noted. The paranasal sinuses and mastoid air cells are otherwise clear. Right lens replacement is present. Globes and orbits are otherwise within normal limits. IMPRESSION: 1. No acute intracranial abnormality or significant interval change. 2. Stable chronic right maxillary sinus disease. Electronically Signed   By: San Morelle M.D.   On: 03/02/2022 15:12   EEG adult  Result Date: 03/02/2022 Lora Havens, MD     03/02/2022  5:38 PM Patient Name: Kaitlin Gomez MRN: [id number] Epilepsy Attending: Lora Havens Referring Physician/Provider: Albertine Patricia, MD Date: 03/02/2022 Duration: 24.27 mins Patient history: 87yo F with ams . EEG to evaluate for seizure Level of alertness: Awake, asleep AEDs during EEG study: None Technical aspects: This EEG study was done with scalp electrodes positioned according to the 10-20 International system of electrode placement. Electrical activity was reviewed with band pass filter  of 1-'70Hz'$ , sensitivity of 7 uV/mm, display speed of 66m/sec with a '60Hz'$  notched filter applied as appropriate. EEG data were recorded continuously and digitally stored.  Video monitoring was available and reviewed as appropriate. Description: No clear posterior dominant rhythm was seen. Sleep was characterized by vertex waves, sleep spindles (12 to 14 Hz), maximal frontocentral region. EEG showed continuous generalized 3 to 6 Hz theta-delta slowing. Hyperventilation and photic stimulation were not performed.   ABNORMALITY - Continuous slow, generalized IMPRESSION: This study is suggestive of moderate diffuse encephalopathy, nonspecific etiology. No seizures or epileptiform discharges were seen throughout the recording. PLora Havens   Family Communication: I was able to find her brother phone number, Kaitlin Gomez, [phone], he was  updated, from now on he will be next of kin/point of contact.  Disposition: Status is: Inpatient Remains inpatient appropriate because: Continued monitoring for blood loss anemia  Planned Discharge Destination: Home  DPhillips Climes MD 03/04/2022 1:03 PM Page by aShea [email or web address]

## 2022-03-04 NOTE — Progress Notes (Signed)
VASCULAR LAB    Carotid duplex has been performed.  See CV proc for preliminary results.  Called critical results to Dr. Serita Butcher, [facility], RVT 03/04/2022, 12:35 PM

## 2022-03-05 ENCOUNTER — Inpatient Hospital Stay (HOSPITAL_COMMUNITY): Payer: Medicare HMO

## 2022-03-05 DIAGNOSIS — E785 Hyperlipidemia, unspecified: Secondary | ICD-10-CM

## 2022-03-05 DIAGNOSIS — I639 Cerebral infarction, unspecified: Secondary | ICD-10-CM

## 2022-03-05 LAB — BASIC METABOLIC PANEL
Anion gap: 12 (ref 5–15)
BUN: 16 mg/dL (ref 8–23)
CO2: 25 mmol/L (ref 22–32)
Calcium: 9.5 mg/dL (ref 8.9–10.3)
Chloride: 103 mmol/L (ref 98–111)
Creatinine, Ser: 0.93 mg/dL (ref 0.44–1.00)
GFR, Estimated: >60 mL/min (ref >60)
Glucose, Bld: 133 mg/dL — ABNORMAL HIGH (ref 70–99)
Potassium: 3.8 mmol/L (ref 3.5–5.1)
Sodium: 140 mmol/L (ref 135–145)

## 2022-03-05 LAB — CBC
HCT: 25 % — ABNORMAL LOW (ref 36.0–46.0)
Hemoglobin: 8.4 g/dL — ABNORMAL LOW (ref 12.0–15.0)
MCH: 31.5 pg (ref 26.0–34.0)
MCHC: 33.6 g/dL (ref 30.0–36.0)
MCV: 93.6 fL (ref 80.0–100.0)
Platelets: 325 10*3/uL (ref 150–400)
RBC: 2.67 MIL/uL — ABNORMAL LOW (ref 3.87–5.11)
RDW: 15.2 % (ref 11.5–15.5)
WBC: 7.8 10*3/uL (ref 4.0–10.5)
nRBC: 0.4 % — ABNORMAL HIGH (ref 0.0–0.2)

## 2022-03-05 LAB — GLUCOSE, CAPILLARY
Glucose-Capillary: 118 mg/dL — ABNORMAL HIGH (ref 70–99)
Glucose-Capillary: 170 mg/dL — ABNORMAL HIGH (ref 70–99)
Glucose-Capillary: 158 mg/dL — ABNORMAL HIGH (ref 70–99)
Glucose-Capillary: 175 mg/dL — ABNORMAL HIGH (ref 70–99)

## 2022-03-05 NOTE — Progress Notes (Signed)
BLE venous duplex has been completed.    Results can be found under chart review under CV PROC. 03/05/2022 4:35 PM Ivori Storr RVT, RDMS

## 2022-03-05 NOTE — TOC Progression Note (Signed)
Transition of Care [facility]) - Progression Note    Patient Details  Name: Kaitlin Gomez MRN: [id number] Date of Birth: [birth year]  [facility] CM/SW Contact  Cyndi Bender, RN Phone Number: 03/05/2022, 4:25 PM  Clinical Narrative:     Patient continues to declines home health. Patient states great niece can transport home when stable for discharge.  Expected Discharge Plan: [place] Barriers to Discharge: Continued Medical Work up  Expected Discharge Plan and Services Expected Discharge Plan: [place]   Discharge Planning Services: CM Consult   Living arrangements for the past 2 months: Single Family Home                                       Social Determinants of Health (SDOH) Interventions    Readmission Risk Interventions     No data to display

## 2022-03-05 NOTE — Progress Notes (Addendum)
STROKE TEAM PROGRESS NOTE   INTERVAL HISTORY Patient was seen in bed this AM. Patient denied symptoms of confusion. Notes some weakness in her legs but feels she already had leg weakness prior to hospital admission. Denies history of afib. MRI scan of the brain shows bilateral small embolic infarcts in different vascular distributions in both hemispheres.  Echocardiogram shows ejection fraction of 60 to 65%.  Carotid ultrasound shows greater than 80% left ICA stenosis.  MR angiogram of the brain shows diffuse intracranial atherosclerosis.  LDL cholesterol 65 mg percent.  Hemoglobin A1c 6.3. Vitals:   03/04/22 2333 03/05/22 0412 03/05/22 0813 03/05/22 0959  BP: (!) 120/52 109/89 (!) 149/76 (!) 158/58  Pulse: 97 (!) 129 97   Resp: '17 17 16   '$ Temp: 98.7 F (37.1 C) 97.9 F (36.6 C) 97.6 F (36.4 C)   TempSrc: Oral Oral Oral   SpO2: 99% 99%     CBC:  Recent Labs  Lab 03/04/22 0440 03/05/22 0543  WBC 8.7 7.8  HGB 7.9* 8.4*  HCT 23.8* 25.0*  MCV 94.8 93.6  PLT 281 627   Basic Metabolic Panel:  Recent Labs  Lab 03/04/22 0440 03/05/22 0543  NA 135 140  K 3.8 3.8  CL 99 103  CO2 24 25  GLUCOSE 118* 133*  BUN 20 16  CREATININE 1.06* 0.93  CALCIUM 9.3 9.5   Lipid Panel:  Recent Labs  Lab 03/04/22 0440  CHOL 118  TRIG 60  HDL 41  CHOLHDL 2.9  VLDL 12  LDLCALC 65   HgbA1c: No results for input(s): "HGBA1C" in the last 168 hours. Urine Drug Screen: No results for input(s): "LABOPIA", "COCAINSCRNUR", "LABBENZ", "AMPHETMU", "THCU", "LABBARB" in the last 168 hours.  Alcohol Level No results for input(s): "ETH" in the last 168 hours.  IMAGING past 24 hours MR ANGIO HEAD WO CONTRAST  Result Date: 03/04/2022 CLINICAL DATA:  Stroke, determine source. EXAM: MRA HEAD WITHOUT CONTRAST TECHNIQUE: Angiographic images of the Circle of Willis were acquired using MRA technique without intravenous contrast. COMPARISON:  Brain MRI from yesterday FINDINGS: Anterior circulation: Extensive  atheromatous irregularity of the carotid siphons. Flow reducing stenosis could be present at the posterior genu, appearance likely accentuated by turbulent flow. Advanced intracranial branch atherosclerosis with high-grade left M2 and right A2 segment stenoses. Negative for aneurysm or major branch occlusion. Posterior circulation: The vertebral and basilar arteries are diffusely patent. Extensive heterogeneity of the posterior cerebral and left PICA branches. No major branch occlusion or aneurysm IMPRESSION: Advanced intracranial atherosclerosis with dominant narrowings listed above. No emergent finding. Electronically Signed   By: Jorje Guild M.D.   On: 03/04/2022 13:03   VAS US CAROTID  Result Date: 03/04/2022 Carotid Arterial Duplex Study Patient Name:  Kaitlin Gomez  Date of Exam:   03/04/2022 Medical Rec #: [id number]       Accession #:    [id number] Date of Birth: 09-Feb-1935       Patient Gender: F Patient Age:   87 years Exam Location:  [facility] Procedure:      VAS US CAROTID Referring Phys: Anibal Henderson --------------------------------------------------------------------------------  Indications:       CVA and New altered mental status. Risk Factors:      Hypertension, hyperlipidemia, Diabetes. Other Factors:     Admitted for GI Bleed. Comparison Study:  No prior study on file Performing Technologist: Sharion Dove RVS  Examination Guidelines: A complete evaluation includes B-mode imaging, spectral Doppler, color Doppler, and power Doppler as needed  of all accessible portions of each vessel. Bilateral testing is considered an integral part of a complete examination. Limited examinations for reoccurring indications may be performed as noted.  Right Carotid Findings: +----------+--------+--------+--------+------------------+------------------+           PSV cm/sEDV cm/sStenosisPlaque DescriptionComments            +----------+--------+--------+--------+------------------+------------------+ CCA Prox  103     17                                                   +----------+--------+--------+--------+------------------+------------------+ CCA Distal104     16                                intimal thickening +----------+--------+--------+--------+------------------+------------------+ ICA Prox  96      28              heterogenous                         +----------+--------+--------+--------+------------------+------------------+ ICA Mid   101     27      1-39%   heterogenous                         +----------+--------+--------+--------+------------------+------------------+ ICA Distal114     37              heterogenous                         +----------+--------+--------+--------+------------------+------------------+ ECA       100     2                                                    +----------+--------+--------+--------+------------------+------------------+ +----------+--------+-------+--------+-------------------+           PSV cm/sEDV cmsDescribeArm Pressure (mmHG) +----------+--------+-------+--------+-------------------+ [id number]                                         +----------+--------+-------+--------+-------------------+ +---------+--------+--+--------+--+ VertebralPSV cm/s71EDV cm/s16 +---------+--------+--+--------+--+  Left Carotid Findings: +----------+--------+--------+--------+------------------+--------+           PSV cm/sEDV cm/sStenosisPlaque DescriptionComments +----------+--------+--------+--------+------------------+--------+ CCA Prox  115     17              calcific                   +----------+--------+--------+--------+------------------+--------+ CCA Distal79      18                                         +----------+--------+--------+--------+------------------+--------+ ICA Prox  333     107      80-99%  calcific                   +----------+--------+--------+--------+------------------+--------+ ICA Mid   221     53                                         +----------+--------+--------+--------+------------------+--------+  ICA Distal192     43                                         +----------+--------+--------+--------+------------------+--------+ ECA       221     44                                         +----------+--------+--------+--------+------------------+--------+ +----------+--------+--------+--------+-------------------+           PSV cm/sEDV cm/sDescribeArm Pressure (mmHG) +----------+--------+--------+--------+-------------------+ [id number]                                          +----------+--------+--------+--------+-------------------+ +---------+--------+--+--------+--+ VertebralPSV cm/s65EDV cm/s17 +---------+--------+--+--------+--+   Summary: Right Carotid: Velocities in the right ICA are consistent with a 1-39% stenosis. Left Carotid: Velocities in the left ICA are consistent with a 80-99% stenosis. Vertebrals:  Bilateral vertebral arteries demonstrate antegrade flow. Subclavians: Normal flow hemodynamics were seen in bilateral subclavian              arteries. *See table(s) above for measurements and observations.  Electronically signed by Antony Contras MD on 03/04/2022 at 12:36:39 PM.    Final     PHYSICAL EXAM General: Pleasant, well-appearing elderly lady in bed. No acute distress. Extremities: Normal ROM. 5/5 strength in RUE, LUE, and LLE. 4/5 strength in RLE Skin: Warm and dry. No obvious rash or lesions. Neuro: A&Ox3. CN II-XII intact.  No aphasia,, l apraxia or dysarthria.  No cerebellar abnormalities. Moves all extremities. Normal sensation. No focal deficit. No dysarthria. Psych: Normal mood and affect   ASSESSMENT/PLAN Kaitlin Gomez is a 87 y.o. female with a medical history significant for Crohn's disease s/p sigmoid  colectomy, remote history of GI bleed, type 2 diabetes mellitus, hyperlipidemia, and essential hypertension who initially presented to [facility] 02/25/22 for evaluation of hematochezia. Due to ongoing confusion and concern for personality change, MRI brain was obtained revealing few scattered subcentimeter acute ischemic infarcts involving the bilateral cerebral hemispheres concerning for thromboembolic etiology and neurology was consulted for further evaluation.   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 No associated hemorrhage or mass effect. A central thromboembolic etiology is likely given the various vascular distributions involved. MRA  Advanced intracranial atherosclerosis with dominant narrowings Carotid Doppler  Right Carotid: Velocities in the right ICA are consistent with a 1-39% stenosis. Left Carotid: Velocities in the left ICA are consistent with a 80-99% stenosis.  2D Echo EF 60-65% LDL 65 HgbA1c 6.3 VTE prophylaxis - lovenox    Diet   DIET DYS 3 Room service appropriate? Yes; Fluid consistency: Thin   aspirin 81 mg daily prior to admission, now on aspirin 81 mg daily. DAPT not recommended at this time due to GI bleed risk. LE US doppler ordered to assess for DVT 30 day heart monitor recommended Therapy recommendations:  home health Disposition:  home  Hypertension Home meds:  amlodipine, atenolol, clonidine, quinapril Unstable Permissive hypertension (OK if < 220/120) but gradually normalize in 5-7 days  Long-term BP goal normotensive  Hyperlipidemia Home meds:  lovastatin pravastatin 40 mg started in hospital LDL 65, goal < 70 Continue statin at discharge  Prediabetes Home meds:   metformin HgbA1c 6.3, goal < 7.0 CBGs Recent Labs    03/04/22 1613 03/04/22 2124 03/05/22 0818  GLUCAP 121* 156* 118*    SSI  Other Stroke Risk Factors Advanced Age >/= 43    Other Active Problems  Diverticular bleed in ascending colon: -  now s/p coil embolization by IR Dr. Earleen Newport 10/1. Previous colonoscopy showed diverticulosis, suspected cause of painless hematochezia despite hx IBD.  - Monitor H/H as below, so far hemoglobin remained stable last BM yesterday morning with small amount of what appears to be old blood. - Do not submerge R CFA access site for 7 days. No longer requires any bedrest.  - She still having intermittent bowel movements with some old blood in it, but hemoglobin had remained stable  Sinus bradycardia,  -1st degree AVB: Improved when beta blocker held. TSH 0.811. Limited echo showed preserved LVEF and no wall motion abnormalities.  - To help with BP control, home atenolol restarted, modest bradycardia which is asymptomatic is noted.  Hypokalemia:  - repleting   Crohn's disease:  - No evidence of flare at this time.  - Suggest she reengage with GI as outpatient, hasn't been seen since 2018.    AKI -Creatinine elevated at 1.38 today, she had some urinary retention yesterday and poor oral intake, will keep on IV fluids overnight  Hospital day # 8  Stormy Fabian, MD PGY-1 Psychiatry  STROKE MD NOTE :  I have personally obtained history,examined this patient, reviewed notes, independently viewed imaging studies, participated in medical decision making and plan of care.ROS completed by me personally and pertinent positives fully documented  I have made any additions or clarifications directly to the above note. Agree with note above.  Patient with recent diverticular bleed requiring interventional radiology treatment as well as blood transfusion was evaluated for confusion and mental status changes and MRI shows bilateral small embolic infarcts.   Etiology likely cardioembolic from undiagnosed atrial fibrillation versus diffuse intracranial atherosclerosis.  Patient at present is not a candidate for anticoagulation given her recent GI bleed and significant anemia hence will not recommend loop recorder.  However consider 30-day external heart monitor at discharge and check lower extremity venous Dopplers for DVT.  Recommend aspirin alone.  Aggressive risk factor modification.  Elective revascularization of left carotid by vascular surgery once she is medically stable in the next few weeks.  Discussed with Dr.Ghimire.  Greater than 50% time during this 50-minute visit was spent in counseling and coordination of care about her recent GI hemorrhage and embolic stroke and discussion about evaluation and treatment and answering questions  Antony Contras, MD Medical Director [place] Pager: [phone] 03/05/2022 5:00 PM   To contact Stroke Continuity provider, please refer to [email or web address] After hours, contact General Neurology

## 2022-03-05 NOTE — Progress Notes (Signed)
Physical Therapy Re-Evaluation and Treatment Patient Details Name: Kaitlin Gomez MRN: [id number] DOB: Aug 10, 1934 Today's Date: 03/05/2022   History of Present Illness 87 y/o female admitted as a tranfer from [facility] to ED 02/25/22 for work up in IR for active bleed in her ascending colon.  S/p US guided embolization of the vasa recta supplying pt's diverticular bleed right colon on 10/1. 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 PMHx: DM2, HLD, HTN, chron's ds s/p sigmoid colectormy.    PT Comments    Per comparison to prior notes and OT first hand knowledge, pt has had a decline in her functional mobility secondary to increased cardiac response and increased fatigue with activity.  Pt is also having difficulty with processing and problem solving. Pt with OT on entry sitting back in recliner after walking to sink and increase in HR to 120-130s bpm. With seated rest break pt agreeable  to walking again however by the time she reached the door HR in high 160 bpm. Pt sat in recliner and rolled back to beside bed. Pt self reports feeling different and experiencing headache across forehead when she laughs. RN and MD notified of change in cognition and mobility. Pt may need short bout of SNF level rehab at discharge if symptoms do not improve. PT will continue to follow acutely.    Recommendations for follow up therapy are one component of a multi-disciplinary discharge planning process, led by the attending physician.  Recommendations may be updated based on patient status, additional functional criteria and insurance authorization.  Follow Up Recommendations  Home health PT     Assistance Recommended at Discharge Frequent or constant Supervision/Assistance  Patient can return home with the following Assistance with cooking/housework;Assist for transportation;A little help with walking and/or transfers   Equipment  Recommendations  None recommended by PT       Precautions / Restrictions Precautions Precautions: Fall Restrictions Weight Bearing Restrictions: No     Mobility  Bed Mobility Overal bed mobility: Needs Assistance Bed Mobility: Supine to Sit     Supine to sit: Min guard, HOB elevated     General bed mobility comments: increased time and effort    Transfers Overall transfer level: Needs assistance Equipment used: Rolling walker (2 wheels) Transfers: Sit to/from Stand Sit to Stand: Min guard           General transfer comment: Min Guard A for safety    Ambulation/Gait Ambulation/Gait assistance: Herbalist (Feet): 15 Feet Assistive device: Rolling walker (2 wheels) Gait Pattern/deviations: Step-through pattern, Trunk flexed, Shuffle Gait velocity: decreased Gait velocity interpretation: <1.31 ft/sec, indicative of household ambulator   General Gait Details: min A for steadying only able to walk to door secondary to increased fatigue and increased HR, vc for proximity to RW,         Balance Overall balance assessment: Needs assistance Sitting-balance support: No upper extremity supported, Feet supported Sitting balance-Leahy Scale: Fair     Standing balance support: During functional activity Standing balance-Leahy Scale: Fair Standing balance comment: needs UE support                            Cognition Arousal/Alertness: Awake/alert Behavior During Therapy: Flat affect Overall Cognitive Status: Impaired/Different from baseline Area of Impairment: Following commands, Problem solving, Awareness, Attention  Current Attention Level: Selective, Sustained   Following Commands: Follows one step commands with increased time   Awareness: Intellectual, Emergent Problem Solving: Slow processing, Requires verbal cues General Comments: Pt is known to this therapist from OT evaluation last week. Pt presenting  this session with decreased processing and requiring increased time for responding. Noting pt having to pause to process her thought before answering.           General Comments General comments (skin integrity, edema, etc.): HR elevating to 146 duirng walk from sink to recliner. Taking rest break. HR elevating to 169. BP 131/80 (96), awaiting doppler study for possible DVT however has been on heparin for >24 hrs      Pertinent Vitals/Pain Pain Assessment Pain Assessment: No/denies pain Faces Pain Scale: Hurts a little bit Pain Location: Reports pain when she laughs "across her forehead" Pain Intervention(s): Limited activity within patient's tolerance, Monitored during session, Repositioned     PT Goals (current goals can now be found in the care plan section) Acute Rehab PT Goals PT Goal Formulation: With patient Time For Goal Achievement: 03/12/22 Potential to Achieve Goals: Good Progress towards PT goals: Not progressing toward goals - comment (increased cardiac response to activity)    Frequency    Min 3X/week      PT Plan Current plan remains appropriate (however if cardiac response to activity and increased fatigue not rectified many need SNF level rehab)       AM-PAC PT "6 Clicks" Mobility   Outcome Measure  Help needed turning from your back to your side while in a flat bed without using bedrails?: A Little Help needed moving from lying on your back to sitting on the side of a flat bed without using bedrails?: A Little Help needed moving to and from a bed to a chair (including a wheelchair)?: A Little Help needed standing up from a chair using your arms (e.g., wheelchair or bedside chair)?: A Little Help needed to walk in hospital room?: A Little Help needed climbing 3-5 steps with a railing? : A Lot 6 Click Score: 17    End of Session Equipment Utilized During Treatment: Gait belt Activity Tolerance: Patient limited by fatigue Patient left: in chair;with  call bell/phone within reach;with chair alarm set Nurse Communication: Mobility status;Other (comment) (elevated BP and HR) PT Visit Diagnosis: Unsteadiness on feet (R26.81);Muscle weakness (generalized) (M62.81)     Time: 5329-9242 PT Time Calculation (min) (ACUTE ONLY): 27 min  Charges:   1- ReEvaluation                      Janelle Spellman B. Migdalia Dk PT, DPT Acute Rehabilitation Services Please use secure chat or  Call Office [phone]    [place] 03/05/2022, 5:51 PM

## 2022-03-05 NOTE — Evaluation (Signed)
Clinical/Bedside Swallow Evaluation Patient Details  Name: Kaitlin Gomez MRN: [id number] Date of Birth: November 06, 1934  Today's Date: 03/05/2022 Time: SLP Start Time (ACUTE ONLY): 0845 SLP Stop Time (ACUTE ONLY): 0901 SLP Time Calculation (min) (ACUTE ONLY): 16 min  Past Medical History:  Past Medical History:  Diagnosis Date   Crohn's disease ([place])    Diabetes mellitus without complication ([place])    Hyperlipemia    Hypertension    Past Surgical History:  Past Surgical History:  Procedure Laterality Date   COLONOSCOPY WITH PROPOFOL N/A 01/05/2015   Procedure: COLONOSCOPY WITH PROPOFOL;  Surgeon: Lollie Sails, MD;  [facility];  Service: Endoscopy;  Laterality: N/A;   COLONOSCOPY WITH PROPOFOL N/A 11/02/2016   Procedure: COLONOSCOPY WITH PROPOFOL;  Surgeon: Jonathon Bellows, MD;  Location: [facility] ENDOSCOPY;  Service: Endoscopy;  Laterality: N/A;   IR ANGIOGRAM SELECTIVE EACH ADDITIONAL VESSEL  02/25/2022   IR ANGIOGRAM SELECTIVE EACH ADDITIONAL VESSEL  02/25/2022   IR ANGIOGRAM SELECTIVE EACH ADDITIONAL VESSEL  02/25/2022   IR ANGIOGRAM VISCERAL SELECTIVE  02/25/2022   IR EMBO ART  VEN HEMORR LYMPH EXTRAV  INC GUIDE ROADMAPPING  02/25/2022   IR US GUIDE VASC ACCESS RIGHT  02/25/2022   HPI:  Kaitlin Gomez is a 87 y.o. female with medical history significant of chron's disease s/p sigmoid colectomy with a colovesical fistula repair on 08/09/1993, T2DM, HLD, HTN who presented to ED with complaints of multiple bright red bloody stools that started this AM. She went to [facility] and was found to have active bleed in her ascending colon and was sent to [facility] ED for work up with IR;Hospitalization further complicated by confusion and agitation requiring Haldol early on 10/5.  Due to ongoing confusion and concern for personality change, MRI brain was obtained revealing few scattered subcentimeter acute ischemic infarcts involving the bilateral cerebral hemispheres concerning for thromboembolic etiology  and neurology was consulted for further evaluation; CXR negative. BSE generated; pt currently on Dysphagia 3/thin liquids.    Assessment / Plan / Recommendation  Clinical Impression  Pt seen for clinical swallowing evaluation with normal oropharyngeal swallow observed throughout trials of thin via cup/straw, puree and soft solids.  Pt with adequate mastication, timely swallow and no indication of oral/pharyngeal residue during intake of all consistencies.  Mentation improved per chart review as pt was oriented x4 and able to provide detailed personal information re: medical dx/history.  Recommend continuing current diet (per pt request) of Dysphagia 3/thin liquids with general swallowing precautions in place.  Recommend f/u with ST if cognitive/linguistic changes persist in outpatient or home health setting.  ST will s/o in acute setting.  Thank you for this consult. SLP Visit Diagnosis: Dysphagia, unspecified (R13.10)    Aspiration Risk  No limitations    Diet Recommendation   Dysphagia 3(mechanical/soft)/thin liquids (per pt request for D3)  Medication Administration: Whole meds with liquid    Other  Recommendations Oral Care Recommendations: Oral care BID    Recommendations for follow up therapy are one component of a multi-disciplinary discharge planning process, led by the attending physician.  Recommendations may be updated based on patient status, additional functional criteria and insurance authorization.  Follow up Recommendations No SLP follow up      Assistance Recommended at Discharge Other (comment) (TBD)  Functional Status Assessment Patient has had a recent decline in their functional status and demonstrates the ability to make significant improvements in function in a reasonable and predictable amount of time.  Frequency and Duration  Other (Comment) (evaluation only)          Prognosis Prognosis for Safe Diet Advancement: Good      Swallow Study   General Date of  Onset: 02/25/22 HPI: Kaitlin Gomez is a 87 y.o. female with medical history significant of chron's disease s/p sigmoid colectomy with a colovesical fistula repair on 08/09/1993, T2DM, HLD, HTN who presented to ED with complaints of multiple bright red bloody stools that started this AM. She went to [facility] and was found to have active bleed in her ascending colon and was sent to [facility] ED for work up with IR;Hospitalization further complicated by confusion and agitation requiring Haldol early on 10/5.  Due to ongoing confusion and concern for personality change, MRI brain was obtained revealing few scattered subcentimeter acute ischemic infarcts involving the bilateral cerebral hemispheres concerning for thromboembolic etiology and neurology was consulted for further evaluation; CXR negative. BSE generated; pt currently on Dysphagia 3/thin liquids. Type of Study: Bedside Swallow Evaluation Previous Swallow Assessment: n/a Diet Prior to this Study: Dysphagia 3 (soft);Thin liquids Temperature Spikes Noted: No Respiratory Status: Room air History of Recent Intubation: No Behavior/Cognition: Alert;Cooperative;Pleasant mood Oral Cavity Assessment: Within Functional Limits Oral Care Completed by SLP: No (pt consuming breakfast tray when SLP arrived) Oral Cavity - Dentition: Adequate natural dentition;Missing dentition Vision: Functional for self-feeding Self-Feeding Abilities: Able to feed self Patient Positioning: Upright in bed Baseline Vocal Quality: Normal Volitional Cough: Strong Volitional Swallow: Able to elicit    Oral/Motor/Sensory Function Overall Oral Motor/Sensory Function: Within functional limits   Ice Chips Ice chips: Not tested   Thin Liquid Thin Liquid: Within functional limits Presentation: Straw    Nectar Thick Nectar Thick Liquid: Not tested   Honey Thick Honey Thick Liquid: Not tested   Puree Puree: Not tested   Solid     Solid: Within functional limits Presentation: Self Fed       Elvina Sidle, M.S.,CCC-SLP 03/05/2022,9:10 AM

## 2022-03-05 NOTE — Progress Notes (Signed)
PROGRESS NOTE        PATIENT DETAILS Name: Kaitlin Gomez Age: 87 y.o. Sex: female Date of Birth: 1934-11-13 Admit Date: 02/25/2022 Admitting Physician Orma Flaming, MD YJE:HUDJS, Connye Burkitt, MD  Brief Summary: Patient is a 87 y.o.  female history of Crohn's disease-s/p sigmoid colectomy, colovesical fistula repair 1995, DM-2, HTN, HLD who presented with lower GI bleeding (likely diverticular) with acute blood loss anemia.  She was evaluated by IR underwent coil embolization.  Further hospital course complicated by acute CVA.  See below for further details.  Significant events: 10/1>> admit to [facility]-likely diverticular bleeding.  Embolization by IR. 10/5>> delirium/confusion 10/8>> MRI brain positive for acute CVA.  Significant studies: 10/1>> A1c: 6.3 10/1>> CT angio abdomen/pelvis: Bleeding in the ascending colon. 10/2>> Echo: EF 60-65% 10/6>> CT head: No acute intracranial abnormality 10/7>> CXR: No PNA 10/7>> MRI brain: Scattered subcentimeter ischemic infarcts in bilateral cerebral hemispheres. 10/7>> MRA head: Advanced intracranial atherosclerosis. 10/8>> carotid duplex: Left ICA-80-99% stenosis, right ICA 1-29% stenosis. 10/8>> LDL: 65  Significant microbiology data:   Procedures: 10/1>> mesenteric angiogram/call embolization by IR.  Consults: None  Subjective: Lying comfortably in bed-denies any chest pain or shortness of breath.  Objective: Vitals: Blood pressure (!) 158/58, pulse 97, temperature 97.6 F (36.4 C), temperature source Oral, resp. rate 16, SpO2 99 %.   Exam: Gen Exam:Alert awake-not in any distress HEENT:atraumatic, normocephalic Chest: B/L clear to auscultation anteriorly CVS:S1S2 regular Abdomen:soft non tender, non distended Extremities:no edema Neurology: Non focal Skin: no rash  Pertinent Labs/Radiology:    Latest Ref Rng & Units 03/05/2022    5:43 AM 03/04/2022    4:40 AM 03/03/2022    7:20 PM  CBC  WBC 4.0  - 10.5 K/uL 7.8  8.7  11.0   Hemoglobin 12.0 - 15.0 g/dL 8.4  7.9  9.2   Hematocrit 36.0 - 46.0 % 25.0  23.8  28.7   Platelets 150 - 400 K/uL 325  281  310     Lab Results  Component Value Date   NA 140 03/05/2022   K 3.8 03/05/2022   CL 103 03/05/2022   CO2 25 03/05/2022      Assessment/Plan: Lower GI bleeding with acute blood loss anemia Etiology diverticular bleeding-s/p coil embolization by IR on 10/1 No bleeding for more than 3-4 days Hemoglobin stable-Did not require PRBC transfusion  AKI Hemodynamically mediated Resolved  Acute CVA No focal deficits-mostly incidental finding as MRI was done to evaluate delirium/confusion. ?  Embolic etiology Dr. Waldron Labs discussed with IR-and has been started on ASA Work-up as above Await input from stroke MD  Left ICA stenosis (80-99%) Appears asymptomatic-as CVA is bilateral. On ASA/statin Will require VVS consult at some point  Delirium Stable this morning-not confused answering questions appropriately.  Urinary retention Resolved-Foley catheter removed 10/8  Sinus bradycardia TSH stable Atenolol/clonidine on hold Telemetry monitoring  HTN BP stable on lisinopril  HLD On statin  DM-2 CBG stable with SSI  Recent Labs    03/04/22 2124 03/05/22 0818 03/05/22 1217  GLUCAP 156* 118* 170*    History of Crohn's disease s/p sigmoid colectomy  BMI: Estimated body mass index is 29.6 kg/m as calculated from the following:   Height as of an earlier encounter on 02/25/22: '5\' 7"'$  (1.702 m).   Weight as of an earlier encounter on 02/25/22: 85.7 kg.  Code status:   Code Status: Full Code   DVT Prophylaxis: heparin injection 5,000 Units Start: 03/04/22 1400 Place and maintain sequential compression device Start: 03/02/22 1622 SCDs Start: 02/25/22 1516    Family Communication: None at bedside   Disposition Plan: Status is: Inpatient Remains inpatient appropriate because: Acute CVA-work-up in progress    Planned Discharge Destination:Home health   Diet: Diet Order             DIET DYS 3 Room service appropriate? Yes; Fluid consistency: Thin  Diet effective now                     Antimicrobial agents: Anti-infectives (From admission, onward)    None        MEDICATIONS: Scheduled Meds:  aspirin EC  81 mg Oral Daily   furosemide  40 mg Oral Daily   heparin injection (subcutaneous)  5,000 Units Subcutaneous Q8H   insulin aspart  0-9 Units Subcutaneous TID WC   lisinopril  40 mg Oral Daily   pravastatin  40 mg Oral q1800   senna-docusate  1 tablet Oral BID   Continuous Infusions: PRN Meds:.acetaminophen **OR** acetaminophen, haloperidol lactate, hydrALAZINE, ondansetron (ZOFRAN) IV   I have personally reviewed following labs and imaging studies  LABORATORY DATA: CBC: Recent Labs  Lab 03/02/22 0929 03/03/22 0326 03/03/22 1920 03/04/22 0440 03/05/22 0543  WBC 11.7* 8.7 11.0* 8.7 7.8  HGB 9.0* 8.1* 9.2* 7.9* 8.4*  HCT 27.5* 25.0* 28.7* 23.8* 25.0*  MCV 95.8 95.1 97.0 94.8 93.6  PLT 283 261 310 281 008    Basic Metabolic Panel: Recent Labs  Lab 02/27/22 0426 03/02/22 0929 03/03/22 0326 03/04/22 0440 03/05/22 0543  NA 141 137 137 135 140  K 3.5 3.1* 3.6 3.8 3.8  CL 107 102 101 99 103  CO2 27 21* '26 24 25  '$ GLUCOSE 117* 138* 106* 118* 133*  BUN '13 19 16 20 16  '$ CREATININE 0.91 1.38* 1.25* 1.06* 0.93  CALCIUM 8.6* 8.9 9.2 9.3 9.5    GFR: Estimated Creatinine Clearance: 49.7 mL/min (by C-G formula based on SCr of 0.93 mg/dL).  Liver Function Tests: No results for input(s): "AST", "ALT", "ALKPHOS", "BILITOT", "PROT", "ALBUMIN" in the last 168 hours. No results for input(s): "LIPASE", "AMYLASE" in the last 168 hours. Recent Labs  Lab 03/02/22 0929  AMMONIA 42*    Coagulation Profile: No results for input(s): "INR", "PROTIME" in the last 168 hours.  Cardiac Enzymes: No results for input(s): "CKTOTAL", "CKMB", "CKMBINDEX", "TROPONINI" in the  last 168 hours.  BNP (last 3 results) No results for input(s): "PROBNP" in the last 8760 hours.  Lipid Profile: Recent Labs    03/04/22 0440  CHOL 118  HDL 41  LDLCALC 65  TRIG 60  CHOLHDL 2.9    Thyroid Function Tests: No results for input(s): "TSH", "T4TOTAL", "FREET4", "T3FREE", "THYROIDAB" in the last 72 hours.  Anemia Panel: No results for input(s): "VITAMINB12", "FOLATE", "FERRITIN", "TIBC", "IRON", "RETICCTPCT" in the last 72 hours.  Urine analysis:    Component Value Date/Time   COLORURINE YELLOW 03/01/2022 1807   APPEARANCEUR CLEAR 03/01/2022 1807   APPEARANCEUR Cloudy 10/27/2011 1403   LABSPEC 1.012 03/01/2022 1807   LABSPEC 1.017 10/27/2011 1403   PHURINE 8.0 03/01/2022 1807   GLUCOSEU NEGATIVE 03/01/2022 1807   GLUCOSEU Negative 10/27/2011 1403   HGBUR NEGATIVE 03/01/2022 1807   BILIRUBINUR NEGATIVE 03/01/2022 1807   BILIRUBINUR Negative 10/27/2011 1403   KETONESUR 20 (A) 03/01/2022 1807   PROTEINUR  NEGATIVE 03/01/2022 1807   NITRITE NEGATIVE 03/01/2022 1807   LEUKOCYTESUR NEGATIVE 03/01/2022 1807   LEUKOCYTESUR 3+ 10/27/2011 1403    Sepsis Labs: Lactic Acid, Venous No results found for: "LATICACIDVEN"  MICROBIOLOGY: No results found for this or any previous visit (from the past 240 hour(s)).  RADIOLOGY STUDIES/RESULTS: MR ANGIO HEAD WO CONTRAST  Result Date: 03/04/2022 CLINICAL DATA:  Stroke, determine source. EXAM: MRA HEAD WITHOUT CONTRAST TECHNIQUE: Angiographic images of the Circle of Willis were acquired using MRA technique without intravenous contrast. COMPARISON:  Brain MRI from yesterday FINDINGS: Anterior circulation: Extensive atheromatous irregularity of the carotid siphons. Flow reducing stenosis could be present at the posterior genu, appearance likely accentuated by turbulent flow. Advanced intracranial branch atherosclerosis with high-grade left M2 and right A2 segment stenoses. Negative for aneurysm or major branch occlusion. Posterior  circulation: The vertebral and basilar arteries are diffusely patent. Extensive heterogeneity of the posterior cerebral and left PICA branches. No major branch occlusion or aneurysm IMPRESSION: Advanced intracranial atherosclerosis with dominant narrowings listed above. No emergent finding. Electronically Signed   By: Jorje Guild M.D.   On: 03/04/2022 13:03   VAS US CAROTID  Result Date: 03/04/2022 Carotid Arterial Duplex Study Patient Name:  NUSAYBAH IVIE  Date of Exam:   03/04/2022 Medical Rec #: [id number]       Accession #:    [id number] Date of Birth: 21-May-1935       Patient Gender: F Patient Age:   19 years Exam Location:  [facility] Procedure:      VAS US CAROTID Referring Phys: Anibal Henderson --------------------------------------------------------------------------------  Indications:       CVA and New altered mental status. Risk Factors:      Hypertension, hyperlipidemia, Diabetes. Other Factors:     Admitted for GI Bleed. Comparison Study:  No prior study on file Performing Technologist: Sharion Dove RVS  Examination Guidelines: A complete evaluation includes B-mode imaging, spectral Doppler, color Doppler, and power Doppler as needed of all accessible portions of each vessel. Bilateral testing is considered an integral part of a complete examination. Limited examinations for reoccurring indications may be performed as noted.  Right Carotid Findings: +----------+--------+--------+--------+------------------+------------------+           PSV cm/sEDV cm/sStenosisPlaque DescriptionComments           +----------+--------+--------+--------+------------------+------------------+ CCA Prox  103     17                                                   +----------+--------+--------+--------+------------------+------------------+ CCA Distal104     16                                intimal thickening +----------+--------+--------+--------+------------------+------------------+  ICA Prox  96      28              heterogenous                         +----------+--------+--------+--------+------------------+------------------+ ICA Mid   101     27      1-39%   heterogenous                         +----------+--------+--------+--------+------------------+------------------+ ICA Distal114     37  heterogenous                         +----------+--------+--------+--------+------------------+------------------+ ECA       100     2                                                    +----------+--------+--------+--------+------------------+------------------+ +----------+--------+-------+--------+-------------------+           PSV cm/sEDV cmsDescribeArm Pressure (mmHG) +----------+--------+-------+--------+-------------------+ [id number]                                         +----------+--------+-------+--------+-------------------+ +---------+--------+--+--------+--+ VertebralPSV cm/s71EDV cm/s16 +---------+--------+--+--------+--+  Left Carotid Findings: +----------+--------+--------+--------+------------------+--------+           PSV cm/sEDV cm/sStenosisPlaque DescriptionComments +----------+--------+--------+--------+------------------+--------+ CCA Prox  115     17              calcific                   +----------+--------+--------+--------+------------------+--------+ CCA Distal79      18                                         +----------+--------+--------+--------+------------------+--------+ ICA Prox  333     107     80-99%  calcific                   +----------+--------+--------+--------+------------------+--------+ ICA Mid   221     53                                         +----------+--------+--------+--------+------------------+--------+ ICA Distal192     43                                         +----------+--------+--------+--------+------------------+--------+ ECA        221     44                                         +----------+--------+--------+--------+------------------+--------+ +----------+--------+--------+--------+-------------------+           PSV cm/sEDV cm/sDescribeArm Pressure (mmHG) +----------+--------+--------+--------+-------------------+ [id number]                                          +----------+--------+--------+--------+-------------------+ +---------+--------+--+--------+--+ VertebralPSV cm/s65EDV cm/s17 +---------+--------+--+--------+--+   Summary: Right Carotid: Velocities in the right ICA are consistent with a 1-39% stenosis. Left Carotid: Velocities in the left ICA are consistent with a 80-99% stenosis. Vertebrals:  Bilateral vertebral arteries demonstrate antegrade flow. Subclavians: Normal flow hemodynamics were seen in bilateral subclavian              arteries. *See table(s) above for measurements and observations.  Electronically signed by Antony Contras MD on 03/04/2022 at 12:36:39 PM.  Final    MR BRAIN WO CONTRAST  Result Date: 03/03/2022 CLINICAL DATA:  Initial evaluation for altered mental status. EXAM: MRI HEAD WITHOUT CONTRAST TECHNIQUE: Multiplanar, multiecho pulse sequences of the brain and surrounding structures were obtained without intravenous contrast. COMPARISON:  Prior head CT from 03/02/2022. FINDINGS: Brain: Cerebral volume within normal limits. Scattered patchy T2/FLAIR hyperintensity involving the periventricular deep white matter both cerebral hemispheres, most consistent with chronic microvascular ischemic disease, mild for age. Few scattered small chronic cerebellar infarcts noted. Few scattered foci of restricted diffusion seen involving the bilateral cerebral hemispheres (series 3, images 34, 28, 23), consistent with acute ischemic infarcts. Largest of these foci seen at the left temporal region and measures 8 mm. No associated hemorrhage or mass effect. Gray-white matter differentiation  otherwise maintained. No acute or chronic intracranial hemorrhage. No mass lesion, midline shift or mass effect. No hydrocephalus or extra-axial fluid collection. Cavum et septum pellucidum noted. Pituitary gland and suprasellar region normal. Incidental note made of a tiny subcentimeter benign appearing pineal cyst. Vascular: Major intracranial vascular flow voids are maintained. Skull and upper cervical spine: Craniocervical junction with normal limits. Bone marrow signal intensity normal. Hyperostosis frontalis interna noted. No scalp soft tissue abnormality. Sinuses/Orbits: Prior ocular lens replacement on the right. Chronic right sphenoid sinusitis noted. Paranasal sinuses are otherwise largely clear. No mastoid effusion. Other: None. IMPRESSION: 1. Few scattered subcentimeter acute ischemic infarcts involving the bilateral cerebral hemispheres as above. No associated hemorrhage or mass effect. A central thromboembolic etiology is likely given the various vascular distributions involved. 2. Underlying mild chronic microvascular ischemic disease for age, with a few scattered small chronic cerebellar infarcts. 3. Chronic right sphenoid sinusitis. Electronically Signed   By: Jeannine Boga M.D.   On: 03/03/2022 19:54   DG Chest Port 1 View  Result Date: 03/03/2022 CLINICAL DATA:  Altered mental status EXAM: PORTABLE CHEST 1 VIEW COMPARISON:  Portable exam 1607 hours compared to 01/23/2015 FINDINGS: Enlargement of cardiac silhouette. Mediastinal contours and pulmonary vascularity normal. Minimal linear bibasilar scarring Lungs otherwise clear. No pulmonary infiltrate, pleural effusion, or pneumothorax. Bones demineralized. IMPRESSION: No acute abnormalities. Electronically Signed   By: Lavonia Dana M.D.   On: 03/03/2022 16:19     LOS: 8 days   Oren Binet, MD  [facility]    To contact the attending provider between 7A-7P or the covering provider during after hours 7P-7A, please log  into the web site [email or web address] and access using universal [place] password for that web site. If you do not have the password, please call the hospital operator.  03/05/2022, 1:20 PM

## 2022-03-05 NOTE — Progress Notes (Signed)
Occupational Therapy Re-evaluation  Patient Details Name: Kaitlin Gomez MRN: [id number] DOB: 03-10-1935 Today's Date: 03/05/2022   History of present illness 87 y/o female admitted as a tranfer from [facility] to ED 02/25/22 for work up in IR for active bleed in her ascending colon.  S/p US guided embolization of the vasa recta supplying pt's diverticular bleed right colon on 10/1. 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 PMHx: DM2, HLD, HTN, chron's ds s/p sigmoid colectormy.   OT comments  Upon arrival, pt supine in bed and agreeable to therapy. Pt presenting with decreased processing, problem solving, and activity tolerance compared to OT evaluation (which this therapist completed). Pt performing grooming at sink (only washing her face) and her HR elevating to 120s-130s. Pt requiring seated rest break for it to decrease to 90-110s. Pt performing short distance walk in room and HR elevating to 160s; again requiring seated rest break for HR to lower to 120s. This is a decreased in occupational performance compared to eval - when pt was able to walk in hallway and maintain conversation throughout. Pt also reporting "when I laugh, my head hurts" and points to her forehead. Pending her progress, pt may benefit form post-acute rehab. Will continue to follow acutely as admitted.    Recommendations for follow up therapy are one component of a multi-disciplinary discharge planning process, led by the attending physician.  Recommendations may be updated based on patient status, additional functional criteria and insurance authorization.    Follow Up Recommendations  No OT follow up (Pending progress, may need post-acute rehab)    Assistance Recommended at Discharge Intermittent Supervision/Assistance  Patient can return home with the following  Assistance with cooking/housework   Equipment Recommendations  Other (comment) (Would  benefit from shower chair; pt declining)    Recommendations for Other Services      Precautions / Restrictions Precautions Precautions: Fall       Mobility Bed Mobility Overal bed mobility: Needs Assistance Bed Mobility: Supine to Sit     Supine to sit: Min guard, HOB elevated     General bed mobility comments: increased time and effort    Transfers Overall transfer level: Needs assistance Equipment used: Rolling walker (2 wheels) Transfers: Sit to/from Stand Sit to Stand: Min guard           General transfer comment: Min Guard A for safety     Balance Overall balance assessment: Needs assistance Sitting-balance support: No upper extremity supported, Feet supported Sitting balance-Leahy Scale: Fair     Standing balance support: No upper extremity supported, During functional activity Standing balance-Leahy Scale: Fair                             ADL either performed or assessed with clinical judgement   ADL Overall ADL's : Needs assistance/impaired     Grooming: Wash/dry face;Min guard;Standing Grooming Details (indicate cue type and reason): While standing at sink, pt presenting with fatigue and HR elevating to 120-130s with simple task.                 Toilet Transfer: Min guard;Ambulation;Rolling walker (2 wheels) Toilet Transfer Details (indicate cue type and reason): simulated to recliner         Functional mobility during ADLs: Minimal assistance;Rolling walker (2 wheels) General ADL Comments: Pt presenting with decreased processing and activity tolerance.    Extremity/Trunk Assessment Upper Extremity  Assessment Upper Extremity Assessment: Overall WFL for tasks assessed   Lower Extremity Assessment Lower Extremity Assessment: Defer to PT evaluation        Vision       Perception     Praxis      Cognition Arousal/Alertness: Awake/alert Behavior During Therapy: Flat affect Overall Cognitive Status:  Impaired/Different from baseline Area of Impairment: Following commands, Problem solving, Awareness, Attention                   Current Attention Level: Selective, Sustained   Following Commands: Follows one step commands with increased time   Awareness: Intellectual, Emergent Problem Solving: Slow processing, Requires verbal cues General Comments: Pt is known to this therapist from OT evaluation last week. Pt presenting this session with decreased processing and requiring increased time for responding. Noting pt having to pause to process her thought before answering.        Exercises      Shoulder Instructions       General Comments HR elevating to 146 duirng walk from sink to recliner. Taking rest break. HR elevating to 169. BP 131/80 (96)    Pertinent Vitals/ Pain       Pain Assessment Pain Assessment: No/denies pain Pain Location: Reports pain when she laughs "across her forehead" Pain Intervention(s): Monitored during session  Home Living                                          Prior Functioning/Environment              Frequency  Min 3X/week        Progress Toward Goals  OT Goals(current goals can now be found in the care plan section)  Progress towards OT goals: Not progressing toward goals - comment (HR elevating too high for continued activity)  Acute Rehab OT Goals OT Goal Formulation: With patient Time For Goal Achievement: 03/13/22 Potential to Achieve Goals: Good ADL Goals Pt Will Perform Lower Body Dressing: with modified independence;sit to/from stand Pt Will Transfer to Toilet: with modified independence;ambulating;regular height toilet Pt Will Perform Toileting - Clothing Manipulation and hygiene: with modified independence;sitting/lateral leans;sit to/from stand Pt Will Perform Tub/Shower Transfer: Tub transfer;with modified independence;ambulating;rolling walker Additional ADL Goal #1: Pt will demonstrate  increased activity tolerance to perform four ADLs standing at sink with Supervision  Plan Discharge plan remains appropriate    Co-evaluation                 AM-PAC OT "6 Clicks" Daily Activity     Outcome Measure   Help from another person eating meals?: A Little Help from another person taking care of personal grooming?: A Little Help from another person toileting, which includes using toliet, bedpan, or urinal?: A Little Help from another person bathing (including washing, rinsing, drying)?: A Little Help from another person to put on and taking off regular upper body clothing?: A Little Help from another person to put on and taking off regular lower body clothing?: A Little 6 Click Score: 18    End of Session Equipment Utilized During Treatment: Rolling walker (2 wheels)  OT Visit Diagnosis: Unsteadiness on feet (R26.81);Other abnormalities of gait and mobility (R26.89);Muscle weakness (generalized) (M62.81)   Activity Tolerance Patient tolerated treatment well   Patient Left in chair;with call bell/phone within reach;with chair alarm set   Nurse Communication Mobility status  Time: 9753-0051 OT Time Calculation (min): 39 min  Charges: OT General Charges $OT Visit: 1 Visit OT Evaluation $OT Re-eval: 1 Re-eval OT Treatments $Self Care/Home Management : 8-22 mins  Teniqua Marron MSOT, OTR/L Acute Rehab Office: [place] 03/05/2022, 5:20 PM

## 2022-03-05 NOTE — Progress Notes (Signed)
PT Cancellation Note  Patient Details Name: Kaitlin Gomez MRN: [id number] DOB: 1935/02/04   Cancelled Treatment:    Reason Eval/Treat Not Completed: (P) Medical issues which prohibited therapy Pt awaiting LE Doppler study. Heparin started at 1355 03/04/22. PT will follow back when results of Doppler study are complete or heparin has been given for 24 hours whichever is first.  Benjamine Mola B. Migdalia Dk PT, DPT Acute Rehabilitation Services Please use secure chat or  Call Office [phone]    [place] 03/05/2022, 11:39 AM

## 2022-03-05 NOTE — Progress Notes (Signed)
Mobility Specialist Progress Note   03/05/22 1513  Mobility  Activity Contraindicated/medical hold   Pt not appropriate for mobility at this time given level of complexity, physical assist, and/or precautions as advised by MD. Mobility specialist to hold today, will continue to follow.  Kaitlin Gomez, [place], [place]  JGZQJ:[phone] Office: [phone]

## 2022-03-06 ENCOUNTER — Inpatient Hospital Stay (HOSPITAL_COMMUNITY): Payer: Medicare HMO

## 2022-03-06 DIAGNOSIS — K5793 Diverticulitis of intestine, part unspecified, without perforation or abscess with bleeding: Secondary | ICD-10-CM

## 2022-03-06 DIAGNOSIS — E119 Type 2 diabetes mellitus without complications: Secondary | ICD-10-CM

## 2022-03-06 DIAGNOSIS — I6522 Occlusion and stenosis of left carotid artery: Secondary | ICD-10-CM

## 2022-03-06 LAB — BASIC METABOLIC PANEL
Anion gap: 11 (ref 5–15)
BUN: 19 mg/dL (ref 8–23)
CO2: 26 mmol/L (ref 22–32)
Calcium: 9.3 mg/dL (ref 8.9–10.3)
Chloride: 103 mmol/L (ref 98–111)
Creatinine, Ser: 1.12 mg/dL — ABNORMAL HIGH (ref 0.44–1.00)
GFR, Estimated: 48 mL/min — ABNORMAL LOW (ref >60)
Glucose, Bld: 137 mg/dL — ABNORMAL HIGH (ref 70–99)
Potassium: 3.7 mmol/L (ref 3.5–5.1)
Sodium: 140 mmol/L (ref 135–145)

## 2022-03-06 LAB — GLUCOSE, CAPILLARY
Glucose-Capillary: 144 mg/dL — ABNORMAL HIGH (ref 70–99)
Glucose-Capillary: 237 mg/dL — ABNORMAL HIGH (ref 70–99)
Glucose-Capillary: 190 mg/dL — ABNORMAL HIGH (ref 70–99)
Glucose-Capillary: 172 mg/dL — ABNORMAL HIGH (ref 70–99)

## 2022-03-06 LAB — CBC
HCT: 26.6 % — ABNORMAL LOW (ref 36.0–46.0)
Hemoglobin: 8.8 g/dL — ABNORMAL LOW (ref 12.0–15.0)
MCH: 31 pg (ref 26.0–34.0)
MCHC: 33.1 g/dL (ref 30.0–36.0)
MCV: 93.7 fL (ref 80.0–100.0)
Platelets: 384 10*3/uL (ref 150–400)
RBC: 2.84 MIL/uL — ABNORMAL LOW (ref 3.87–5.11)
RDW: 15.4 % (ref 11.5–15.5)
WBC: 10.3 10*3/uL (ref 4.0–10.5)
nRBC: 0.6 % — ABNORMAL HIGH (ref 0.0–0.2)

## 2022-03-06 MED ORDER — METOPROLOL TARTRATE 50 MG PO TABS
50.0000 mg | ORAL_TABLET | Freq: Two times a day (BID) | ORAL | Status: DC
  Administered 2022-03-06 – 2022-03-07 (×2): 50 mg via ORAL
  Filled 2022-03-06 (×2): qty 1

## 2022-03-06 MED ORDER — METOPROLOL TARTRATE 25 MG PO TABS
25.0000 mg | ORAL_TABLET | Freq: Two times a day (BID) | ORAL | Status: DC
  Administered 2022-03-06: 25 mg via ORAL
  Filled 2022-03-06: qty 1

## 2022-03-06 NOTE — Consult Note (Addendum)
Hospital Consult  VASCULAR SURGERY ASSESSMENT & PLAN:   ASYMPTOMATIC LEFT CAROTID STENOSIS: I would agree with neurology that the patient's left carotid stenosis is likely asymptomatic.  End-diastolic velocity on the duplex is 105 cm/s so I am not convinced that this is greater than 80%.  She has extremely limited neck mobility I am not sure technically she would be a candidate for left carotid endarterectomy.  Likewise for the same reason she may not be a candidate for TCAR.  Regardless, if the stenosis is less than 80%, given her age, I would certainly not recommend an aggressive approach.  She is on aspirin and on a statin.  She is not a smoker.  I have ordered a CT angio of the neck to further assess her left carotid stenosis.  I will make further recommendations pending these results.  If the stenosis is greater than 80% dense we can consider options for elective revascularization.  Gae Gallop, MD 2:28 PM   Reason for Consult:  Asymptomatic left carotid artery stenosis Requesting Physician:  Oren Binet MD MRN #:  [id number]  History of Present Illness: Kaitlin Gomez is a 87 y.o. female with a PMH of Chron's disease s/p sigmoid colectomy, T2DM, HLD, HTN who presented to the hospital on 02/25/2022 with complaints of multiple bright red bloody stools.  She was found to have a diverticular bleed in her ascending colon, which required coil embolization by IR on 02/25/2022.  She developed significant confusion on the night of 10/4.  EEG with no acute findings, CT head with no acute findings.  MRI revealed acute ischemic infarcts involving bilateral cerebral hemispheres and neurology was consulted.  Duplex study of carotid arteries demonstrates borderline 85-63% LICA stenosis. Etiology of stroke potentially cardioembolic or due to left carotid artery stenosis.  The patient denies any history of stroke or TIA.  She denies any weakness or numbness, amaurosis fugax, slurred speech, or confusion.    She denies history of tobacco use.  Past Medical History:  Diagnosis Date   Crohn's disease ([place])    Diabetes mellitus without complication ([place])    Hyperlipemia    Hypertension     Past Surgical History:  Procedure Laterality Date   COLONOSCOPY WITH PROPOFOL N/A 01/05/2015   Procedure: COLONOSCOPY WITH PROPOFOL;  Surgeon: Lollie Sails, MD;  Location: [facility] ENDOSCOPY;  Service: Endoscopy;  Laterality: N/A;   COLONOSCOPY WITH PROPOFOL N/A 11/02/2016   Procedure: COLONOSCOPY WITH PROPOFOL;  Surgeon: Jonathon Bellows, MD;  Location: [facility] ENDOSCOPY;  Service: Endoscopy;  Laterality: N/A;   IR ANGIOGRAM SELECTIVE EACH ADDITIONAL VESSEL  02/25/2022   IR ANGIOGRAM SELECTIVE EACH ADDITIONAL VESSEL  02/25/2022   IR ANGIOGRAM SELECTIVE EACH ADDITIONAL VESSEL  02/25/2022   IR ANGIOGRAM VISCERAL SELECTIVE  02/25/2022   IR EMBO ART  VEN HEMORR LYMPH EXTRAV  INC GUIDE ROADMAPPING  02/25/2022   IR US GUIDE VASC ACCESS RIGHT  02/25/2022    Allergies  Allergen Reactions   Advil [Ibuprofen] Rash    Prior to Admission medications   Medication Sig Start Date End Date Taking? Authorizing Provider  amLODipine (NORVASC) 10 MG tablet Take 10 mg by mouth daily.   Yes [provider]  aspirin EC 81 MG tablet Take 81 mg by mouth daily. Swallow whole.   Yes [provider]  atenolol (TENORMIN) 50 MG tablet Take 50 mg by mouth daily.   Yes [provider]  calcium-vitamin D (OSCAL 500/200 D-3) 500-200 MG-UNIT per tablet Take 1 tablet by mouth daily.  Yes [provider]  cloNIDine (CATAPRES) 0.3 MG tablet Take 0.3 mg by mouth at bedtime.    Yes [provider]  furosemide (LASIX) 40 MG tablet Take 40 mg by mouth daily.   Yes [provider]  lovastatin (MEVACOR) 40 MG tablet Take 40 mg by mouth at bedtime.   Yes [provider]  metFORMIN (GLUCOPHAGE-XR) 500 MG 24 hr tablet Take 500 mg by mouth daily. 10/09/16  Yes [provider]  quinapril  (ACCUPRIL) 40 MG tablet Take 40 mg by mouth 2 (two) times daily.   Yes [provider]    Social History   Socioeconomic History   Marital status: Widowed    Spouse name: Not on file   Number of children: Not on file   Years of education: Not on file   Highest education level: Not on file  Occupational History   Not on file  Tobacco Use   Smoking status: Never   Smokeless tobacco: Never  Substance and Sexual Activity   Alcohol use: No   Drug use: No   Sexual activity: Not on file  Other Topics Concern   Not on file  Social History Narrative   Not on file   Social Determinants of Health   Financial Resource Strain: Not on file  Food Insecurity: No Food Insecurity (02/26/2022)   Hunger Vital Sign    Worried About Running Out of Food in the Last Year: Never true    Ran Out of Food in the Last Year: Never true  Transportation Needs: No Transportation Needs (02/26/2022)   PRAPARE - Hydrologist (Medical): No    Lack of Transportation (Non-Medical): No  Physical Activity: Not on file  Stress: Not on file  Social Connections: Not on file  Intimate Partner Violence: Not At Risk (02/26/2022)   Humiliation, Afraid, Rape, and Kick questionnaire    Fear of Current or Ex-Partner: No    Emotionally Abused: No    Physically Abused: No    Sexually Abused: No     Family History  Problem Relation Age of Onset   Hypertension Father     ROS: Otherwise negative unless mentioned in HPI  Physical Examination  Vitals:   03/06/22 0841 03/06/22 1201  BP: (!) 160/76   Pulse: 98 95  Resp: (!) 24   Temp: 98.8 F (37.1 C) 98.1 F (36.7 C)  SpO2:     There is no height or weight on file to calculate BMI.  General:  WDWN in NAD Gait: Not observed HENT: WNL, normocephalic Pulmonary: normal non-labored breathing, without Rales, rhonchi,  wheezing Cardiac: regular rate and rhythm, with left carotid bruit Abdomen:  soft, NT/ND, no masses Skin:  without rashes Vascular Exam/Pulses: Palpable pedal pulses Extremities: without ischemic changes, without Gangrene , without cellulitis; without open wounds;  Musculoskeletal: no muscle wasting or atrophy  Neurologic: A&O X 3;  No focal weakness or paresthesias are detected; speech is fluent/normal Psychiatric:  The pt has Normal affect. Lymph:  Unremarkable  CBC    Component Value Date/Time   WBC 10.3 03/06/2022 0118   RBC 2.84 (L) 03/06/2022 0118   HGB 8.8 (L) 03/06/2022 0118   HCT 26.6 (L) 03/06/2022 0118   PLT 384 03/06/2022 0118   MCV 93.7 03/06/2022 0118   MCH 31.0 03/06/2022 0118   MCHC 33.1 03/06/2022 0118   RDW 15.4 03/06/2022 0118   LYMPHSABS 1.6 02/26/2022 0825   MONOABS 0.6 02/26/2022 0825  EOSABS 0.0 02/26/2022 0825   BASOSABS 0.0 02/26/2022 0825    BMET    Component Value Date/Time   NA 140 03/06/2022 0118   K 3.7 03/06/2022 0118   CL 103 03/06/2022 0118   CO2 26 03/06/2022 0118   GLUCOSE 137 (H) 03/06/2022 0118   BUN 19 03/06/2022 0118   CREATININE 1.12 (H) 03/06/2022 0118   CALCIUM 9.3 03/06/2022 0118   GFRNONAA 48 (L) 03/06/2022 0118   GFRAA >60 11/02/2016 0508    COAGS: Lab Results  Component Value Date   INR 1.2 02/25/2022   INR 1.10 10/30/2016   INR 1.33 01/05/2015     Non-Invasive Vascular Imaging:   Carotid duplex study demonstrates borderline 80-99% stenosis of the left internal carotid artery. 1-39% stenosis of the right internal carotid artery  Statin:  Yes.   Beta Blocker:  Yes.   Aspirin:  Yes.   ACEI:  No. ARB:  No. CCB use:  Yes Other antiplatelets/anticoagulants:  No.    ASSESSMENT/PLAN: This is a 87 y.o. female with symptomatic left carotid artery stenosis   -MRI revealed acute bilateral infarcts. Etiology potentially cardioembolic or due to left internal carotid stenosis. -Duplex revealed borderline left internal carotid artery stenosis of 80-99% -Patient is on ASA and statin now. Currently will not tolerate AC due to  GI bleed -CT angio of head/neck will be ordered to plan for possible intervention in the next few weeks -Dr.Kanesha Cadle to see the patient and determine further steps   Vicente Serene PA-C [facility] [phone]

## 2022-03-06 NOTE — Progress Notes (Addendum)
STROKE TEAM PROGRESS NOTE   INTERVAL HISTORY Patient was seen in bed this AM. Patient has no concerns this morning.  Lower extremity venous Dopplers were negative for DVT.  Therapist recommend home therapy patient became tachycardic when ambulating hence plan is to keep her 1 more day and discharge tomorrow likely       Vitals:   03/05/22 2140 03/06/22 0000 03/06/22 0841 03/06/22 1201  BP: (!) 149/81 (!) 168/68 (!) 160/76   Pulse: (!) 115 (!) 103 98 95  Resp: 15 16 (!) 24   Temp: 98.9 F (37.2 C) 98.8 F (37.1 C) 98.8 F (37.1 C) 98.1 F (36.7 C)  TempSrc: Oral Oral Oral Oral  SpO2: 98%      CBC:  Recent Labs  Lab 03/05/22 0543 03/06/22 0118  WBC 7.8 10.3  HGB 8.4* 8.8*  HCT 25.0* 26.6*  MCV 93.6 93.7  PLT 325 244   Basic Metabolic Panel:  Recent Labs  Lab 03/05/22 0543 03/06/22 0118  NA 140 140  K 3.8 3.7  CL 103 103  CO2 25 26  GLUCOSE 133* 137*  BUN 16 19  CREATININE 0.93 1.12*  CALCIUM 9.5 9.3   Lipid Panel:  Recent Labs  Lab 03/04/22 0440  CHOL 118  TRIG 60  HDL 41  CHOLHDL 2.9  VLDL 12  LDLCALC 65   HgbA1c: No results for input(s): "HGBA1C" in the last 168 hours. Urine Drug Screen: No results for input(s): "LABOPIA", "COCAINSCRNUR", "LABBENZ", "AMPHETMU", "THCU", "LABBARB" in the last 168 hours.  Alcohol Level No results for input(s): "ETH" in the last 168 hours.  IMAGING past 24 hours VAS [place] LOWER EXTREMITY VENOUS (DVT)  Result Date: 03/05/2022  Lower Venous DVT Study Patient Name:  Kaitlin Gomez  Date of Exam:   03/05/2022 Medical Rec #: [id number]       Accession #:    [id number] Date of Birth: Dec 19, 1934       Patient Gender: F Patient Age:   87 years Exam Location:  [facility] Procedure:      VAS [place] LOWER EXTREMITY VENOUS (DVT) Referring Phys: Oren Binet --------------------------------------------------------------------------------  Indications: Stroke.  Limitations: Body habitus. Comparison Study: No prior study Performing  Technologist: Jody Hill RVT, RDMS  Examination Guidelines: A complete evaluation includes B-mode imaging, spectral Doppler, color Doppler, and power Doppler as needed of all accessible portions of each vessel. Bilateral testing is considered an integral part of a complete examination. Limited examinations for reoccurring indications may be performed as noted. The reflux portion of the exam is performed with the patient in reverse Trendelenburg.  +---------+---------------+---------+-----------+----------+--------------+ RIGHT    CompressibilityPhasicitySpontaneityPropertiesThrombus Aging +---------+---------------+---------+-----------+----------+--------------+ CFV      Full           Yes      Yes                                 +---------+---------------+---------+-----------+----------+--------------+ SFJ      Full                                                        +---------+---------------+---------+-----------+----------+--------------+ FV Prox  Full                                                        +---------+---------------+---------+-----------+----------+--------------+  FV Mid   Full                                                        +---------+---------------+---------+-----------+----------+--------------+ FV DistalFull                                                        +---------+---------------+---------+-----------+----------+--------------+ PFV      Full                                                        +---------+---------------+---------+-----------+----------+--------------+ POP      Full           No       Yes                                 +---------+---------------+---------+-----------+----------+--------------+ PTV      Full                                                        +---------+---------------+---------+-----------+----------+--------------+ PERO     Full                                                         +---------+---------------+---------+-----------+----------+--------------+   +---------+---------------+---------+-----------+----------+--------------+ LEFT     CompressibilityPhasicitySpontaneityPropertiesThrombus Aging +---------+---------------+---------+-----------+----------+--------------+ CFV      Full           Yes      Yes                                 +---------+---------------+---------+-----------+----------+--------------+ SFJ      Full                                                        +---------+---------------+---------+-----------+----------+--------------+ FV Prox  Full                                                        +---------+---------------+---------+-----------+----------+--------------+ FV Mid   Full                                                        +---------+---------------+---------+-----------+----------+--------------+  FV DistalFull                                                        +---------+---------------+---------+-----------+----------+--------------+ PFV      Full                                                        +---------+---------------+---------+-----------+----------+--------------+ POP      Full           Yes      Yes                                 +---------+---------------+---------+-----------+----------+--------------+ PTV      Full                                                        +---------+---------------+---------+-----------+----------+--------------+ PERO     Full                                                        +---------+---------------+---------+-----------+----------+--------------+     Summary: BILATERAL: - No evidence of deep vein thrombosis seen in the lower extremities, bilaterally. - RIGHT: - A cystic structure is found in the popliteal fossa.  LEFT: - A cystic structure is found in the popliteal fossa.  *See table(s) above for  measurements and observations. Electronically signed by Monica Martinez MD on 03/05/2022 at 5:35:07 PM.    Final     PHYSICAL EXAM General: Pleasant, well-appearing  in bed. No acute distress. Extremities: Normal ROM. 5/5 strength in RUE, LUE, RLE, and LLE Skin: Warm and dry. No obvious rash or lesions. Neuro: A&Ox3. CN II-XII intact. No cerebellar abnormalities. Moves all extremities. Normal sensation. No focal deficit. No dysarthria. Psych: Normal mood and affect   ASSESSMENT/PLAN Kaitlin Gomez is a 87 y.o. female with a medical history significant for Crohn's disease s/p sigmoid colectomy, remote history of GI bleed, type 2 diabetes mellitus, hyperlipidemia, and essential hypertension who initially presented to [facility] 02/25/22 for evaluation of hematochezia. Due to ongoing confusion and concern for personality change, MRI brain was obtained revealing few scattered subcentimeter acute ischemic infarcts involving the bilateral cerebral hemispheres concerning for thromboembolic etiology and neurology was consulted for further evaluation.   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 No associated hemorrhage or mass effect. A central thromboembolic etiology is likely given the various vascular distributions involved. MRA  Advanced intracranial atherosclerosis with dominant narrowings Carotid Doppler  Right Carotid: Velocities in the right ICA are consistent with a 1-39% stenosis. Left Carotid: Velocities in  the left ICA are consistent with a 80-99% stenosis.  2D Echo EF 60-65% LDL 65 HgbA1c 6.3 VTE prophylaxis - lovenox    Diet   DIET DYS 3 Room service appropriate? Yes; Fluid consistency: Thin   aspirin 81  mg daily prior to admission, now on aspirin 81 mg daily. DAPT not recommended at this time due to GI bleed risk. LE US doppler shows no DVT 30 day heart monitor recommended Elective left carotid surgery in 2 weeks due to stenosis Therapy recommendations:  home health Disposition:  home  Hypertension Home meds:  amlodipine, atenolol, clonidine, quinapril Unstable Permissive hypertension (OK if < 220/120) but gradually normalize in 5-7 days Long-term BP goal normotensive  Hyperlipidemia Home meds:  lovastatin pravastatin 40 mg started in hospital LDL 65, goal < 70 Continue statin at discharge  Prediabetes Home meds:  metformin HgbA1c 6.3, goal < 7.0 CBGs Recent Labs    03/05/22 2201 03/06/22 0837 03/06/22 1158  GLUCAP 175* 144* 237*    SSI  Other Stroke Risk Factors Advanced Age >/= 38    Other Active Problems  Diverticular bleed in ascending colon: -  now s/p coil embolization by IR Dr. Earleen Newport 10/1. Previous colonoscopy showed diverticulosis, suspected cause of painless hematochezia despite hx IBD.  - Monitor H/H as below, so far hemoglobin remained stable last BM yesterday morning with small amount of what appears to be old blood. - Do not submerge R CFA access site for 7 days. No longer requires any bedrest.  - She still having intermittent bowel movements with some old blood in it, but hemoglobin had remained stable  Sinus bradycardia,  -1st degree AVB: Improved when beta blocker held. TSH 0.811. Limited echo showed preserved LVEF and no wall motion abnormalities.  - To help with BP control, home atenolol restarted, modest bradycardia which is asymptomatic is noted.  Hypokalemia:  - repleting   Crohn's disease:  - No evidence of flare at this time.  - Suggest she reengage with GI as outpatient, hasn't been seen since 2018.    AKI -Creatinine elevated at 1.38 today, she had some urinary retention yesterday and poor oral intake, will keep on IV fluids  overnight  Hospital day # 9  Stormy Fabian, MD PGY-1 Psychiatry  I have personally obtained history,examined this patient, reviewed notes, independently viewed imaging studies, participated in medical decision making and plan of care.ROS completed by me personally and pertinent positives fully documented  I have made any additions or clarifications directly to the above note. Agree with note above.  Patient is neurologically stable.  Recommend aspirin alone for stroke prevention at this time given recent GI hemorrhage.  She also has high-grade proximal left carotid stenosis for which she can return for elective revascularization by vascular surgery in a few weeks.  Continue aggressive risk factor moderate occasion.  Stroke team will sign off.  Kindly call for questions.  Follow-up with outpatient stroke clinic with stroke NP in 2 months Antony Contras, MD Medical Director [place] Pager: [phone] 03/06/2022 1:30 PM    To contact Stroke Continuity provider, please refer to [email or web address] After hours, contact General Neurology

## 2022-03-06 NOTE — Progress Notes (Signed)
Mobility Specialist Progress Note:   03/06/22 0920  Mobility  Activity Transferred from bed to chair  Level of Assistance Minimal assist, patient does 75% or more  Assistive Device Other (Comment) (HHA)  Distance Ambulated (ft) 3 ft  Activity Response Tolerated well  Mobility Referral Yes  $Mobility charge 1 Mobility   Pt presenting with increased cognition this am, agreeable to transfer to chair to finish breakfast. Pt required minA through HHA during transfer. Had difficulty progressing RLE to pivot, however able to with incr time. Pt left in chair with all needs met, chair alarm on.  Nelta Numbers Acute Rehab Secure Chat or Office Phone: [phone]

## 2022-03-06 NOTE — Progress Notes (Signed)
PROGRESS NOTE        PATIENT DETAILS Name: Kaitlin Gomez Age: 87 y.o. Sex: female Date of Birth: 1934/08/13 Admit Date: 02/25/2022 Admitting Physician Orma Flaming, MD VVZ:SMOLM, Connye Burkitt, MD  Brief Summary: Patient is a 87 y.o.  female history of Crohn's disease-s/p sigmoid colectomy, colovesical fistula repair 1995, DM-2, HTN, HLD who presented with lower GI bleeding (likely diverticular) with acute blood loss anemia.  She was evaluated by IR underwent coil embolization.  Further hospital course complicated by acute CVA.  See below for further details.  Significant events: 10/1>> admit to [facility]-likely diverticular bleeding.  Embolization by IR. 10/5>> delirium/confusion 10/8>> MRI brain positive for acute CVA.  Significant studies: 10/1>> A1c: 6.3 10/1>> CT angio abdomen/pelvis: Bleeding in the ascending colon. 10/2>> Echo: EF 60-65% 10/6>> CT head: No acute intracranial abnormality 10/7>> CXR: No PNA 10/7>> MRI brain: Scattered subcentimeter ischemic infarcts in bilateral cerebral hemispheres. 10/7>> MRA head: Advanced intracranial atherosclerosis. 10/8>> carotid duplex: Left ICA-80-99% stenosis, right ICA 1-29% stenosis. 10/8>> LDL: 65 10/9>> lower extremity Doppler: No DVT.  Significant microbiology data:   Procedures: 10/1>> mesenteric angiogram/call embolization by IR.  Consults: None  Subjective: Felt weak when she ambulated with rehab services yesterday.  Apparently heart rate up to the 160s-apparently was sinus.  Objective: Vitals: Blood pressure (!) 160/76, pulse 98, temperature 98.8 F (37.1 C), temperature source Oral, resp. rate (!) 24, SpO2 98 %.   Exam: Gen Exam:Alert awake-not in any distress HEENT:atraumatic, normocephalic Chest: B/L clear to auscultation anteriorly CVS:S1S2 regular Abdomen:soft non tender, non distended Extremities:no edema Neurology: Non focal Skin: no rash   Pertinent Labs/Radiology:    Latest  Ref Rng & Units 03/06/2022    1:18 AM 03/05/2022    5:43 AM 03/04/2022    4:40 AM  CBC  WBC 4.0 - 10.5 K/uL 10.3  7.8  8.7   Hemoglobin 12.0 - 15.0 g/dL 8.8  8.4  7.9   Hematocrit 36.0 - 46.0 % 26.6  25.0  23.8   Platelets 150 - 400 K/uL 384  325  281     Lab Results  Component Value Date   NA 140 03/06/2022   K 3.7 03/06/2022   CL 103 03/06/2022   CO2 26 03/06/2022      Assessment/Plan: Lower GI bleeding with acute blood loss anemia Etiology diverticular bleeding-s/p coil embolization by IR on 10/1 No bleeding for more than 4 days now-had brown-colored stools yesterday. Hemoglobin stable-Did not require PRBC transfusion  AKI Hemodynamically mediated Resolved  Acute CVA No focal deficits-mostly incidental finding as MRI was done to evaluate delirium/confusion. ?  Embolic etiology-needs 78-MLJ event monitor arranged on discharge Extensive work-up as above. Dr. Waldron Labs discussed with IR-and has been started on ASA Continue aspirin/statin  Left ICA stenosis (80-99%) Appears asymptomatic-as CVA is bilateral. On ASA/statin [facility] consultation-we will require outpatient CEA.  Delirium Seems to have improved-answering questions appropriately.  Urinary retention Resolved-Foley catheter removed 10/8  Sinus bradycardia TSH stable Seems to have resolved-significantly tachycardic with PT yesterday-we will resume beta-blocker.  Clonidine on hold.  HTN BP stable-switch to metoprolol given tachycardia with therapy. Allow some permissive hypertension given significant carotid artery disease.  HLD On statin  DM-2 CBG stable with SSI  Recent Labs    03/05/22 1538 03/05/22 2201 03/06/22 0837  GLUCAP 158* 175* 144*     History of  Crohn's disease s/p sigmoid colectomy  BMI: Estimated body mass index is 29.6 kg/m as calculated from the following:   Height as of an earlier encounter on 02/25/22: '5\' 7"'$  (1.702 m).   Weight as of an earlier encounter on 02/25/22: 85.7 kg.    Code status:   Code Status: Full Code   DVT Prophylaxis: heparin injection 5,000 Units Start: 03/04/22 1400 Place and maintain sequential compression device Start: 03/02/22 1622 SCDs Start: 02/25/22 1516    Family Communication: None at bedside-claims she will update brother later.  I have asked her to let me know if I need to call any of her family members.   Disposition Plan: Status is: Inpatient Remains inpatient appropriate because: Acute CVA-work-up in progress   Planned Discharge Destination:Home health if remains stable over the next day or so.   Diet: Diet Order             DIET DYS 3 Room service appropriate? Yes; Fluid consistency: Thin  Diet effective now                     Antimicrobial agents: Anti-infectives (From admission, onward)    None        MEDICATIONS: Scheduled Meds:  aspirin EC  81 mg Oral Daily   furosemide  40 mg Oral Daily   heparin injection (subcutaneous)  5,000 Units Subcutaneous Q8H   insulin aspart  0-9 Units Subcutaneous TID WC   metoprolol tartrate  25 mg Oral BID   pravastatin  40 mg Oral q1800   senna-docusate  1 tablet Oral BID   Continuous Infusions: PRN Meds:.acetaminophen **OR** acetaminophen, haloperidol lactate, hydrALAZINE, ondansetron (ZOFRAN) IV   I have personally reviewed following labs and imaging studies  LABORATORY DATA: CBC: Recent Labs  Lab 03/03/22 0326 03/03/22 1920 03/04/22 0440 03/05/22 0543 03/06/22 0118  WBC 8.7 11.0* 8.7 7.8 10.3  HGB 8.1* 9.2* 7.9* 8.4* 8.8*  HCT 25.0* 28.7* 23.8* 25.0* 26.6*  MCV 95.1 97.0 94.8 93.6 93.7  PLT 261 310 281 325 384     Basic Metabolic Panel: Recent Labs  Lab 03/02/22 0929 03/03/22 0326 03/04/22 0440 03/05/22 0543 03/06/22 0118  NA 137 137 135 140 140  K 3.1* 3.6 3.8 3.8 3.7  CL 102 101 99 103 103  CO2 21* '26 24 25 26  '$ GLUCOSE 138* 106* 118* 133* 137*  BUN '19 16 20 16 19  '$ CREATININE 1.38* 1.25* 1.06* 0.93 1.12*  CALCIUM 8.9 9.2 9.3  9.5 9.3     GFR: Estimated Creatinine Clearance: 41.3 mL/min (A) (by C-G formula based on SCr of 1.12 mg/dL (H)).  Liver Function Tests: No results for input(s): "AST", "ALT", "ALKPHOS", "BILITOT", "PROT", "ALBUMIN" in the last 168 hours. No results for input(s): "LIPASE", "AMYLASE" in the last 168 hours. Recent Labs  Lab 03/02/22 0929  AMMONIA 42*     Coagulation Profile: No results for input(s): "INR", "PROTIME" in the last 168 hours.  Cardiac Enzymes: No results for input(s): "CKTOTAL", "CKMB", "CKMBINDEX", "TROPONINI" in the last 168 hours.  BNP (last 3 results) No results for input(s): "PROBNP" in the last 8760 hours.  Lipid Profile: Recent Labs    03/04/22 0440  CHOL 118  HDL 41  LDLCALC 65  TRIG 60  CHOLHDL 2.9     Thyroid Function Tests: No results for input(s): "TSH", "T4TOTAL", "FREET4", "T3FREE", "THYROIDAB" in the last 72 hours.  Anemia Panel: No results for input(s): "VITAMINB12", "FOLATE", "FERRITIN", "TIBC", "IRON", "RETICCTPCT" in the last  72 hours.  Urine analysis:    Component Value Date/Time   COLORURINE YELLOW 03/01/2022 1807   APPEARANCEUR CLEAR 03/01/2022 1807   APPEARANCEUR Cloudy 10/27/2011 1403   LABSPEC 1.012 03/01/2022 1807   LABSPEC 1.017 10/27/2011 1403   PHURINE 8.0 03/01/2022 1807   GLUCOSEU NEGATIVE 03/01/2022 1807   GLUCOSEU Negative 10/27/2011 1403   HGBUR NEGATIVE 03/01/2022 1807   BILIRUBINUR NEGATIVE 03/01/2022 1807   BILIRUBINUR Negative 10/27/2011 1403   KETONESUR 20 (A) 03/01/2022 1807   PROTEINUR NEGATIVE 03/01/2022 1807   NITRITE NEGATIVE 03/01/2022 1807   LEUKOCYTESUR NEGATIVE 03/01/2022 1807   LEUKOCYTESUR 3+ 10/27/2011 1403    Sepsis Labs: Lactic Acid, Venous No results found for: "LATICACIDVEN"  MICROBIOLOGY: No results found for this or any previous visit (from the past 240 hour(s)).  RADIOLOGY STUDIES/RESULTS: VAS [place] LOWER EXTREMITY VENOUS (DVT)  Result Date: 03/05/2022  Lower Venous DVT Study  Patient Name:  Kaitlin Gomez  Date of Exam:   03/05/2022 Medical Rec #: [id number]       Accession #:    [id number] Date of Birth: [birth year]       Patient Gender: F Patient Age:   39 years Exam Location:  [facility] Procedure:      VAS [place] LOWER EXTREMITY VENOUS (DVT) Referring Phys: Oren Binet --------------------------------------------------------------------------------  Indications: Stroke.  Limitations: Body habitus. Comparison Study: No prior study Performing Technologist: Jody Hill RVT, RDMS  Examination Guidelines: A complete evaluation includes B-mode imaging, spectral Doppler, color Doppler, and power Doppler as needed of all accessible portions of each vessel. Bilateral testing is considered an integral part of a complete examination. Limited examinations for reoccurring indications may be performed as noted. The reflux portion of the exam is performed with the patient in reverse Trendelenburg.  +---------+---------------+---------+-----------+----------+--------------+ RIGHT    CompressibilityPhasicitySpontaneityPropertiesThrombus Aging +---------+---------------+---------+-----------+----------+--------------+ CFV      Full           Yes      Yes                                 +---------+---------------+---------+-----------+----------+--------------+ SFJ      Full                                                        +---------+---------------+---------+-----------+----------+--------------+ FV Prox  Full                                                        +---------+---------------+---------+-----------+----------+--------------+ FV Mid   Full                                                        +---------+---------------+---------+-----------+----------+--------------+ FV DistalFull                                                        +---------+---------------+---------+-----------+----------+--------------+  PFV      Full                                                         +---------+---------------+---------+-----------+----------+--------------+ POP      Full           No       Yes                                 +---------+---------------+---------+-----------+----------+--------------+ PTV      Full                                                        +---------+---------------+---------+-----------+----------+--------------+ PERO     Full                                                        +---------+---------------+---------+-----------+----------+--------------+   +---------+---------------+---------+-----------+----------+--------------+ LEFT     CompressibilityPhasicitySpontaneityPropertiesThrombus Aging +---------+---------------+---------+-----------+----------+--------------+ CFV      Full           Yes      Yes                                 +---------+---------------+---------+-----------+----------+--------------+ SFJ      Full                                                        +---------+---------------+---------+-----------+----------+--------------+ FV Prox  Full                                                        +---------+---------------+---------+-----------+----------+--------------+ FV Mid   Full                                                        +---------+---------------+---------+-----------+----------+--------------+ FV DistalFull                                                        +---------+---------------+---------+-----------+----------+--------------+ PFV      Full                                                        +---------+---------------+---------+-----------+----------+--------------+  POP      Full           Yes      Yes                                 +---------+---------------+---------+-----------+----------+--------------+ PTV      Full                                                         +---------+---------------+---------+-----------+----------+--------------+ PERO     Full                                                        +---------+---------------+---------+-----------+----------+--------------+     Summary: BILATERAL: - No evidence of deep vein thrombosis seen in the lower extremities, bilaterally. - RIGHT: - A cystic structure is found in the popliteal fossa.  LEFT: - A cystic structure is found in the popliteal fossa.  *See table(s) above for measurements and observations. Electronically signed by Monica Martinez MD on 03/05/2022 at 5:35:07 PM.    Final    VAS US CAROTID  Result Date: 03/04/2022 Carotid Arterial Duplex Study Patient Name:  Kaitlin Gomez  Date of Exam:   03/04/2022 Medical Rec #: [id number]       Accession #:    [id number] Date of Birth: [birth year]       Patient Gender: F Patient Age:   74 years Exam Location:  [facility] Procedure:      VAS US CAROTID Referring Phys: Anibal Henderson --------------------------------------------------------------------------------  Indications:       CVA and New altered mental status. Risk Factors:      Hypertension, hyperlipidemia, Diabetes. Other Factors:     Admitted for GI Bleed. Comparison Study:  No prior study on file Performing Technologist: Sharion Dove RVS  Examination Guidelines: A complete evaluation includes B-mode imaging, spectral Doppler, color Doppler, and power Doppler as needed of all accessible portions of each vessel. Bilateral testing is considered an integral part of a complete examination. Limited examinations for reoccurring indications may be performed as noted.  Right Carotid Findings: +----------+--------+--------+--------+------------------+------------------+           PSV cm/sEDV cm/sStenosisPlaque DescriptionComments           +----------+--------+--------+--------+------------------+------------------+ CCA Prox  103     17                                                    +----------+--------+--------+--------+------------------+------------------+ CCA Distal104     16                                intimal thickening +----------+--------+--------+--------+------------------+------------------+ ICA Prox  96      28              heterogenous                         +----------+--------+--------+--------+------------------+------------------+  ICA Mid   101     27      1-39%   heterogenous                         +----------+--------+--------+--------+------------------+------------------+ ICA Distal114     37              heterogenous                         +----------+--------+--------+--------+------------------+------------------+ ECA       100     2                                                    +----------+--------+--------+--------+------------------+------------------+ +----------+--------+-------+--------+-------------------+           PSV cm/sEDV cmsDescribeArm Pressure (mmHG) +----------+--------+-------+--------+-------------------+ [id number]                                         +----------+--------+-------+--------+-------------------+ +---------+--------+--+--------+--+ VertebralPSV cm/s71EDV cm/s16 +---------+--------+--+--------+--+  Left Carotid Findings: +----------+--------+--------+--------+------------------+--------+           PSV cm/sEDV cm/sStenosisPlaque DescriptionComments +----------+--------+--------+--------+------------------+--------+ CCA Prox  115     17              calcific                   +----------+--------+--------+--------+------------------+--------+ CCA Distal79      18                                         +----------+--------+--------+--------+------------------+--------+ ICA Prox  333     107     80-99%  calcific                   +----------+--------+--------+--------+------------------+--------+ ICA Mid   221     53                                          +----------+--------+--------+--------+------------------+--------+ ICA Distal192     43                                         +----------+--------+--------+--------+------------------+--------+ ECA       221     44                                         +----------+--------+--------+--------+------------------+--------+ +----------+--------+--------+--------+-------------------+           PSV cm/sEDV cm/sDescribeArm Pressure (mmHG) +----------+--------+--------+--------+-------------------+ [id number]                                          +----------+--------+--------+--------+-------------------+ +---------+--------+--+--------+--+ VertebralPSV cm/s65EDV cm/s17 +---------+--------+--+--------+--+   Summary: Right Carotid: Velocities in the right ICA are consistent with a 1-39% stenosis.  Left Carotid: Velocities in the left ICA are consistent with a 80-99% stenosis. Vertebrals:  Bilateral vertebral arteries demonstrate antegrade flow. Subclavians: Normal flow hemodynamics were seen in bilateral subclavian              arteries. *See table(s) above for measurements and observations.  Electronically signed by Antony Contras MD on 03/04/2022 at 12:36:39 PM.    Final      LOS: 9 days   Oren Binet, MD  [facility]    To contact the attending provider between 7A-7P or the covering provider during after hours 7P-7A, please log into the web site [email or web address] and access using universal [place] password for that web site. If you do not have the password, please call the hospital operator.  03/06/2022, 11:52 AM

## 2022-03-07 ENCOUNTER — Other Ambulatory Visit: Payer: Self-pay | Admitting: Cardiology

## 2022-03-07 ENCOUNTER — Encounter: Payer: Self-pay | Admitting: *Deleted

## 2022-03-07 ENCOUNTER — Inpatient Hospital Stay (HOSPITAL_COMMUNITY): Payer: Medicare HMO

## 2022-03-07 DIAGNOSIS — I4891 Unspecified atrial fibrillation: Secondary | ICD-10-CM

## 2022-03-07 DIAGNOSIS — I440 Atrioventricular block, first degree: Secondary | ICD-10-CM

## 2022-03-07 DIAGNOSIS — I639 Cerebral infarction, unspecified: Secondary | ICD-10-CM

## 2022-03-07 DIAGNOSIS — E119 Type 2 diabetes mellitus without complications: Secondary | ICD-10-CM

## 2022-03-07 DIAGNOSIS — R001 Bradycardia, unspecified: Secondary | ICD-10-CM

## 2022-03-07 LAB — CBC
HCT: 27.6 % — ABNORMAL LOW (ref 36.0–46.0)
Hemoglobin: 8.9 g/dL — ABNORMAL LOW (ref 12.0–15.0)
MCH: 30.9 pg (ref 26.0–34.0)
MCHC: 32.2 g/dL (ref 30.0–36.0)
MCV: 95.8 fL (ref 80.0–100.0)
Platelets: 442 10*3/uL — ABNORMAL HIGH (ref 150–400)
RBC: 2.88 MIL/uL — ABNORMAL LOW (ref 3.87–5.11)
RDW: 15.7 % — ABNORMAL HIGH (ref 11.5–15.5)
WBC: 16.4 10*3/uL — ABNORMAL HIGH (ref 4.0–10.5)
nRBC: 0.2 % (ref 0.0–0.2)

## 2022-03-07 LAB — BASIC METABOLIC PANEL
Anion gap: 9 (ref 5–15)
BUN: 17 mg/dL (ref 8–23)
CO2: 25 mmol/L (ref 22–32)
Calcium: 9.1 mg/dL (ref 8.9–10.3)
Chloride: 102 mmol/L (ref 98–111)
Creatinine, Ser: 1.14 mg/dL — ABNORMAL HIGH (ref 0.44–1.00)
GFR, Estimated: 47 mL/min — ABNORMAL LOW (ref >60)
Glucose, Bld: 166 mg/dL — ABNORMAL HIGH (ref 70–99)
Potassium: 3.7 mmol/L (ref 3.5–5.1)
Sodium: 136 mmol/L (ref 135–145)

## 2022-03-07 LAB — GLUCOSE, CAPILLARY
Glucose-Capillary: 139 mg/dL — ABNORMAL HIGH (ref 70–99)
Glucose-Capillary: 214 mg/dL — ABNORMAL HIGH (ref 70–99)

## 2022-03-07 MED ORDER — METOPROLOL TARTRATE 50 MG PO TABS: 50.0000 mg | ORAL_TABLET | Freq: Two times a day (BID) | ORAL | 2 refills | Status: AC

## 2022-03-07 MED ORDER — IOHEXOL 350 MG/ML SOLN
100.0000 mL | Freq: Once | INTRAVENOUS | Status: AC | PRN
  Administered 2022-03-07: 60 mL via INTRAVENOUS

## 2022-03-07 NOTE — Plan of Care (Signed)
  Problem: Education: Goal: Ability to describe self-care measures that may prevent or decrease complications (Diabetes Survival Skills Education) will improve Outcome: Progressing Goal: Individualized Educational Video(s) Outcome: Progressing   Problem: Coping: Goal: Ability to adjust to condition or change in health will improve Outcome: Progressing   Problem: Fluid Volume: Goal: Ability to maintain a balanced intake and output will improve Outcome: Progressing   Problem: Health Behavior/Discharge Planning: Goal: Ability to identify and utilize available resources and services will improve Outcome: Progressing Goal: Ability to manage health-related needs will improve Outcome: Progressing   Problem: Metabolic: Goal: Ability to maintain appropriate glucose levels will improve Outcome: Progressing   Problem: Nutritional: Goal: Maintenance of adequate nutrition will improve Outcome: Progressing Goal: Progress toward achieving an optimal weight will improve Outcome: Progressing   Problem: Skin Integrity: Goal: Risk for impaired skin integrity will decrease Outcome: Progressing   Problem: Tissue Perfusion: Goal: Adequacy of tissue perfusion will improve Outcome: Progressing   Problem: Education: Goal: Understanding of CV disease, CV risk reduction, and recovery process will improve Outcome: Progressing Goal: Individualized Educational Video(s) Outcome: Progressing   Problem: Activity: Goal: Ability to return to baseline activity level will improve Outcome: Progressing   Problem: Cardiovascular: Goal: Ability to achieve and maintain adequate cardiovascular perfusion will improve Outcome: Progressing Goal: Vascular access site(s) Level 0-1 will be maintained Outcome: Progressing   Problem: Health Behavior/Discharge Planning: Goal: Ability to safely manage health-related needs after discharge will improve Outcome: Progressing   Problem: Education: Goal: Knowledge  of General Education information will improve Description: Including pain rating scale, medication(s)/side effects and non-pharmacologic comfort measures Outcome: Progressing   Problem: Health Behavior/Discharge Planning: Goal: Ability to manage health-related needs will improve Outcome: Progressing   Problem: Clinical Measurements: Goal: Ability to maintain clinical measurements within normal limits will improve Outcome: Progressing Goal: Will remain free from infection Outcome: Progressing Goal: Diagnostic test results will improve Outcome: Progressing Goal: Respiratory complications will improve Outcome: Progressing Goal: Cardiovascular complication will be avoided Outcome: Progressing   Problem: Activity: Goal: Risk for activity intolerance will decrease Outcome: Progressing   Problem: Nutrition: Goal: Adequate nutrition will be maintained Outcome: Progressing   Problem: Coping: Goal: Level of anxiety will decrease Outcome: Progressing   Problem: Elimination: Goal: Will not experience complications related to bowel motility Outcome: Progressing Goal: Will not experience complications related to urinary retention Outcome: Progressing   Problem: Pain Managment: Goal: General experience of comfort will improve Outcome: Progressing   Problem: Safety: Goal: Ability to remain free from injury will improve Outcome: Progressing   Problem: Skin Integrity: Goal: Risk for impaired skin integrity will decrease Outcome: Progressing   

## 2022-03-07 NOTE — Discharge Summary (Addendum)
PATIENT DETAILS Name: Kaitlin Gomez Age: 87 y.o. Sex: female Date of Birth: 01-Feb-1935 MRN: [id number]. Admitting Physician: Orma Flaming, MD [id number], Connye Burkitt, MD  Admit Date: 02/25/2022 Discharge date: 03/07/2022  Recommendations for Outpatient Follow-up:  Follow up with PCP in 1-2 weeks Please obtain CMP/CBC in one week Please ensure follow-up with neurology, vascular surgery  Admitted From:  Home  Disposition: Home health   Discharge Condition: good  CODE STATUS:   Code Status: Full Code   Diet recommendation:  Diet Order             Diet - low sodium heart healthy           Diet Carb Modified           DIET DYS 3 Room service appropriate? Yes; Fluid consistency: Thin  Diet effective now                    Brief Summary: Patient is a 87 y.o.  female history of Crohn's disease-s/p sigmoid colectomy, colovesical fistula repair 1995, DM-2, HTN, HLD who presented with lower GI bleeding (likely diverticular) with acute blood loss anemia.  She was evaluated by IR underwent coil embolization.  Further hospital course complicated by acute CVA.  See below for further details.   Significant events: 10/1>> admit to [facility]-likely diverticular bleeding.  Embolization by IR. 10/5>> delirium/confusion 10/8>> MRI brain positive for acute CVA.   Significant studies: 10/1>> A1c: 6.3 10/1>> CT angio abdomen/pelvis: Bleeding in the ascending colon. 10/2>> Echo: EF 60-65% 10/6>> CT head: No acute intracranial abnormality 10/7>> CXR: No PNA 10/7>> MRI brain: Scattered subcentimeter ischemic infarcts in bilateral cerebral hemispheres. 10/7>> MRA head: Advanced intracranial atherosclerosis. 10/8>> carotid duplex: Left ICA-80-99% stenosis, right ICA 1-29% stenosis. 10/8>> LDL: 65 10/9>> lower extremity Doppler: No DVT.   Significant microbiology data:     Procedures: 10/1>> mesenteric angiogram/call embolization by IR.   Consults: IR Neurology Vascular  surgery  Brief Hospital Course: Lower GI bleeding with acute blood loss anemia Etiology diverticular bleeding-s/p coil embolization by IR on 10/1 No bleeding for more than 5 days now-had brown-colored stools the day before yesterday. Hemoglobin stable-Did not require PRBC transfusion   AKI Hemodynamically mediated Resolved   Acute CVA No focal deficits-mostly incidental finding as MRI was done to evaluate delirium/confusion. ?  Embolic etiology-needs 40-CXK event monitor arranged on discharge (epic message sent to cardiology) Extensive work-up as above. Dr. Waldron Labs discussed with IR-and has been started on ASA Continue aspirin/statin on discharge Ensure follow-up with neurology/stroke clinic   Left ICA stenosis (80-99%) Appears asymptomatic-as CVA is bilateral. On ASA/statin Appreciate VVS consultation-spoke with Dr. Reatha Harps will schedule follow-up in his office.  Okay for discharge.   Delirium Seems to have resolved-answering questions appropriately.   Urinary retention Resolved-Foley catheter removed 10/8   Sinus bradycardia TSH stable Tachycardia with activity but stable over the past few days. Tolerating resumption of beta-blocker without any incidence.   HTN BP stable-continue metoprolol Labs some permissive hypertension due to carotid artery stenosis. Amlodipine, clonidine, Lasix quinapril remain on hold-PCP to resume when able.   HLD On statin   DM-2 CBG stable with SSI while  Resume metformin on discharge   BMI: Estimated body mass index is 29.6 kg/m as calculated from the following:   Height as of an earlier encounter on 02/25/22: '5\' 7"'$  (1.702 m).   Weight as of an earlier encounter on 02/25/22: 85.7 kg.   Discharge Diagnoses:  Principal Problem:  Lower GI bleed Active Problems:   Sinus bradycardia   Hypokalemia   Crohn's disease (HCC)   Diabetes ([place])   HTN (hypertension)   Hyperlipidemia   Discharge Instructions:  Activity:  As  tolerated with Full fall precautions use walker/cane & assistance as needed   Discharge Instructions     Ambulatory referral to Neurology   Complete by: As directed    An appointment is requested in approximately: 8 weeks   Diet - low sodium heart healthy   Complete by: As directed    Diet Carb Modified   Complete by: As directed    Discharge instructions   Complete by: As directed    Follow with Primary MD  Marguerita Merles, MD in 1-2 weeks  Please ensure follow-up with neurology, vascular surgery  Please get a complete blood count and chemistry panel checked by your Primary MD at your next visit, and again as instructed by your Primary MD.  Get Medicines reviewed and adjusted: Please take all your medications with you for your next visit with your Primary MD  Laboratory/radiological data: Please request your Primary MD to go over all hospital tests and procedure/radiological results at the follow up, please ask your Primary MD to get all Hospital records sent to his/her office.  In some cases, they will be blood work, cultures and biopsy results pending at the time of your discharge. Please request that your primary care M.D. follows up on these results.  Also Note the following: If you experience worsening of your admission symptoms, develop shortness of breath, life threatening emergency, suicidal or homicidal thoughts you must seek medical attention immediately by calling 911 or calling your MD immediately  if symptoms less severe.  You must read complete instructions/literature along with all the possible adverse reactions/side effects for all the Medicines you take and that have been prescribed to you. Take any new Medicines after you have completely understood and accpet all the possible adverse reactions/side effects.   Do not drive when taking Pain medications or sleeping medications (Benzodaizepines)  Do not take more than prescribed Pain, Sleep and Anxiety Medications. It  is not advisable to combine anxiety,sleep and pain medications without talking with your primary care practitioner  Special Instructions: If you have smoked or chewed Tobacco  in the last 2 yrs please stop smoking, stop any regular Alcohol  and or any Recreational drug use.  Wear Seat belts while driving.  Please note: You were cared for by a hospitalist during your hospital stay. Once you are discharged, your primary care physician will handle any further medical issues. Please note that NO REFILLS for any discharge medications will be authorized once you are discharged, as it is imperative that you return to your primary care physician (or establish a relationship with a primary care physician if you do not have one) for your post hospital discharge needs so that they can reassess your need for medications and monitor your lab values.   Increase activity slowly   Complete by: As directed    No dressing needed   Complete by: As directed       Allergies as of 03/07/2022       Reactions   Advil [ibuprofen] Rash        Medication List     STOP taking these medications    amLODipine 10 MG tablet Commonly known as: NORVASC   atenolol 50 MG tablet Commonly known as: TENORMIN   cloNIDine 0.3 MG tablet Commonly known  as: CATAPRES   furosemide 40 MG tablet Commonly known as: LASIX   quinapril 40 MG tablet Commonly known as: ACCUPRIL       TAKE these medications    aspirin EC 81 MG tablet Take 81 mg by mouth daily. Swallow whole.   lovastatin 40 MG tablet Commonly known as: MEVACOR Take 40 mg by mouth at bedtime.   metFORMIN 500 MG 24 hr tablet Commonly known as: GLUCOPHAGE-XR Take 500 mg by mouth daily.   metoprolol tartrate 50 MG tablet Commonly known as: LOPRESSOR Take 1 tablet (50 mg total) by mouth 2 (two) times daily.   Oscal 500/200 D-3 500-200 MG-UNIT tablet Generic drug: calcium-vitamin D Take 1 tablet by mouth daily.               Discharge  Care Instructions  (From admission, onward)           Start     Ordered   03/07/22 0000  No dressing needed        03/07/22 1151            Allergies  Allergen Reactions   Advil [Ibuprofen] Rash     Other Procedures/Studies: CT ANGIO NECK W OR WO CONTRAST  Result Date: 03/07/2022 CLINICAL DATA:  Carotid artery stenosis. EXAM: CT ANGIOGRAPHY NECK TECHNIQUE: Multidetector CT imaging of the neck was performed using the standard protocol during bolus administration of intravenous contrast. Multiplanar CT image reconstructions and MIPs were obtained to evaluate the vascular anatomy. Carotid stenosis measurements (when applicable) are obtained utilizing NASCET criteria, using the distal internal carotid diameter as the denominator. RADIATION DOSE REDUCTION: This exam was performed according to the departmental dose-optimization program which includes automated exposure control, adjustment of the mA and/or kV according to patient size and/or use of iterative reconstruction technique. CONTRAST:  37m OMNIPAQUE IOHEXOL 350 MG/ML SOLN COMPARISON:  Cervical spine CT 01/23/2015 FINDINGS: Aortic arch: There is calcified atherosclerotic plaque in the imaged aortic arch. The origins of the major branch vessels are patent. The subclavian arteries are patent to the level imaged. Right carotid system: The right common carotid artery is patent. There is mild calcified plaque at the bifurcation without hemodynamically significant stenosis or occlusion. The external carotid artery is patent. There is no dissection or aneurysm. Left carotid system: The left common carotid artery is patent. There is bulky mixed but predominantly noncalcified plaque in the proximal internal carotid artery resulting in high-grade (proximally 80%) stenosis. The distal internal carotid artery is patent. The external carotid artery is patent. There is no evidence of dissection or aneurysm. Vertebral arteries: The vertebral arteries  are patent with scattered mild calcified plaque but no hemodynamically significant stenosis or occlusion. There is no evidence of dissection or aneurysm. Skeleton: There is advanced multilevel degenerative change of the cervical spine with a least moderate to severe spinal canal stenosis C4-C5. There is no acute osseous abnormality or suspicious osseous lesion. There is no visible canal hematoma. Other neck: There is complete opacification of the right sphenoid sinus with surrounding hyperostosis consistent with chronic sinusitis. The soft tissues of the neck are unremarkable. Upper chest: The imaged lung apices are clear. IMPRESSION: 1. High-grade (approximately 80%) stenosis of the proximal left internal carotid artery due to mixed but primarily noncalcified plaque. 2. Otherwise mild calcified plaque in the vasculature of the neck without other hemodynamically significant stenosis or occlusion. 3. Advanced multilevel degenerative change of the cervical spine with a least moderate to severe spinal canal stenosis at C4-C5, similar  to the cervical spine CT from 01/23/2015 Electronically Signed   By: Valetta Mole M.D.   On: 03/07/2022 09:07   VAS [place] LOWER EXTREMITY VENOUS (DVT)  Result Date: 03/05/2022  Lower Venous DVT Study Patient Name:  DESHANTA LADY  Date of Exam:   03/05/2022 Medical Rec #: [id number]       Accession #:    [id number] Date of Birth: November 13, 1934       Patient Gender: F Patient Age:   57 years Exam Location:  [facility] Procedure:      VAS [place] LOWER EXTREMITY VENOUS (DVT) Referring Phys: Oren Binet --------------------------------------------------------------------------------  Indications: Stroke.  Limitations: Body habitus. Comparison Study: No prior study Performing Technologist: Jody Hill RVT, RDMS  Examination Guidelines: A complete evaluation includes B-mode imaging, spectral Doppler, color Doppler, and power Doppler as needed of all accessible portions of each vessel.  Bilateral testing is considered an integral part of a complete examination. Limited examinations for reoccurring indications may be performed as noted. The reflux portion of the exam is performed with the patient in reverse Trendelenburg.  +---------+---------------+---------+-----------+----------+--------------+ RIGHT    CompressibilityPhasicitySpontaneityPropertiesThrombus Aging +---------+---------------+---------+-----------+----------+--------------+ CFV      Full           Yes      Yes                                 +---------+---------------+---------+-----------+----------+--------------+ SFJ      Full                                                        +---------+---------------+---------+-----------+----------+--------------+ FV Prox  Full                                                        +---------+---------------+---------+-----------+----------+--------------+ FV Mid   Full                                                        +---------+---------------+---------+-----------+----------+--------------+ FV DistalFull                                                        +---------+---------------+---------+-----------+----------+--------------+ PFV      Full                                                        +---------+---------------+---------+-----------+----------+--------------+ POP      Full           No       Yes                                 +---------+---------------+---------+-----------+----------+--------------+  PTV      Full                                                        +---------+---------------+---------+-----------+----------+--------------+ PERO     Full                                                        +---------+---------------+---------+-----------+----------+--------------+   +---------+---------------+---------+-----------+----------+--------------+ LEFT      CompressibilityPhasicitySpontaneityPropertiesThrombus Aging +---------+---------------+---------+-----------+----------+--------------+ CFV      Full           Yes      Yes                                 +---------+---------------+---------+-----------+----------+--------------+ SFJ      Full                                                        +---------+---------------+---------+-----------+----------+--------------+ FV Prox  Full                                                        +---------+---------------+---------+-----------+----------+--------------+ FV Mid   Full                                                        +---------+---------------+---------+-----------+----------+--------------+ FV DistalFull                                                        +---------+---------------+---------+-----------+----------+--------------+ PFV      Full                                                        +---------+---------------+---------+-----------+----------+--------------+ POP      Full           Yes      Yes                                 +---------+---------------+---------+-----------+----------+--------------+ PTV      Full                                                        +---------+---------------+---------+-----------+----------+--------------+  PERO     Full                                                        +---------+---------------+---------+-----------+----------+--------------+     Summary: BILATERAL: - No evidence of deep vein thrombosis seen in the lower extremities, bilaterally. - RIGHT: - A cystic structure is found in the popliteal fossa.  LEFT: - A cystic structure is found in the popliteal fossa.  *See table(s) above for measurements and observations. Electronically signed by Monica Martinez MD on 03/05/2022 at 5:35:07 PM.    Final    MR ANGIO HEAD WO CONTRAST  Result Date: 03/04/2022 CLINICAL  DATA:  Stroke, determine source. EXAM: MRA HEAD WITHOUT CONTRAST TECHNIQUE: Angiographic images of the Circle of Willis were acquired using MRA technique without intravenous contrast. COMPARISON:  Brain MRI from yesterday FINDINGS: Anterior circulation: Extensive atheromatous irregularity of the carotid siphons. Flow reducing stenosis could be present at the posterior genu, appearance likely accentuated by turbulent flow. Advanced intracranial branch atherosclerosis with high-grade left M2 and right A2 segment stenoses. Negative for aneurysm or major branch occlusion. Posterior circulation: The vertebral and basilar arteries are diffusely patent. Extensive heterogeneity of the posterior cerebral and left PICA branches. No major branch occlusion or aneurysm IMPRESSION: Advanced intracranial atherosclerosis with dominant narrowings listed above. No emergent finding. Electronically Signed   By: Jorje Guild M.D.   On: 03/04/2022 13:03   VAS US CAROTID  Result Date: 03/04/2022 Carotid Arterial Duplex Study Patient Name:  MASYN FULLAM  Date of Exam:   03/04/2022 Medical Rec #: [id number]       Accession #:    [id number] Date of Birth: May 18, 1935       Patient Gender: F Patient Age:   52 years Exam Location:  [facility] Procedure:      VAS US CAROTID Referring Phys: Anibal Henderson --------------------------------------------------------------------------------  Indications:       CVA and New altered mental status. Risk Factors:      Hypertension, hyperlipidemia, Diabetes. Other Factors:     Admitted for GI Bleed. Comparison Study:  No prior study on file Performing Technologist: Sharion Dove RVS  Examination Guidelines: A complete evaluation includes B-mode imaging, spectral Doppler, color Doppler, and power Doppler as needed of all accessible portions of each vessel. Bilateral testing is considered an integral part of a complete examination. Limited examinations for reoccurring indications may be  performed as noted.  Right Carotid Findings: +----------+--------+--------+--------+------------------+------------------+           PSV cm/sEDV cm/sStenosisPlaque DescriptionComments           +----------+--------+--------+--------+------------------+------------------+ CCA Prox  103     17                                                   +----------+--------+--------+--------+------------------+------------------+ CCA Distal104     16                                intimal thickening +----------+--------+--------+--------+------------------+------------------+ ICA Prox  96      28  heterogenous                         +----------+--------+--------+--------+------------------+------------------+ ICA Mid   101     27      1-39%   heterogenous                         +----------+--------+--------+--------+------------------+------------------+ ICA Distal114     37              heterogenous                         +----------+--------+--------+--------+------------------+------------------+ ECA       100     2                                                    +----------+--------+--------+--------+------------------+------------------+ +----------+--------+-------+--------+-------------------+           PSV cm/sEDV cmsDescribeArm Pressure (mmHG) +----------+--------+-------+--------+-------------------+ [id number]                                         +----------+--------+-------+--------+-------------------+ +---------+--------+--+--------+--+ VertebralPSV cm/s71EDV cm/s16 +---------+--------+--+--------+--+  Left Carotid Findings: +----------+--------+--------+--------+------------------+--------+           PSV cm/sEDV cm/sStenosisPlaque DescriptionComments +----------+--------+--------+--------+------------------+--------+ CCA Prox  115     17              calcific                    +----------+--------+--------+--------+------------------+--------+ CCA Distal79      18                                         +----------+--------+--------+--------+------------------+--------+ ICA Prox  333     107     80-99%  calcific                   +----------+--------+--------+--------+------------------+--------+ ICA Mid   221     53                                         +----------+--------+--------+--------+------------------+--------+ ICA Distal192     43                                         +----------+--------+--------+--------+------------------+--------+ ECA       221     44                                         +----------+--------+--------+--------+------------------+--------+ +----------+--------+--------+--------+-------------------+           PSV cm/sEDV cm/sDescribeArm Pressure (mmHG) +----------+--------+--------+--------+-------------------+ [id number]                                          +----------+--------+--------+--------+-------------------+ +---------+--------+--+--------+--+  VertebralPSV cm/s65EDV cm/s17 +---------+--------+--+--------+--+   Summary: Right Carotid: Velocities in the right ICA are consistent with a 1-39% stenosis. Left Carotid: Velocities in the left ICA are consistent with a 80-99% stenosis. Vertebrals:  Bilateral vertebral arteries demonstrate antegrade flow. Subclavians: Normal flow hemodynamics were seen in bilateral subclavian              arteries. *See table(s) above for measurements and observations.  Electronically signed by Antony Contras MD on 03/04/2022 at 12:36:39 PM.    Final    MR BRAIN WO CONTRAST  Result Date: 03/03/2022 CLINICAL DATA:  Initial evaluation for altered mental status. EXAM: MRI HEAD WITHOUT CONTRAST TECHNIQUE: Multiplanar, multiecho pulse sequences of the brain and surrounding structures were obtained without intravenous contrast. COMPARISON:  Prior head CT from  03/02/2022. FINDINGS: Brain: Cerebral volume within normal limits. Scattered patchy T2/FLAIR hyperintensity involving the periventricular deep white matter both cerebral hemispheres, most consistent with chronic microvascular ischemic disease, mild for age. Few scattered small chronic cerebellar infarcts noted. Few scattered foci of restricted diffusion seen involving the bilateral cerebral hemispheres (series 3, images 34, 28, 23), consistent with acute ischemic infarcts. Largest of these foci seen at the left temporal region and measures 8 mm. No associated hemorrhage or mass effect. Gray-white matter differentiation otherwise maintained. No acute or chronic intracranial hemorrhage. No mass lesion, midline shift or mass effect. No hydrocephalus or extra-axial fluid collection. Cavum et septum pellucidum noted. Pituitary gland and suprasellar region normal. Incidental note made of a tiny subcentimeter benign appearing pineal cyst. Vascular: Major intracranial vascular flow voids are maintained. Skull and upper cervical spine: Craniocervical junction with normal limits. Bone marrow signal intensity normal. Hyperostosis frontalis interna noted. No scalp soft tissue abnormality. Sinuses/Orbits: Prior ocular lens replacement on the right. Chronic right sphenoid sinusitis noted. Paranasal sinuses are otherwise largely clear. No mastoid effusion. Other: None. IMPRESSION: 1. Few scattered subcentimeter acute ischemic infarcts involving the bilateral cerebral hemispheres as above. No associated hemorrhage or mass effect. A central thromboembolic etiology is likely given the various vascular distributions involved. 2. Underlying mild chronic microvascular ischemic disease for age, with a few scattered small chronic cerebellar infarcts. 3. Chronic right sphenoid sinusitis. Electronically Signed   By: Jeannine Boga M.D.   On: 03/03/2022 19:54   DG Chest Port 1 View  Result Date: 03/03/2022 CLINICAL DATA:  Altered  mental status EXAM: PORTABLE CHEST 1 VIEW COMPARISON:  Portable exam 1607 hours compared to 01/23/2015 FINDINGS: Enlargement of cardiac silhouette. Mediastinal contours and pulmonary vascularity normal. Minimal linear bibasilar scarring Lungs otherwise clear. No pulmonary infiltrate, pleural effusion, or pneumothorax. Bones demineralized. IMPRESSION: No acute abnormalities. Electronically Signed   By: Lavonia Dana M.D.   On: 03/03/2022 16:19   CT HEAD WO CONTRAST (5MM)  Result Date: 03/02/2022 CLINICAL DATA:  Delirium.  Altered mental status. EXAM: CT HEAD WITHOUT CONTRAST TECHNIQUE: Contiguous axial images were obtained from the base of the skull through the vertex without intravenous contrast. RADIATION DOSE REDUCTION: This exam was performed according to the departmental dose-optimization program which includes automated exposure control, adjustment of the mA and/or kV according to patient size and/or use of iterative reconstruction technique. COMPARISON:  CT head without contrast 01/21/2015 FINDINGS: Brain: No acute infarct, hemorrhage, or mass lesion is present. No significant white matter lesions are present. Acute septum pellucidum noted. Ventricular size is normal. No significant extraaxial fluid collection is present. The brainstem and cerebellum are within normal limits. Vascular: Atherosclerotic calcifications are present within the cavernous internal carotid arteries  bilaterally. No hyperdense vessel is present. Skull: Hyperostosis is again noted. Calvarium is intact. No focal lytic or blastic lesions are present. No significant extracranial soft tissue lesion is present. Sinuses/Orbits: Chronic right maxillary sinus opacification again noted. The paranasal sinuses and mastoid air cells are otherwise clear. Right lens replacement is present. Globes and orbits are otherwise within normal limits. IMPRESSION: 1. No acute intracranial abnormality or significant interval change. 2. Stable chronic right  maxillary sinus disease. Electronically Signed   By: San Morelle M.D.   On: 03/02/2022 15:12   EEG adult  Result Date: 03/02/2022 Lora Havens, MD     03/02/2022  5:38 PM Patient Name: MARYCARMEN HAGEY MRN: [id number] Epilepsy Attending: Lora Havens Referring Physician/Provider: Albertine Patricia, MD Date: 03/02/2022 Duration: 24.27 mins Patient history: 87yo F with ams . EEG to evaluate for seizure Level of alertness: Awake, asleep AEDs during EEG study: None Technical aspects: This EEG study was done with scalp electrodes positioned according to the 10-20 International system of electrode placement. Electrical activity was reviewed with band pass filter of 1-'70Hz'$ , sensitivity of 7 uV/mm, display speed of 86m/sec with a '60Hz'$  notched filter applied as appropriate. EEG data were recorded continuously and digitally stored.  Video monitoring was available and reviewed as appropriate. Description: No clear posterior dominant rhythm was seen. Sleep was characterized by vertex waves, sleep spindles (12 to 14 Hz), maximal frontocentral region. EEG showed continuous generalized 3 to 6 Hz theta-delta slowing. Hyperventilation and photic stimulation were not performed.   ABNORMALITY - Continuous slow, generalized IMPRESSION: This study is suggestive of moderate diffuse encephalopathy, nonspecific etiology. No seizures or epileptiform discharges were seen throughout the recording. Priyanka O Yadav   IR EMBO ART  VEN HEMORR LYMPH EXTRAV  INC GUIDE ROADMAPPING  Result Date: 02/27/2022 INDICATION: 87year old female with acute right-sided GI hemorrhage EXAM: ULTRASOUND-GUIDED ACCESS RIGHT COMMON FEMORAL ARTERY MESENTERIC ANGIOGRAM SUPER SELECTIVE CATHETER POSITION TO THE VASA RECTA OF THE CECUM EMBOLIZATION OF THE VASA RECTA FOR EMERGENT GI HEMORRHAGE CELT FOR HEMOSTASIS MEDICATIONS: None ANESTHESIA/SEDATION: Moderate (conscious) sedation was employed during this procedure. A total of Versed 1.0 mg and  Fentanyl 50 mcg was administered intravenously by the radiology nurse. Total intra-service moderate Sedation Time: 39 minutes. The patient's level of consciousness and vital signs were monitored continuously by radiology nursing throughout the procedure under my direct supervision. CONTRAST:  50 cc FLUOROSCOPY: Radiation Exposure Index (as provided by the fluoroscopic device): 9 minutes, 4932mGy Kerma COMPLICATIONS: None PROCEDURE: Informed consent was obtained from the patient following explanation of the procedure, risks, benefits and alternatives. The patient understands, agrees and consents for the procedure. All questions were addressed. A time out was performed prior to the initiation of the procedure. Maximal barrier sterile technique utilized including caps, mask, sterile gowns, sterile gloves, large sterile drape, hand hygiene, and Betadine prep. Ultrasound survey of the right inguinal region was performed with images stored and sent to PACs, confirming patency of the vessel. A micropuncture needle was used access the right common femoral artery under ultrasound. With excellent arterial blood flow returned, and an .018 micro wire was passed through the needle, observed enter the abdominal aorta under fluoroscopy. The needle was removed, and a micropuncture sheath was placed over the wire. The inner dilator and wire were removed, and an 035 Bentson wire was advanced under fluoroscopy into the abdominal aorta. The sheath was removed and a standard 5 [place] sheath was placed. The dilator was removed and the sheath  was flushed. C2 cobra catheter was advanced into the abdominal aorta and used to select the SMA. Angiogram was performed. The catheter was un stable at the origin of the SMA, and attempt at advancing distally with the Glidewire was unsuccessful. The catheter was exchanged for a Mickelson catheter. Tip of the Mickelson catheter were used to select the SMA origin. With a stable catheter  position, a microcatheter coaxial system was advanced within 025 penumbra lantern with 014 fathom wire. Catheter was placed into the right colic artery. Angiogram was performed. Catheter was then advanced into the descending branch of the ileocolic artery. Angiogram was performed. Microcatheter was addended advanced into the marginal artery, that were supplying the Vasa recta in the region of interest. The catheter would not advance into the specific Vasa recta of interest, and as such the origin of the Vasa recta near branch point of the marginal artery were coil embolized with a single 2 mm coil and a single 1 mm coil. Catheter was withdrawn and angiogram was performed. At the conclusion, there was no further hemorrhage identified. All catheters wires were withdrawn. A Celt was deployed for hemostasis. Patient tolerated the procedure well and remained hemodynamically stable throughout. No complications were encountered and no significant blood loss. 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 Celt deployed for hemostasis. Signed, Dulcy Fanny. Nadene Rubins, RPVI [facility] Radiology Electronically Signed   By: Corrie Mckusick D.O.   On: 02/27/2022 08:48   IR US Guide Vasc Access Right  Result Date: 02/27/2022 INDICATION: 87 year old female with acute right-sided GI hemorrhage EXAM: ULTRASOUND-GUIDED ACCESS RIGHT COMMON FEMORAL ARTERY MESENTERIC ANGIOGRAM SUPER SELECTIVE CATHETER POSITION TO THE VASA RECTA OF THE CECUM EMBOLIZATION OF THE VASA RECTA FOR EMERGENT GI HEMORRHAGE CELT FOR HEMOSTASIS MEDICATIONS: None ANESTHESIA/SEDATION: Moderate (conscious) sedation was employed during this procedure. A total of Versed  1.0 mg and Fentanyl 50 mcg was administered intravenously by the radiology nurse. Total intra-service moderate Sedation Time: 39 minutes. The patient's level of consciousness and vital signs were monitored continuously by radiology nursing throughout the procedure under my direct supervision. CONTRAST:  50 cc FLUOROSCOPY: Radiation Exposure Index (as provided by the fluoroscopic device): 9 minutes, 016 mGy Kerma COMPLICATIONS: None PROCEDURE: Informed consent was obtained from the patient following explanation of the procedure, risks, benefits and alternatives. The patient understands, agrees and consents for the procedure. All questions were addressed. A time out was performed prior to the initiation of the procedure. Maximal barrier sterile technique utilized including caps, mask, sterile gowns, sterile gloves, large sterile drape, hand hygiene, and Betadine prep. Ultrasound survey of the right inguinal region was performed with images stored and sent to PACs, confirming patency of the vessel. A micropuncture needle was used access the right common femoral artery under ultrasound. With excellent arterial blood flow returned, and an .018 micro wire was passed through the needle, observed enter the abdominal aorta under fluoroscopy. The needle was removed, and a micropuncture sheath was placed over the wire. The inner dilator and wire were removed, and an 035 Bentson wire was advanced under fluoroscopy into the abdominal aorta. The sheath was removed and a standard 5 [place] vascular sheath was placed. The dilator was removed and the sheath was flushed. C2 cobra catheter was advanced into the abdominal  aorta and used to select the SMA. Angiogram was performed. The catheter was un stable at the origin of the SMA, and attempt at advancing distally with the Glidewire was unsuccessful. The catheter was exchanged for a Mickelson catheter. Tip of the Mickelson catheter were used to select the SMA origin. With a stable  catheter position, a microcatheter coaxial system was advanced within 025 penumbra lantern with 014 fathom wire. Catheter was placed into the right colic artery. Angiogram was performed. Catheter was then advanced into the descending branch of the ileocolic artery. Angiogram was performed. Microcatheter was addended advanced into the marginal artery, that were supplying the Vasa recta in the region of interest. The catheter would not advance into the specific Vasa recta of interest, and as such the origin of the Vasa recta near branch point of the marginal artery were coil embolized with a single 2 mm coil and a single 1 mm coil. Catheter was withdrawn and angiogram was performed. At the conclusion, there was no further hemorrhage identified. All catheters wires were withdrawn. A Celt was deployed for hemostasis. Patient tolerated the procedure well and remained hemodynamically stable throughout. No complications were encountered and no significant blood loss. 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 Celt deployed for hemostasis. Signed, Dulcy Fanny. Nadene Rubins, RPVI [facility] Radiology [facility] Radiology Electronically Signed   By: Corrie Mckusick D.O.   On: 02/27/2022 08:48   IR Angiogram Visceral Selective  Result Date: 02/27/2022 INDICATION: 87 year old female with acute right-sided GI hemorrhage EXAM: ULTRASOUND-GUIDED ACCESS RIGHT COMMON FEMORAL ARTERY MESENTERIC ANGIOGRAM SUPER SELECTIVE CATHETER POSITION TO THE VASA RECTA OF THE CECUM EMBOLIZATION OF THE VASA RECTA FOR EMERGENT GI HEMORRHAGE CELT FOR HEMOSTASIS MEDICATIONS: None ANESTHESIA/SEDATION: Moderate (conscious) sedation was employed during this procedure. A  total of Versed 1.0 mg and Fentanyl 50 mcg was administered intravenously by the radiology nurse. Total intra-service moderate Sedation Time: 39 minutes. The patient's level of consciousness and vital signs were monitored continuously by radiology nursing throughout the procedure under my direct supervision. CONTRAST:  50 cc FLUOROSCOPY: Radiation Exposure Index (as provided by the fluoroscopic device): 9 minutes, 440 mGy Kerma COMPLICATIONS: None PROCEDURE: Informed consent was obtained from the patient following explanation of the procedure, risks, benefits and alternatives. The patient understands, agrees and consents for the procedure. All questions were addressed. A time out was performed prior to the initiation of the procedure. Maximal barrier sterile technique utilized including caps, mask, sterile gowns, sterile gloves, large sterile drape, hand hygiene, and Betadine prep. Ultrasound survey of the right inguinal region was performed with images stored and sent to PACs, confirming patency of the vessel. A micropuncture needle was used access the right common femoral artery under ultrasound. With excellent arterial blood flow returned, and an .018 micro wire was passed through the needle, observed enter the abdominal aorta under fluoroscopy. The needle was removed, and a micropuncture sheath was placed over the wire. The inner dilator and wire were removed, and an 035 Bentson wire was advanced under fluoroscopy into the abdominal aorta. The sheath was removed and a standard 5 [place] vascular sheath was placed. The dilator was removed and the sheath was flushed. C2 cobra catheter was advanced into the abdominal aorta and used to select the SMA. Angiogram was performed. The catheter  was un stable at the origin of the SMA, and attempt at advancing distally with the Glidewire was unsuccessful. The catheter was exchanged for a Mickelson catheter. Tip of the Mickelson catheter were used to select the SMA origin.  With a stable catheter position, a microcatheter coaxial system was advanced within 025 penumbra lantern with 014 fathom wire. Catheter was placed into the right colic artery. Angiogram was performed. Catheter was then advanced into the descending branch of the ileocolic artery. Angiogram was performed. Microcatheter was addended advanced into the marginal artery, that were supplying the Vasa recta in the region of interest. The catheter would not advance into the specific Vasa recta of interest, and as such the origin of the Vasa recta near branch point of the marginal artery were coil embolized with a single 2 mm coil and a single 1 mm coil. Catheter was withdrawn and angiogram was performed. At the conclusion, there was no further hemorrhage identified. All catheters wires were withdrawn. A Celt was deployed for hemostasis. Patient tolerated the procedure well and remained hemodynamically stable throughout. No complications were encountered and no significant blood loss. 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 Celt deployed for hemostasis. Signed, Dulcy Fanny. Nadene Rubins, RPVI [facility] Interventional Radiology Specialists [facility] Radiology Electronically Signed   By: Corrie Mckusick D.O.   On: 02/27/2022 08:48   IR Angiogram Selective Each Additional Vessel  Result Date: 02/27/2022 INDICATION: 87 year old female with acute right-sided GI hemorrhage EXAM: ULTRASOUND-GUIDED ACCESS RIGHT COMMON FEMORAL ARTERY MESENTERIC ANGIOGRAM SUPER SELECTIVE CATHETER POSITION TO THE VASA RECTA OF THE CECUM EMBOLIZATION OF THE VASA RECTA FOR EMERGENT GI HEMORRHAGE CELT FOR HEMOSTASIS MEDICATIONS: None ANESTHESIA/SEDATION: Moderate (conscious) sedation was employed  during this procedure. A total of Versed 1.0 mg and Fentanyl 50 mcg was administered intravenously by the radiology nurse. Total intra-service moderate Sedation Time: 39 minutes. The patient's level of consciousness and vital signs were monitored continuously by radiology nursing throughout the procedure under my direct supervision. CONTRAST:  50 cc FLUOROSCOPY: Radiation Exposure Index (as provided by the fluoroscopic device): 9 minutes, 875 mGy Kerma COMPLICATIONS: None PROCEDURE: Informed consent was obtained from the patient following explanation of the procedure, risks, benefits and alternatives. The patient understands, agrees and consents for the procedure. All questions were addressed. A time out was performed prior to the initiation of the procedure. Maximal barrier sterile technique utilized including caps, mask, sterile gowns, sterile gloves, large sterile drape, hand hygiene, and Betadine prep. Ultrasound survey of the right inguinal region was performed with images stored and sent to PACs, confirming patency of the vessel. A micropuncture needle was used access the right common femoral artery under ultrasound. With excellent arterial blood flow returned, and an .018 micro wire was passed through the needle, observed enter the abdominal aorta under fluoroscopy. The needle was removed, and a micropuncture sheath was placed over the wire. The inner dilator and wire were removed, and an 035 Bentson wire was advanced under fluoroscopy into the abdominal aorta. The sheath was removed and a standard 5 [place] vascular sheath was placed. The dilator was removed and the sheath was flushed. C2 cobra catheter was advanced into the abdominal aorta and used to select the SMA. Angiogram was performed. The catheter was un stable at the origin of the SMA, and  attempt at advancing distally with the Glidewire was unsuccessful. The catheter was exchanged for a Mickelson catheter. Tip of the Mickelson catheter were used to  select the SMA origin. With a stable catheter position, a microcatheter coaxial system was advanced within 025 penumbra lantern with 014 fathom wire. Catheter was placed into the right colic artery. Angiogram was performed. Catheter was then advanced into the descending branch of the ileocolic artery. Angiogram was performed. Microcatheter was addended advanced into the marginal artery, that were supplying the Vasa recta in the region of interest. The catheter would not advance into the specific Vasa recta of interest, and as such the origin of the Vasa recta near branch point of the marginal artery were coil embolized with a single 2 mm coil and a single 1 mm coil. Catheter was withdrawn and angiogram was performed. At the conclusion, there was no further hemorrhage identified. All catheters wires were withdrawn. A Celt was deployed for hemostasis. Patient tolerated the procedure well and remained hemodynamically stable throughout. No complications were encountered and no significant blood loss. 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 Celt deployed for hemostasis. Signed, Dulcy Fanny. Nadene Rubins, RPVI V[facility] and Interventional Radiology Specialists [facility] Radiology Electronically Signed   By: Corrie Mckusick D.O.   On: 02/27/2022 08:48   IR Angiogram Selective Each Additional Vessel  Result Date: 02/27/2022 INDICATION: 87 year old female with acute right-sided GI hemorrhage EXAM: ULTRASOUND-GUIDED ACCESS RIGHT COMMON FEMORAL ARTERY MESENTERIC ANGIOGRAM SUPER SELECTIVE CATHETER POSITION TO THE VASA RECTA OF THE CECUM EMBOLIZATION OF THE VASA RECTA FOR EMERGENT GI HEMORRHAGE CELT FOR HEMOSTASIS MEDICATIONS: None ANESTHESIA/SEDATION: Moderate (conscious)  sedation was employed during this procedure. A total of Versed 1.0 mg and Fentanyl 50 mcg was administered intravenously by the radiology nurse. Total intra-service moderate Sedation Time: 39 minutes. The patient's level of consciousness and vital signs were monitored continuously by radiology nursing throughout the procedure under my direct supervision. CONTRAST:  50 cc FLUOROSCOPY: Radiation Exposure Index (as provided by the fluoroscopic device): 9 minutes, 976 mGy Kerma COMPLICATIONS: None PROCEDURE: Informed consent was obtained from the patient following explanation of the procedure, risks, benefits and alternatives. The patient understands, agrees and consents for the procedure. All questions were addressed. A time out was performed prior to the initiation of the procedure. Maximal barrier sterile technique utilized including caps, mask, sterile gowns, sterile gloves, large sterile drape, hand hygiene, and Betadine prep. Ultrasound survey of the right inguinal region was performed with images stored and sent to PACs, confirming patency of the vessel. A micropuncture needle was used access the right common femoral artery under ultrasound. With excellent arterial blood flow returned, and an .018 micro wire was passed through the needle, observed enter the abdominal aorta under fluoroscopy. The needle was removed, and a micropuncture sheath was placed over the wire. The inner dilator and wire were removed, and an 035 Bentson wire was advanced under fluoroscopy into the abdominal aorta. The sheath was removed and a standard 5 [place] vascular sheath was placed. The dilator was removed and the sheath was flushed. C2 cobra catheter was advanced into the abdominal aorta and used to select the SMA. Angiogram was performed. The catheter was un stable at the origin of the SMA, and attempt at advancing distally with the Glidewire was unsuccessful. The  catheter was exchanged for a Mickelson catheter. Tip of the Mickelson  catheter were used to select the SMA origin. With a stable catheter position, a microcatheter coaxial system was advanced within 025 penumbra lantern with 014 fathom wire. Catheter was placed into the right colic artery. Angiogram was performed. Catheter was then advanced into the descending branch of the ileocolic artery. Angiogram was performed. Microcatheter was addended advanced into the marginal artery, that were supplying the Vasa recta in the region of interest. The catheter would not advance into the specific Vasa recta of interest, and as such the origin of the Vasa recta near branch point of the marginal artery were coil embolized with a single 2 mm coil and a single 1 mm coil. Catheter was withdrawn and angiogram was performed. At the conclusion, there was no further hemorrhage identified. All catheters wires were withdrawn. A Celt was deployed for hemostasis. Patient tolerated the procedure well and remained hemodynamically stable throughout. No complications were encountered and no significant blood loss. 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 Celt deployed for hemostasis. Signed, Dulcy Fanny. Nadene Rubins, RPVI V[facility] and Inter[facility] Radiology Specialists [facility] Radiology Electronically Signed   By: Corrie Mckusick D.O.   On: 02/27/2022 08:48   IR Angiogram Selective Each Additional Vessel  Result Date: 02/27/2022 INDICATION: 87 year old female with acute right-sided GI hemorrhage EXAM: ULTRASOUND-GUIDED ACCESS RIGHT COMMON FEMORAL ARTERY MESENTERIC ANGIOGRAM SUPER SELECTIVE CATHETER POSITION TO THE VASA RECTA OF THE CECUM EMBOLIZATION OF THE VASA RECTA FOR EMERGENT GI HEMORRHAGE CELT FOR HEMOSTASIS MEDICATIONS: None ANESTHESIA/SEDATION:  Moderate (conscious) sedation was employed during this procedure. A total of Versed 1.0 mg and Fentanyl 50 mcg was administered intravenously by the radiology nurse. Total intra-service moderate Sedation Time: 39 minutes. The patient's level of consciousness and vital signs were monitored continuously by radiology nursing throughout the procedure under my direct supervision. CONTRAST:  50 cc FLUOROSCOPY: Radiation Exposure Index (as provided by the fluoroscopic device): 9 minutes, 366 mGy Kerma COMPLICATIONS: None PROCEDURE: Informed consent was obtained from the patient following explanation of the procedure, risks, benefits and alternatives. The patient understands, agrees and consents for the procedure. All questions were addressed. A time out was performed prior to the initiation of the procedure. Maximal barrier sterile technique utilized including caps, mask, sterile gowns, sterile gloves, large sterile drape, hand hygiene, and Betadine prep. Ultrasound survey of the right inguinal region was performed with images stored and sent to PACs, confirming patency of the vessel. A micropuncture needle was used access the right common femoral artery under ultrasound. With excellent arterial blood flow returned, and an .018 micro wire was passed through the needle, observed enter the abdominal aorta under fluoroscopy. The needle was removed, and a micropuncture sheath was placed over the wire. The inner dilator and wire were removed, and an 035 Bentson wire was advanced under fluoroscopy into the abdominal aorta. The sheath was removed and a standard 5 [place] vascular sheath was placed. The dilator was removed and the sheath was flushed. C2 cobra catheter was advanced into the abdominal aorta and used to select the SMA. Angiogram was performed. The catheter was un stable at the origin of the SMA, and attempt at advancing distally with the Glidewire was unsuccessful. The catheter was exchanged for a Mickelson catheter.  Tip of  the [facility] catheter were used to select the SMA origin. With a stable catheter position, a microcatheter coaxial system was advanced within 025 penumbra lantern with 014 fathom wire. Catheter was placed into the right colic artery. Angiogram was performed. Catheter was then advanced into the descending branch of the ileocolic artery. Angiogram was performed. Microcatheter was addended advanced into the marginal artery, that were supplying the Vasa recta in the region of interest. The catheter would not advance into the specific Vasa recta of interest, and as such the origin of the Vasa recta near branch point of the marginal artery were coil embolized with a single 2 mm coil and a single 1 mm coil. Catheter was withdrawn and angiogram was performed. At the conclusion, there was no further hemorrhage identified. All catheters wires were withdrawn. A Celt was deployed for hemostasis. Patient tolerated the procedure well and remained hemodynamically stable throughout. No complications were encountered and no significant blood loss. 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 Celt deployed for hemostasis. Signed, Dulcy Fanny. Nadene Rubins, RPVI [facility] [place] [facility] Electronically Signed   By: Corrie Mckusick D.O.   On: 02/27/2022 08:48   ECHOCARDIOGRAM COMPLETE  Result Date: 02/26/2022    ECHOCARDIOGRAM REPORT   Patient Name:   SWEDEN LESURE Date of Exam: 02/26/2022 Medical Rec #:  [id number]      Height:       67.0 in Accession #:    [id number]     Weight:       189.0 lb Date of Birth:  04-Nov-1934      BSA:          1.974 m Patient Age:    47 years       BP:           134/57 mmHg Patient Gender: F               HR:           72 bpm. Exam Location:  Inpatient Procedure: 2D Echo Indications:    bradycardia  History:        Patient has no prior history of Echocardiogram examinations.                 Arrythmias:Bradycardia; Risk Factors:Hypertension, Diabetes and                 Dyslipidemia.  Sonographer:    Harvie Junior Referring Phys: [id number] [facility]  Sonographer Comments: Technically difficult study due to poor echo windows and patient is obese. Image acquisition challenging due to patient body habitus and supine. IMPRESSIONS  1. Left ventricular ejection fraction, by estimation, is 60 to 65%. The left ventricle has normal function. The left ventricle has no regional wall motion abnormalities. There is mild left ventricular hypertrophy. Left ventricular diastolic parameters are consistent with Grade I diastolic dysfunction (impaired relaxation).  2. Right ventricular systolic function is normal. The right ventricular size is normal. There is normal pulmonary artery systolic pressure. The estimated right ventricular systolic pressure is 99.3 mmHg.  3. The mitral valve is degenerative. No evidence of mitral valve regurgitation. Moderate mitral annular calcification.  4. The aortic valve was not well visualized. Aortic valve regurgitation is not visualized. No aortic stenosis is present. FINDINGS  Left Ventricle:  Left ventricular ejection fraction, by estimation, is 60 to 65%. The left ventricle has normal function. The left ventricle has no regional wall motion abnormalities. The left ventricular internal cavity size was normal in size. There is  mild left ventricular hypertrophy. Left ventricular diastolic parameters are consistent with Grade I diastolic dysfunction (impaired relaxation). Right Ventricle: The right ventricular size is normal. No increase in right ventricular wall thickness. Right ventricular systolic function is normal. There is normal pulmonary artery systolic pressure. The tricuspid regurgitant  velocity is 2.41 m/s, and  with an assumed right atrial pressure of 3 mmHg, the estimated right ventricular systolic pressure is 40.0 mmHg. Left Atrium: Left atrial size was normal in size. Right Atrium: Right atrial size was normal in size. Pericardium: There is no evidence of pericardial effusion. Mitral Valve: The mitral valve is degenerative in appearance. Moderate mitral annular calcification. No evidence of mitral valve regurgitation. Tricuspid Valve: The tricuspid valve is normal in structure. Tricuspid valve regurgitation is trivial. Aortic Valve: The aortic valve was not well visualized. Aortic valve regurgitation is not visualized. No aortic stenosis is present. Aortic valve mean gradient measures 4.0 mmHg. Aortic valve peak gradient measures 7.6 mmHg. Aortic valve area, by VTI measures 2.10 cm. Pulmonic Valve: The pulmonic valve was not well visualized. Pulmonic valve regurgitation is not visualized. Aorta: The aortic root is normal in size and structure. IAS/Shunts: The interatrial septum was not well visualized.  LEFT VENTRICLE PLAX 2D LVIDd:         4.60 cm     Diastology LVIDs:         3.40 cm     LV e' medial:    5.77 cm/s LV PW:         1.20 cm     LV E/e' medial:  15.6 LV IVS:        1.10 cm     LV e' lateral:   8.81 cm/s LVOT diam:     1.70 cm     LV E/e' lateral: 10.2 LV SV:         56 LV SV Index:   28 LVOT Area:     2.27 cm  LV Volumes (MOD) LV vol d, MOD A2C: 66.7 ml LV vol d, MOD A4C: 83.5 ml LV vol s, MOD A2C: 25.3 ml LV vol s, MOD A4C: 26.7 ml LV SV MOD A2C:     41.4 ml LV SV MOD A4C:     83.5 ml LV SV MOD BP:      49.7 ml RIGHT VENTRICLE RV Basal diam:  3.50 cm RV Mid diam:    3.20 cm RV S prime:     22.80 cm/s TAPSE (M-mode): 3.0 cm LEFT ATRIUM             Index        RIGHT ATRIUM           Index LA diam:        3.80 cm 1.92 cm/m   RA Area:     12.30 cm LA Vol (A2C):   40.1 ml 20.31 ml/m  RA Volume:   26.30 ml  13.32 ml/m LA Vol (A4C):   37.1 ml 18.79 ml/m LA Biplane Vol: 40.6 ml  20.57 ml/m  AORTIC VALVE                     PULMONIC VALVE AV Area (Vmax):    2.01 cm      PV Vmax:  1.76 m/s AV Area (Vmean):   1.67 cm      PV Peak grad:  12.4 mmHg AV Area (VTI):     2.10 cm AV Vmax:           138.00 cm/s AV Vmean:          102.000 cm/s AV VTI:            0.265 m AV Peak Grad:      7.6 mmHg AV Mean Grad:      4.0 mmHg LVOT Vmax:         122.00 cm/s LVOT Vmean:        75.000 cm/s LVOT VTI:          0.245 m LVOT/AV VTI ratio: 0.92  AORTA Ao Root diam: 3.10 cm MITRAL VALVE                TRICUSPID VALVE MV Area (PHT): 2.99 cm     TR Peak grad:   23.2 mmHg MV Decel Time: 254 msec     TR Vmax:        241.00 cm/s MV E velocity: 90.00 cm/s MV A velocity: 112.00 cm/s  SHUNTS MV E/A ratio:  0.80         Systemic VTI:  0.24 m                             Systemic Diam: 1.70 cm Oswaldo Milian MD Electronically signed by Oswaldo Milian MD Signature Date/Time: 02/26/2022/11:04:31 AM    Final    CT Angio Abd/Pel W and/or Wo Contrast  Result Date: 02/25/2022 CLINICAL DATA:  Bright red blood per rectum. History of gastrointestinal hemorrhage. EXAM: CTA ABDOMEN AND PELVIS WITHOUT AND WITH CONTRAST TECHNIQUE: Multidetector CT imaging of the abdomen and pelvis was performed using the standard protocol during bolus administration of intravenous contrast. Multiplanar reconstructed images and MIPs were obtained and reviewed to evaluate the vascular anatomy. RADIATION DOSE REDUCTION: This exam was performed according to the departmental dose-optimization program which includes automated exposure control, adjustment of the mA and/or kV according to patient size and/or use of iterative reconstruction technique. CONTRAST:  126m OMNIPAQUE IOHEXOL 350 MG/ML SOLN COMPARISON:  CT 01/23/2015 FINDINGS: VASCULAR Aorta: Normal caliber aorta without aneurysm, dissection, vasculitis or significant stenosis. Mild intimal calcification aorta is branches Celiac: Patent without evidence of aneurysm,  dissection, vasculitis or significant stenosis. SMA: Patent without evidence of aneurysm, dissection, vasculitis or significant stenosis. Renals: Both renal arteries are patent without evidence of aneurysm, dissection, vasculitis, fibromuscular dysplasia or significant stenosis. Small accessory renal arteries on LEFT and RIGHT. IMA: Patent without evidence of aneurysm, dissection, vasculitis or significant stenosis. Inflow: Patent without evidence of aneurysm, dissection, vasculitis or significant stenosis. Proximal Outflow: Bilateral common femoral and visualized portions of the superficial and profunda femoral arteries are patent without evidence of aneurysm, dissection, vasculitis or significant stenosis. Veins: No obvious venous abnormality within the limitations of this arterial phase study. Review of the MIP images confirms the above findings. NON-VASCULAR Lower chest: Lung bases are clear. Hepatobiliary: No focal hepatic lesion. No biliary duct dilatation. Common bile duct is normal. Pancreas: Pancreas is normal. No ductal dilatation. No pancreatic inflammation. Spleen: Normal spleen Adrenals/urinary tract: Adrenal glands and kidneys are normal. The ureters and bladder normal. Stomach/Bowel: No evidence of active gastrointestinal bleeding within the stomach, duodenum or small bowel. The terminal ileum is normal. Within the midportion of the ascending colon there is  a focus of high-density contrast within the dependent lumen of the ascending colon (image 108/series 9) which is not present on the noncontrast CT series. On the more delayed venous phase imaging, this intraluminal contrast increases and pools dependently and spreads antegrade. No evidence of additional active hemorrhage within the LEFT colon or rectosigmoid colon. Multiple diverticula through the colon. Vascular/Lymphatic: No periportal or retroperitoneal adenopathy. No pelvic adenopathy. Reproductive: Post hysterectomy.  Adnexa unremarkable  Other: No free fluid. Musculoskeletal: No aggressive osseous lesion. IMPRESSION: VASCULAR 1. Active gastrointestinal bleeding in the ascending colon. 2.  Aortic Atherosclerosis (ICD10-I70.0). NON-VASCULAR 1. Mild pan diverticulosis.  No evidence acute diverticulitis. Findings conveyed toSILAS WONG on 02/25/2022  at09:28. Electronically Signed   By: Suzy Bouchard M.D.   On: 02/25/2022 09:29     TODAY-DAY OF DISCHARGE:  Subjective:   Kaitlin Gomez today has no headache,no chest abdominal pain,no new weakness tingling or numbness, feels much better wants to go home today.  Objective:   Blood pressure 97/60, pulse 86, temperature 98 F (36.7 C), temperature source Oral, resp. rate 15, SpO2 97 %.  Intake/Output Summary (Last 24 hours) at 03/07/2022 1152 Last data filed at 03/07/2022 1000 Gross per 24 hour  Intake 360 ml  Output 450 ml  Net -90 ml   There were no vitals filed for this visit.  Exam: Awake Alert, Oriented *3, No new F.N deficits, Normal affect [place].AT,PERRAL Supple Neck,No JVD, No cervical lymphadenopathy appriciated.  Symmetrical Chest wall movement, Good air movement bilaterally, CTAB RRR,No Gallops,Rubs or new Murmurs, No Parasternal Heave +ve B.Sounds, Abd Soft, Non tender, No organomegaly appriciated, No rebound -guarding or rigidity. No Cyanosis, Clubbing or edema, No new Rash or bruise   PERTINENT RADIOLOGIC STUDIES: CT ANGIO NECK W OR WO CONTRAST  Result Date: 03/07/2022 CLINICAL DATA:  Carotid artery stenosis. EXAM: CT ANGIOGRAPHY NECK TECHNIQUE: Multidetector CT imaging of the neck was performed using the standard protocol during bolus administration of intravenous contrast. Multiplanar CT image reconstructions and MIPs were obtained to evaluate the vascular anatomy. Carotid stenosis measurements (when applicable) are obtained utilizing NASCET criteria, using the distal internal carotid diameter as the denominator. RADIATION DOSE REDUCTION: This exam was  performed according to the departmental dose-optimization program which includes automated exposure control, adjustment of the mA and/or kV according to patient size and/or use of iterative reconstruction technique. CONTRAST:  97m OMNIPAQUE IOHEXOL 350 MG/ML SOLN COMPARISON:  Cervical spine CT 01/23/2015 FINDINGS: Aortic arch: There is calcified atherosclerotic plaque in the imaged aortic arch. The origins of the major branch vessels are patent. The subclavian arteries are patent to the level imaged. Right carotid system: The right common carotid artery is patent. There is mild calcified plaque at the bifurcation without hemodynamically significant stenosis or occlusion. The external carotid artery is patent. There is no dissection or aneurysm. Left carotid system: The left common carotid artery is patent. There is bulky mixed but predominantly noncalcified plaque in the proximal internal carotid artery resulting in high-grade (proximally 80%) stenosis. The distal internal carotid artery is patent. The external carotid artery is patent. There is no evidence of dissection or aneurysm. Vertebral arteries: The vertebral arteries are patent with scattered mild calcified plaque but no hemodynamically significant stenosis or occlusion. There is no evidence of dissection or aneurysm. Skeleton: There is advanced multilevel degenerative change of the cervical spine with a least moderate to severe spinal canal stenosis C4-C5. There is no acute osseous abnormality or suspicious osseous lesion. There is no visible canal hematoma. Other  neck: There is complete opacification of the right sphenoid sinus with surrounding hyperostosis consistent with chronic sinusitis. The soft tissues of the neck are unremarkable. Upper chest: The imaged lung apices are clear. IMPRESSION: 1. High-grade (approximately 80%) stenosis of the proximal left internal carotid artery due to mixed but primarily noncalcified plaque. 2. Otherwise mild  calcified plaque in the vasculature of the neck without other hemodynamically significant stenosis or occlusion. 3. Advanced multilevel degenerative change of the cervical spine with a least moderate to severe spinal canal stenosis at C4-C5, similar to the cervical spine CT from 01/23/2015 Electronically Signed   By: Valetta Mole M.D.   On: 03/07/2022 09:07   VAS [place] LOWER EXTREMITY VENOUS (DVT)  Result Date: 03/05/2022  Lower Venous DVT Study Patient Name:  MAECY PODGURSKI  Date of Exam:   03/05/2022 Medical Rec #: [id number]       Accession #:    [id number] Date of Birth: January 31, 1935       Patient Gender: F Patient Age:   21 years Exam Location:  [facility] Procedure:      VAS [place] LOWER EXTREMITY VENOUS (DVT) Referring Phys: Oren Binet --------------------------------------------------------------------------------  Indications: Stroke.  Limitations: Body habitus. Comparison Study: No prior study Performing Technologist: Jody Hill RVT, RDMS  Examination Guidelines: A complete evaluation includes B-mode imaging, spectral Doppler, color Doppler, and power Doppler as needed of all accessible portions of each vessel. Bilateral testing is considered an integral part of a complete examination. Limited examinations for reoccurring indications may be performed as noted. The reflux portion of the exam is performed with the patient in reverse Trendelenburg.  +---------+---------------+---------+-----------+----------+--------------+ RIGHT    CompressibilityPhasicitySpontaneityPropertiesThrombus Aging +---------+---------------+---------+-----------+----------+--------------+ CFV      Full           Yes      Yes                                 +---------+---------------+---------+-----------+----------+--------------+ SFJ      Full                                                        +---------+---------------+---------+-----------+----------+--------------+ FV Prox  Full                                                         +---------+---------------+---------+-----------+----------+--------------+ FV Mid   Full                                                        +---------+---------------+---------+-----------+----------+--------------+ FV DistalFull                                                        +---------+---------------+---------+-----------+----------+--------------+ PFV      Full                                                        +---------+---------------+---------+-----------+----------+--------------+  POP      Full           No       Yes                                 +---------+---------------+---------+-----------+----------+--------------+ PTV      Full                                                        +---------+---------------+---------+-----------+----------+--------------+ PERO     Full                                                        +---------+---------------+---------+-----------+----------+--------------+   +---------+---------------+---------+-----------+----------+--------------+ LEFT     CompressibilityPhasicitySpontaneityPropertiesThrombus Aging +---------+---------------+---------+-----------+----------+--------------+ CFV      Full           Yes      Yes                                 +---------+---------------+---------+-----------+----------+--------------+ SFJ      Full                                                        +---------+---------------+---------+-----------+----------+--------------+ FV Prox  Full                                                        +---------+---------------+---------+-----------+----------+--------------+ FV Mid   Full                                                        +---------+---------------+---------+-----------+----------+--------------+ FV DistalFull                                                         +---------+---------------+---------+-----------+----------+--------------+ PFV      Full                                                        +---------+---------------+---------+-----------+----------+--------------+ POP      Full           Yes      Yes                                 +---------+---------------+---------+-----------+----------+--------------+  PTV      Full                                                        +---------+---------------+---------+-----------+----------+--------------+ PERO     Full                                                        +---------+---------------+---------+-----------+----------+--------------+     Summary: BILATERAL: - No evidence of deep vein thrombosis seen in the lower extremities, bilaterally. - RIGHT: - A cystic structure is found in the popliteal fossa.  LEFT: - A cystic structure is found in the popliteal fossa.  *See table(s) above for measurements and observations. Electronically signed by Monica Martinez MD on 03/05/2022 at 5:35:07 PM.    Final      PERTINENT LAB RESULTS: CBC: Recent Labs    03/06/22 0118 03/07/22 0055  WBC 10.3 16.4*  HGB 8.8* 8.9*  HCT 26.6* 27.6*  PLT 384 442*   CMET CMP     Component Value Date/Time   NA 136 03/07/2022 0055   K 3.7 03/07/2022 0055   CL 102 03/07/2022 0055   CO2 25 03/07/2022 0055   GLUCOSE 166 (H) 03/07/2022 0055   BUN 17 03/07/2022 0055   CREATININE 1.14 (H) 03/07/2022 0055   CALCIUM 9.1 03/07/2022 0055   PROT 7.2 02/25/2022 0654   ALBUMIN 3.9 02/25/2022 0654   AST 22 02/25/2022 0654   ALT 20 02/25/2022 0654   ALKPHOS 67 02/25/2022 0654   BILITOT 0.6 02/25/2022 0654   GFRNONAA 47 (L) 03/07/2022 0055   GFRAA >60 11/02/2016 0508    GFR Estimated Creatinine Clearance: 40.6 mL/min (A) (by C-G formula based on SCr of 1.14 mg/dL (H)). No results for input(s): "LIPASE", "AMYLASE" in the last 72 hours. No results for input(s): "CKTOTAL", "CKMB",  "CKMBINDEX", "TROPONINI" in the last 72 hours. Invalid input(s): "POCBNP" No results for input(s): "DDIMER" in the last 72 hours. No results for input(s): "HGBA1C" in the last 72 hours. No results for input(s): "CHOL", "HDL", "LDLCALC", "TRIG", "CHOLHDL", "LDLDIRECT" in the last 72 hours. No results for input(s): "TSH", "T4TOTAL", "T3FREE", "THYROIDAB" in the last 72 hours.  Invalid input(s): "FREET3" No results for input(s): "VITAMINB12", "FOLATE", "FERRITIN", "TIBC", "IRON", "RETICCTPCT" in the last 72 hours. Coags: No results for input(s): "INR" in the last 72 hours.  Invalid input(s): "PT" Microbiology: No results found for this or any previous visit (from the past 240 hour(s)).  FURTHER DISCHARGE INSTRUCTIONS:  Get Medicines reviewed and adjusted: Please take all your medications with you for your next visit with your Primary MD  Laboratory/radiological data: Please request your Primary MD to go over all hospital tests and procedure/radiological results at the follow up, please ask your Primary MD to get all Hospital records sent to his/her office.  In some cases, they will be blood work, cultures and biopsy results pending at the time of your discharge. Please request that your primary care M.D. goes through all the records of your hospital data and follows up on these results.  Also Note the following: If you experience worsening of your admission symptoms, develop shortness of breath, life  threatening emergency, suicidal or homicidal thoughts you must seek medical attention immediately by calling 911 or calling your MD immediately  if symptoms less severe.  You must read complete instructions/literature along with all the possible adverse reactions/side effects for all the Medicines you take and that have been prescribed to you. Take any new Medicines after you have completely understood and accpet all the possible adverse reactions/side effects.   Do not drive when taking Pain  medications or sleeping medications (Benzodaizepines)  Do not take more than prescribed Pain, Sleep and Anxiety Medications. It is not advisable to combine anxiety,sleep and pain medications without talking with your primary care practitioner  Special Instructions: If you have smoked or chewed Tobacco  in the last 2 yrs please stop smoking, stop any regular Alcohol  and or any Recreational drug use.  Wear Seat belts while driving.  Please note: You were cared for by a hospitalist during your hospital stay. Once you are discharged, your primary care physician will handle any further medical issues. Please note that NO REFILLS for any discharge medications will be authorized once you are discharged, as it is imperative that you return to your primary care physician (or establish a relationship with a primary care physician if you do not have one) for your post hospital discharge needs so that they can reassess your need for medications and monitor your lab values.  Total Time spent coordinating discharge including counseling, education and face to face time equals greater than 30 minutes.  SignedOren Binet 03/07/2022 11:52 AM

## 2022-03-07 NOTE — Plan of Care (Signed)
Problem: Education: Goal: Ability to describe self-care measures that may prevent or decrease complications (Diabetes Survival Skills Education) will improve 03/07/2022 1233 by Rosey Bath, RN Outcome: Adequate for Discharge 03/07/2022 1022 by Rosey Bath, RN Outcome: Progressing Goal: Individualized Educational Video(s) 03/07/2022 1233 by Rosey Bath, RN Outcome: Adequate for Discharge 03/07/2022 1022 by Rosey Bath, RN Outcome: Progressing   Problem: Coping: Goal: Ability to adjust to condition or change in health will improve 03/07/2022 1233 by Rosey Bath, RN Outcome: Adequate for Discharge 03/07/2022 1022 by Rosey Bath, RN Outcome: Progressing   Problem: Fluid Volume: Goal: Ability to maintain a balanced intake and output will improve 03/07/2022 1233 by Rosey Bath, RN Outcome: Adequate for Discharge 03/07/2022 1022 by Rosey Bath, RN Outcome: Progressing   Problem: Health Behavior/Discharge Planning: Goal: Ability to identify and utilize available resources and services will improve 03/07/2022 1233 by Rosey Bath, RN Outcome: Adequate for Discharge 03/07/2022 1022 by Rosey Bath, RN Outcome: Progressing Goal: Ability to manage health-related needs will improve 03/07/2022 1233 by Rosey Bath, RN Outcome: Adequate for Discharge 03/07/2022 1022 by Rosey Bath, RN Outcome: Progressing   Problem: Metabolic: Goal: Ability to maintain appropriate glucose levels will improve 03/07/2022 1233 by Rosey Bath, RN Outcome: Adequate for Discharge 03/07/2022 1022 by Rosey Bath, RN Outcome: Progressing   Problem: Nutritional: Goal: Maintenance of adequate nutrition will improve 03/07/2022 1233 by Rosey Bath, RN Outcome: Adequate for Discharge 03/07/2022 1022 by Rosey Bath, RN Outcome: Progressing Goal: Progress toward achieving an optimal weight will improve 03/07/2022 1233 by Rosey Bath, RN Outcome: Adequate for Discharge 03/07/2022 1022 by Rosey Bath, RN Outcome: Progressing   Problem: Skin Integrity: Goal: Risk for impaired skin integrity will decrease 03/07/2022 1233 by Rosey Bath, RN Outcome: Adequate for Discharge 03/07/2022 1022 by Rosey Bath, RN Outcome: Progressing   Problem: Tissue Perfusion: Goal: Adequacy of tissue perfusion will improve 03/07/2022 1233 by Rosey Bath, RN Outcome: Adequate for Discharge 03/07/2022 1022 by Rosey Bath, RN Outcome: Progressing   Problem: Education: Goal: Understanding of CV disease, CV risk reduction, and recovery process will improve 03/07/2022 1233 by Rosey Bath, RN Outcome: Adequate for Discharge 03/07/2022 1022 by Rosey Bath, RN Outcome: Progressing Goal: Individualized Educational Video(s) 03/07/2022 1233 by Rosey Bath, RN Outcome: Adequate for Discharge 03/07/2022 1022 by Rosey Bath, RN Outcome: Progressing   Problem: Activity: Goal: Ability to return to baseline activity level will improve 03/07/2022 1233 by Rosey Bath, RN Outcome: Adequate for Discharge 03/07/2022 1022 by Rosey Bath, RN Outcome: Progressing   Problem: Cardiovascular: Goal: Ability to achieve and maintain adequate cardiovascular perfusion will improve 03/07/2022 1233 by Rosey Bath, RN Outcome: Adequate for Discharge 03/07/2022 1022 by Rosey Bath, RN Outcome: Progressing Goal: Vascular access site(s) Level 0-1 will be maintained 03/07/2022 1233 by Rosey Bath, RN Outcome: Adequate for Discharge 03/07/2022 1022 by Rosey Bath, RN Outcome: Progressing   Problem: Health Behavior/Discharge Planning: Goal: Ability to safely manage health-related needs after discharge will improve 03/07/2022 1233 by Rosey Bath, RN Outcome: Adequate for Discharge 03/07/2022 1022 by Rosey Bath, RN Outcome: Progressing   Problem: Education: Goal:  Knowledge of General Education information will improve Description: Including pain rating scale, medication(s)/side effects and non-pharmacologic comfort measures 03/07/2022 1233 by Rosey Bath, RN Outcome: Adequate for Discharge 03/07/2022 1022 by Rosey Bath, RN Outcome: Progressing   Problem: Health Behavior/Discharge Planning: Goal: Ability to manage health-related needs will improve 03/07/2022 1233 by Rosey Bath, RN Outcome: Adequate for Discharge 03/07/2022 1022 by Rosey Bath, RN Outcome: Progressing   Problem:  Clinical Measurements: Goal: Ability to maintain clinical measurements within normal limits will improve 03/07/2022 1233 by Rosey Bath, RN Outcome: Adequate for Discharge 03/07/2022 1022 by Rosey Bath, RN Outcome: Progressing Goal: Will remain free from infection 03/07/2022 1233 by Rosey Bath, RN Outcome: Adequate for Discharge 03/07/2022 1022 by Rosey Bath, RN Outcome: Progressing Goal: Diagnostic test results will improve 03/07/2022 1233 by Rosey Bath, RN Outcome: Adequate for Discharge 03/07/2022 1022 by Rosey Bath, RN Outcome: Progressing Goal: Respiratory complications will improve 03/07/2022 1233 by Rosey Bath, RN Outcome: Adequate for Discharge 03/07/2022 1022 by Rosey Bath, RN Outcome: Progressing Goal: Cardiovascular complication will be avoided 03/07/2022 1233 by Rosey Bath, RN Outcome: Adequate for Discharge 03/07/2022 1022 by Rosey Bath, RN Outcome: Progressing   Problem: Activity: Goal: Risk for activity intolerance will decrease 03/07/2022 1233 by Rosey Bath, RN Outcome: Adequate for Discharge 03/07/2022 1022 by Rosey Bath, RN Outcome: Progressing   Problem: Nutrition: Goal: Adequate nutrition will be maintained 03/07/2022 1233 by Rosey Bath, RN Outcome: Adequate for Discharge 03/07/2022 1022 by Rosey Bath, RN Outcome: Progressing    Problem: Coping: Goal: Level of anxiety will decrease 03/07/2022 1233 by Rosey Bath, RN Outcome: Adequate for Discharge 03/07/2022 1022 by Rosey Bath, RN Outcome: Progressing   Problem: Elimination: Goal: Will not experience complications related to bowel motility 03/07/2022 1233 by Rosey Bath, RN Outcome: Adequate for Discharge 03/07/2022 1022 by Rosey Bath, RN Outcome: Progressing Goal: Will not experience complications related to urinary retention 03/07/2022 1233 by Rosey Bath, RN Outcome: Adequate for Discharge 03/07/2022 1022 by Rosey Bath, RN Outcome: Progressing   Problem: Pain Managment: Goal: General experience of comfort will improve 03/07/2022 1233 by Rosey Bath, RN Outcome: Adequate for Discharge 03/07/2022 1022 by Rosey Bath, RN Outcome: Progressing   Problem: Safety: Goal: Ability to remain free from injury will improve 03/07/2022 1233 by Rosey Bath, RN Outcome: Adequate for Discharge 03/07/2022 1022 by Rosey Bath, RN Outcome: Progressing   Problem: Skin Integrity: Goal: Risk for impaired skin integrity will decrease 03/07/2022 1233 by Rosey Bath, RN Outcome: Adequate for Discharge 03/07/2022 1022 by Rosey Bath, RN Outcome: Progressing

## 2022-03-07 NOTE — TOC Transition Note (Signed)
Transition of Care [facility]) - CM/SW Discharge Note   Patient Details  Name: Kaitlin Gomez MRN: [id number] Date of Birth: 04/03/1935  Transition of Care [facility]) CM/SW Contact:  Cyndi Bender, RN Phone Number: 03/07/2022, 12:00 PM   Clinical Narrative:    Patient is stable for discharge.  Patient continues to decline Home health. No other TOC needs at this time.  Final next level of care: Home/Self Care Barriers to Discharge: Barriers Resolved   Patient Goals and CMS Choice Patient states their goals for this hospitalization and ongoing recovery are:: return home      Discharge Placement  home                     Discharge Plan and Services   Discharge Planning Services: CM Consult                                 Social Determinants of Health (SDOH) Interventions     Readmission Risk Interventions    03/07/2022   12:00 PM  Readmission Risk Prevention Plan  Transportation Screening Complete  PCP or Specialist Appt within 5-7 Days Complete  Home Care Screening Complete  Medication Review (RN CM) Complete

## 2022-03-07 NOTE — Progress Notes (Signed)
Physical Therapy Treatment Patient Details Name: Kaitlin Gomez MRN: [id number] DOB: Aug 07, 1934 Today's Date: 03/07/2022   History of Present Illness 87 y/o female admitted as a tranfer from [facility] to ED 02/25/22 for work up in IR for active bleed in her ascending colon.  S/p US guided embolization of the vasa recta supplying pt's diverticular bleed right colon on 10/1. 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 PMHx: DM2, HLD, HTN, chron's ds s/p sigmoid colectormy.    PT Comments    Pt agreeable to OOB mobility, although reluctant. Pt overall requiring min physical assist to perform bed mobility, transfer OOB, and complete short-distance ambulation. Pt states her son will help her some, but will likely need more help that she will hire. Pt wants to d/c home, states she will have sufficient support to provide min assist. Pt with HR elevation to 130s during gait, asymptomatic. PT to continue to follow acutely.      Recommendations for follow up therapy are one component of a multi-disciplinary discharge planning process, led by the attending physician.  Recommendations may be updated based on patient status, additional functional criteria and insurance authorization.  Follow Up Recommendations  Home health PT     Assistance Recommended at Discharge Frequent or constant Supervision/Assistance  Patient can return home with the following Assistance with cooking/housework;Assist for transportation;A little help with walking and/or transfers;A little help with bathing/dressing/bathroom   Equipment Recommendations  None recommended by PT    Recommendations for Other Services       Precautions / Restrictions Precautions Precautions: Fall Restrictions Weight Bearing Restrictions: No     Mobility  Bed Mobility Overal bed mobility: Needs Assistance Bed Mobility: Supine to Sit, Sit to Supine     Supine to sit: Min  assist Sit to supine: Min assist   General bed mobility comments: assist for trunk elevation, LE lifting into bed upon return to supine. Increased time, cuing for sequencing.    Transfers Overall transfer level: Needs assistance Equipment used: Rolling walker (2 wheels) Transfers: Sit to/from Stand Sit to Stand: Min assist           General transfer comment: assist for power up, pt able to rise and place hands with cuing and increased time. STS x2, from EOB and toilet.    Ambulation/Gait Ambulation/Gait assistance: Min assist Gait Distance (Feet): 50 Feet (+10 to get to bathroom) Assistive device: Rolling walker (2 wheels) Gait Pattern/deviations: Step-through pattern, Trunk flexed, Shuffle Gait velocity: decr     General Gait Details: cues for upright posture, placement in RW, assist to steady and guide RW around obstacles as pt bumping into environment multiple times   Stairs             Wheelchair Mobility    Modified Rankin (Stroke Patients Only)       Balance Overall balance assessment: Needs assistance Sitting-balance support: No upper extremity supported, Feet supported Sitting balance-Leahy Scale: Fair     Standing balance support: Bilateral upper extremity supported, During functional activity, Reliant on assistive device for balance Standing balance-Leahy Scale: Poor                              Cognition Arousal/Alertness: Awake/alert Behavior During Therapy: WFL for tasks assessed/performed Overall Cognitive Status: Impaired/Different from baseline Area of Impairment: Following commands, Problem solving, Awareness, Attention, Safety/judgement  Current Attention Level: Sustained   Following Commands: Follows one step commands with increased time Safety/Judgement: Decreased awareness of deficits, Decreased awareness of safety   Problem Solving: Slow processing, Requires verbal cues General Comments: pt  incontinent of stool once standing, pt with no awareness. Pt requiring assist for all mobility and pericare, when PT asks pt what her plan is for toileting and mobility once home pt states she will be able to do it obviously lacking insight into current deficits. Pt laughs inappropriately during session at times        Exercises      General Comments General comments (skin integrity, edema, etc.): HRmax observed 132 bpm during mobility      Pertinent Vitals/Pain Pain Assessment Pain Assessment: No/denies pain Pain Intervention(s): Monitored during session    Home Living                          Prior Function            PT Goals (current goals can now be found in the care plan section) Acute Rehab PT Goals PT Goal Formulation: With patient Time For Goal Achievement: 03/12/22 Potential to Achieve Goals: Good Progress towards PT goals: Progressing toward goals    Frequency    Min 3X/week      PT Plan Current plan remains appropriate    Co-evaluation              AM-PAC PT "6 Clicks" Mobility   Outcome Measure  Help needed turning from your back to your side while in a flat bed without using bedrails?: A Little Help needed moving from lying on your back to sitting on the side of a flat bed without using bedrails?: A Little Help needed moving to and from a bed to a chair (including a wheelchair)?: A Little Help needed standing up from a chair using your arms (e.g., wheelchair or bedside chair)?: A Little Help needed to walk in hospital room?: A Little Help needed climbing 3-5 steps with a railing? : A Lot 6 Click Score: 17    End of Session   Activity Tolerance: Patient limited by fatigue Patient left: with call bell/phone within reach;in bed;with bed alarm set;Other (comment) (pt requests all 4 rails up) Nurse Communication: Mobility status PT Visit Diagnosis: Unsteadiness on feet (R26.81);Muscle weakness (generalized) (M62.81)     Time:  4917-9150 PT Time Calculation (min) (ACUTE ONLY): 29 min  Charges:  $Gait Training: 8-22 mins $Therapeutic Activity: 8-22 mins                     Stacie Glaze, PT DPT Acute Rehabilitation Services Pager [phone]  Office [phone]    [place] 03/07/2022, 2:59 PM

## 2022-03-07 NOTE — Progress Notes (Signed)
Pt ordered to discharge home. AVS reviewed with pt. Education provided as needed. Patient verbalized understanding. All questions answered.  

## 2022-03-07 NOTE — Progress Notes (Signed)
Patient ID: Kaitlin Gomez, female   DOB: 23-May-1935, 87 y.o.   MRN: [id number] Patient enrolled for [employer] to ship a 30 day cardiac event monitor to her home.  Letter with instructions mailed to patient.  Dr. Audie Box to read.

## 2022-03-07 NOTE — Progress Notes (Signed)
Mobility Specialist Progress Note:   03/07/22 1025  Mobility  Activity Transferred to/from [facility];Transferred from bed to chair;Ambulated with assistance in room  Level of Assistance Minimal assist, patient does 75% or more  Assistive Device Front wheel walker  Distance Ambulated (ft) 10 ft  Activity Response Tolerated well  Mobility Referral Yes  $Mobility charge 1 Mobility   Pt agreeable to mobility session. Requesting transfer to [facility] to void. Void successful. Pt transferred to [facility] with [facility]. Left with all needs met, chair alarm on.  Nelta Numbers Acute Rehab Secure Chat or Office Phone: [phone]

## 2022-03-13 ENCOUNTER — Other Ambulatory Visit: Payer: Self-pay | Admitting: *Deleted

## 2022-03-13 DIAGNOSIS — I6529 Occlusion and stenosis of unspecified carotid artery: Secondary | ICD-10-CM

## 2022-03-29 ENCOUNTER — Ambulatory Visit (HOSPITAL_COMMUNITY): Payer: Medicare HMO | Attending: Vascular Surgery

## 2022-03-29 ENCOUNTER — Encounter: Payer: Medicare HMO | Admitting: Vascular Surgery

## 2022-04-09 ENCOUNTER — Ambulatory Visit: Payer: Medicare HMO | Admitting: Cardiovascular Disease

## 2022-05-01 NOTE — Progress Notes (Signed)
Cardiology Office Note:   Date:  05/04/2022  NAME:  Kaitlin Gomez    MRN: [id number] DOB:  22-Nov-1934   PCP:  Marguerita Merles, MD  Cardiologist:  None  Electrophysiologist:  None   Referring MD: Marguerita Merles, MD   Chief Complaint  Patient presents with   New Patient (Initial Visit)    History of Present Illness:   Kaitlin Gomez is a 87 y.o. female with a hx of stroke, hypertension, carotid artery disease, hyperlipidemia who is being seen today for the evaluation of stroke at the request of Delight Stare, MD. admitted to the hospital in October with GI bleed.  Found to have acute ischemic strokes thought to be related to embolic phenomenon.  Found to have left ICA 80-99%.  Plan was for outpatient revascularization. She has not followed with vascular surgery.  She had an 80% left carotid artery stenosis on CTA.  Appears the plan was unclear.  She really never had strokelike symptoms.  She was asymptomatic.  A monitor was recommended.  To me this likely represents stroke from her carotid artery disease.  We discussed that she does not have to do a monitor if she does not want to.  Her echo was normal.  Her cholesterol is well-controlled.  Her medical history is significant for hypertension.  She has elevated blood pressure today.  Reports values are controlled at home.  She will keep an eye on this.  She is active without chest pain or trouble breathing.  She is diabetic with an A1c of 6.3.  Her cholesterol levels are well-controlled.  Her EKG demonstrates sinus rhythm with no acute ischemic changes or evidence of infarction.  She is a never smoker.  Denies alcohol or drug use.  She is retired.  Worked for [employer].  She is widowed.  She has 1 son.  No grandchildren.  She overall is without any complaints today.  Problem List CVA -02/2022: acute ischemic infarcts in bilateral cerebral hemispheres  2. Carotid artery stenosis -L ICA 80-99% 3. GI bleed -diverticular bleeding  ascending colon s/p embolization 02/2022 4. HTN 5. HLD -T chol 118, HDL 41, LDL 65, TG 60  Past Medical History: Past Medical History:  Diagnosis Date   Crohn's disease ([place])    Diabetes mellitus without complication ([place])    Hyperlipemia    Hypertension     Past Surgical History: Past Surgical History:  Procedure Laterality Date   COLONOSCOPY WITH PROPOFOL N/A 01/05/2015   Procedure: COLONOSCOPY WITH PROPOFOL;  Surgeon: Lollie Sails, MD;  Location[facility] ENDOSCOPY;  Service: Endoscopy;  Laterality: N/A;   COLONOSCOPY WITH PROPOFOL N/A 11/02/2016   Procedure: COLONOSCOPY WITH PROPOFOL;  Surgeon: Jonathon Bellows, MD;  Location: [facility] ENDOSCOPY;  Service: Endoscopy;  Laterality: N/A;   IR ANGIOGRAM SELECTIVE EACH ADDITIONAL VESSEL  02/25/2022   IR ANGIOGRAM SELECTIVE EACH ADDITIONAL VESSEL  02/25/2022   IR ANGIOGRAM SELECTIVE EACH ADDITIONAL VESSEL  02/25/2022   IR ANGIOGRAM VISCERAL SELECTIVE  02/25/2022   IR EMBO ART  VEN HEMORR LYMPH EXTRAV  INC GUIDE ROADMAPPING  02/25/2022   IR US GUIDE VASC ACCESS RIGHT  02/25/2022    Current Medications: Current Meds  Medication Sig   amLODipine (NORVASC) 10 MG tablet Take 10 mg by mouth daily.   aspirin EC 81 MG tablet Take 81 mg by mouth daily. Swallow whole.   calcium-vitamin D (OSCAL 500/200 D-3) 500-200 MG-UNIT per tablet Take 1 tablet by mouth daily.   furosemide (LASIX)  40 MG tablet Take 40 mg by mouth daily.   lisinopril (ZESTRIL) 40 MG tablet Take 40 mg by mouth daily.   lovastatin (MEVACOR) 40 MG tablet Take 40 mg by mouth at bedtime.   metFORMIN (GLUCOPHAGE-XR) 500 MG 24 hr tablet Take 500 mg by mouth daily.   metoprolol tartrate (LOPRESSOR) 50 MG tablet Take 1 tablet (50 mg total) by mouth 2 (two) times daily.   ONETOUCH ULTRA test strip 1 each by Other route.   [DISCONTINUED] atenolol (TENORMIN) 50 MG tablet Take 50 mg by mouth daily.     Allergies:    Advil [ibuprofen]   Social History: Social History   Socioeconomic History    Marital status: Divorced    Spouse name: Not on file   Number of children: 1   Years of education: Not on file   Highest education level: Not on file  Occupational History   Occupation: [employer] - Retired  Tobacco Use   Smoking status: Never   Smokeless tobacco: Never  Substance and Sexual Activity   Alcohol use: No   Drug use: No   Sexual activity: Not on file  Other Topics Concern   Not on file  Social History Narrative   Not on file   Social Determinants of Health   Financial Resource Strain: Not on file  Food Insecurity: No Food Insecurity (02/26/2022)   Hunger Vital Sign    Worried About Running Out of Food in the Last Year: Never true    Ran Out of Food in the Last Year: Never true  Transportation Needs: No Transportation Needs (02/26/2022)   PRAPARE - Hydrologist (Medical): No    Lack of Transportation (Non-Medical): No  Physical Activity: Not on file  Stress: Not on file  Social Connections: Not on file     Family History: The patient's family history includes Hypertension in her father.  ROS:   All other ROS reviewed and negative. Pertinent positives noted in the HPI.     EKGs/Labs/Other Studies Reviewed:   The following studies were personally reviewed by me today:  EKG:  EKG is ordered today.  The ekg ordered today demonstrates normal sinus rhythm heart 73, first-degree block, no acute ischemic changes, and was personally reviewed by me.   TTE 02/26/2022  1. Left ventricular ejection fraction, by estimation, is 60 to 65%. The  left ventricle has normal function. The left ventricle has no regional  wall motion abnormalities. There is mild left ventricular hypertrophy.  Left ventricular diastolic parameters  are consistent with Grade I diastolic dysfunction (impaired relaxation).   2. Right ventricular systolic function is normal. The right ventricular  size is normal. There is normal pulmonary artery systolic  pressure. The  estimated right ventricular systolic pressure is 85.6 mmHg.   3. The mitral valve is degenerative. No evidence of mitral valve  regurgitation. Moderate mitral annular calcification.   4. The aortic valve was not well visualized. Aortic valve regurgitation  is not visualized. No aortic stenosis is present.   Recent Labs: 02/25/2022: ALT 20; Magnesium 1.5; TSH 0.811 03/07/2022: BUN 17; Creatinine, Ser 1.14; Hemoglobin 8.9; Platelets 442; Potassium 3.7; Sodium 136   Recent Lipid Panel    Component Value Date/Time   CHOL 118 03/04/2022 0440   TRIG 60 03/04/2022 0440   HDL 41 03/04/2022 0440   CHOLHDL 2.9 03/04/2022 0440   VLDL 12 03/04/2022 0440   LDLCALC 65 03/04/2022 0440    Physical Exam:  VS:  BP (!) 152/74 (BP Location: Left Arm, Patient Position: Sitting, Cuff Size: Large)   Pulse 73   Ht '5\' 7"'$  (1.702 m)   Wt 211 lb (95.7 kg)   SpO2 96%   BMI 33.05 kg/m    Wt Readings from Last 3 Encounters:  05/04/22 211 lb (95.7 kg)  02/25/22 189 lb (85.7 kg)  10/30/16 185 lb (83.9 kg)    General: Well nourished, well developed, in no acute distress Head: Atraumatic, normal size  Eyes: PEERLA, EOMI  Neck: Left carotid bruit noted Endocrine: No thryomegaly Cardiac: Normal S1, S2; RRR; no murmurs, rubs, or gallops Lungs: Clear to auscultation bilaterally, no wheezing, rhonchi or rales  Abd: Soft, nontender, no hepatomegaly  Ext: No edema, pulses 2+ Musculoskeletal: No deformities, BUE and BLE strength normal and equal Skin: Warm and dry, no rashes   Neuro: Alert and oriented to person, place, time, and situation, CNII-XII grossly intact, no focal deficits  Psych: Normal mood and affect   ASSESSMENT:   Kaitlin Gomez is a 87 y.o. female who presents for the following: 1. Cerebrovascular accident (CVA), unspecified mechanism ([place])   2. Stenosis of left carotid artery   3. Mixed hyperlipidemia     PLAN:   1. Cerebrovascular accident (CVA), unspecified mechanism  ([place]) 2. Stenosis of left carotid artery 3. Mixed hyperlipidemia -Admitted to the hospital October for diverticular bleed.  Found to have subacute infarcts.  Found to have severe stenosis of the left internal carotid artery.  Plan was for outpatient revascularization.  Seems she has not seen vascular surgery.  We will refer her back to vascular surgery.  She will continue aspirin 81 mg daily.  I suspect her stroke is related to high-grade carotid artery disease.  Her echo was normal.  I see no need for a monitor at this time.  Her cholesterol level is well-controlled.  She will keep a close eye on her blood pressure.  Her diabetes is controlled as well.  She will see [place] back as needed.  Given the high-grade carotid artery disease I do not see a need for a monitor at this time.  Disposition: Return if symptoms worsen or fail to improve.  Medication Adjustments/Labs and Tests Ordered: Current medicines are reviewed at length with the patient today.  Concerns regarding medicines are outlined above.  Orders Placed This Encounter  Procedures   Ambulatory referral to Vascular Surgery   EKG 12-Lead   No orders of the defined types were placed in this encounter.   Patient Instructions  Medication Instructions:  The current medical regimen is effective;  continue present plan and medications.  *If you need a refill on your cardiac medications before your next appointment, please call your pharmacy*   Follow-Up: At [facility], you and your health needs are our priority.  As part of our continuing mission to provide you with exceptional heart care, we have created designated Provider Care Teams.  These Care Teams include your primary Cardiologist (physician) and Advanced Practice Providers (APPs -  Physician Assistants and Nurse Practitioners) who all work together to provide you with the care you need, when you need it.  We recommend signing up for the patient portal called "MyChart".   Sign up information is provided on this After Visit Summary.  MyChart is used to connect with patients for Virtual Visits (Telemedicine).  Patients are able to view lab/test results, encounter notes, upcoming appointments, etc.  Non-urgent messages can be sent to your provider  as well.   To learn more about what you can do with MyChart, go to [email or web address].    Your next appointment:   As needed  The format for your next appointment:   In Person  Provider:   Eleonore Chiquito, MD    We have referred you over to [facility]- they will contact you for an appointment.           Signed, Addison Naegeli. Audie Box, MD, [facility]  [address], [place], [place] [address] [phone]  05/04/2022 3:00 PM

## 2022-05-02 ENCOUNTER — Ambulatory Visit: Payer: Medicare HMO | Admitting: Cardiovascular Disease

## 2022-05-04 ENCOUNTER — Encounter: Payer: Self-pay | Admitting: Cardiovascular Disease

## 2022-05-04 ENCOUNTER — Ambulatory Visit: Payer: Medicare HMO | Attending: Cardiovascular Disease | Admitting: Cardiovascular Disease

## 2022-05-04 VITALS — BP 152/74 | HR 73 | Ht 67.0 in | Wt 211.0 lb

## 2022-05-04 DIAGNOSIS — I639 Cerebral infarction, unspecified: Secondary | ICD-10-CM | POA: Diagnosis not present

## 2022-05-04 DIAGNOSIS — E782 Mixed hyperlipidemia: Secondary | ICD-10-CM

## 2022-05-04 DIAGNOSIS — I6522 Occlusion and stenosis of left carotid artery: Secondary | ICD-10-CM

## 2022-05-04 NOTE — Patient Instructions (Signed)
Medication Instructions:  The current medical regimen is effective;  continue present plan and medications.  *If you need a refill on your cardiac medications before your next appointment, please call your pharmacy*   Follow-Up: At [facility], you and your health needs are our priority.  As part of our continuing mission to provide you with exceptional heart care, we have created designated Provider Care Teams.  These Care Teams include your primary Cardiologist (physician) and Advanced Practice Providers (APPs -  Physician Assistants and Nurse Practitioners) who all work together to provide you with the care you need, when you need it.  We recommend signing up for the patient portal called "MyChart".  Sign up information is provided on this After Visit Summary.  MyChart is used to connect with patients for Virtual Visits (Telemedicine).  Patients are able to view lab/test results, encounter notes, upcoming appointments, etc.  Non-urgent messages can be sent to your provider as well.   To learn more about what you can do with MyChart, go to [email or web address].    Your next appointment:   As needed  The format for your next appointment:   In Person  Provider:   Eleonore Chiquito, MD    We have referred you over to [facility]- they will contact you for an appointment.

## 2022-05-31 ENCOUNTER — Encounter: Payer: Medicare HMO | Admitting: Vascular Surgery

## 2022-05-31 ENCOUNTER — Encounter (HOSPITAL_COMMUNITY): Payer: Medicare HMO

## 2022-06-07 ENCOUNTER — Encounter: Payer: Self-pay | Admitting: Vascular Surgery

## 2022-06-07 ENCOUNTER — Ambulatory Visit (HOSPITAL_COMMUNITY)
Admission: RE | Admit: 2022-06-07 | Discharge: 2022-06-07 | Disposition: A | Discharge status: Home / Self Care | Payer: Medicare HMO | Source: Ambulatory Visit | Attending: Vascular Surgery | Admitting: Vascular Surgery

## 2022-06-07 ENCOUNTER — Ambulatory Visit: Payer: Medicare HMO | Admitting: Vascular Surgery

## 2022-06-07 VITALS — BP 157/77 | HR 89 | Temp 98.0°F | Resp 20 | Ht 67.0 in | Wt 204.0 lb

## 2022-06-07 DIAGNOSIS — I6522 Occlusion and stenosis of left carotid artery: Secondary | ICD-10-CM | POA: Diagnosis not present

## 2022-06-07 DIAGNOSIS — I6529 Occlusion and stenosis of unspecified carotid artery: Secondary | ICD-10-CM | POA: Insufficient documentation

## 2022-06-07 NOTE — Progress Notes (Signed)
REASON FOR VISIT:   Follow-up of carotid disease.  MEDICAL ISSUES:   LEFT CAROTID STENOSIS: This patient has an asymptomatic 60 to 79% left carotid stenosis.  She is on aspirin and is on a statin.  I explained that in a normal risk patient we would not consider carotid endarterectomy less the stenosis progressed to greater than 80%.  I ordered a follow-up carotid duplex scan in 6 months and I will see her at that time.  If the stenosis does progress this would certainly be technically challenging given her severe degenerative disc disease in her neck and very limited neck mobility.  I will see her in 6 months.  She knows to call sooner if she has problems.  HPI:   Kaitlin Gomez is a pleasant 88 y.o. female who I saw in consultation on 03/06/2022 with an asymptomatic left carotid stenosis.  The end-diastolic velocity on the duplex was 105 cm/s so I was not totally convinced that the stenosis was greater than 80%.  In addition she had extremely limited neck mobility and I was not sure if technically she would be a candidate for left carotid endarterectomy.  I recommended a follow-up duplex scan and the patient comes in to discuss these results.  Since I saw her last, she denies any history of stroke, TIAs, expressive or receptive aphasia, or amaurosis fugax.  She is on aspirin and is on a statin.  Past Medical History:  Diagnosis Date   Carotid artery occlusion    Crohn's disease ([place])    Diabetes mellitus without complication ([place] [place])    Hyperlipemia    Hypertension     Family History  Problem Relation Age of Onset   Hypertension Father     SOCIAL HISTORY: Social History   Tobacco Use   Smoking status: Never   Smokeless tobacco: Never  Substance Use Topics   Alcohol use: No    Allergies  Allergen Reactions   Advil [Ibuprofen] Rash    Current Outpatient Medications  Medication Sig Dispense Refill   amLODipine (NORVASC) 10 MG tablet Take 10 mg by mouth daily.      aspirin EC 81 MG tablet Take 81 mg by mouth daily. Swallow whole.     calcium-vitamin D (OSCAL 500/200 D-3) 500-200 MG-UNIT per tablet Take 1 tablet by mouth daily.     furosemide (LASIX) 40 MG tablet Take 40 mg by mouth daily.     lisinopril (ZESTRIL) 40 MG tablet Take 40 mg by mouth daily.     lovastatin (MEVACOR) 40 MG tablet Take 40 mg by mouth at bedtime.     metFORMIN (GLUCOPHAGE-XR) 500 MG 24 hr tablet Take 500 mg by mouth daily.     metoprolol tartrate (LOPRESSOR) 50 MG tablet Take 1 tablet (50 mg total) by mouth 2 (two) times daily. 60 tablet 2   ONETOUCH ULTRA test strip 1 each by Other route.     No current facility-administered medications for this visit.    REVIEW OF SYSTEMS:  '[X]'$  denotes positive finding, '[ ]'$  denotes negative finding Cardiac  Comments:  Chest pain or chest pressure:    Shortness of breath upon exertion:    Short of breath when lying flat:    Irregular heart rhythm:        Vascular    Pain in calf, thigh, or hip brought on by ambulation:    Pain in feet at night that wakes you up from your sleep:     Blood clot  in your veins:    Leg swelling:         Pulmonary    Oxygen at home:    Productive cough:     Wheezing:         Neurologic    Sudden weakness in arms or legs:     Sudden numbness in arms or legs:     Sudden onset of difficulty speaking or slurred speech:    Temporary loss of vision in one eye:     Problems with dizziness:         Gastrointestinal    Blood in stool:     Vomited blood:         Genitourinary    Burning when urinating:     Blood in urine:        Psychiatric    Major depression:         Hematologic    Bleeding problems:    Problems with blood clotting too easily:        Skin    Rashes or ulcers:        Constitutional    Fever or chills:     PHYSICAL EXAM:   Vitals:   06/07/22 1039 06/07/22 1042  BP: 120/68 (!) 157/77  Pulse: 89   Resp: 20   Temp: 98 F (36.7 C)   SpO2: 97%   Weight: 204 lb (92.5 kg)    Height: '5\' 7"'$  (1.702 m)     GENERAL: The patient is a well-nourished female, in no acute distress. The vital signs are documented above. CARDIAC: There is a regular rate and rhythm.  VASCULAR: I do not detect carotid bruits. She has significant bilateral lower extremity edema consistent with lymphedema. Both feet are warm and well-perfused. PULMONARY: There is good air exchange bilaterally without wheezing or rales. ABDOMEN: Soft and non-tender with normal pitched bowel sounds.  MUSCULOSKELETAL: There are no major deformities or cyanosis. NEUROLOGIC: No focal weakness or paresthesias are detected. SKIN: There are no ulcers or rashes noted. PSYCHIATRIC: The patient has a normal affect.  DATA:    LIMITED CAROTID DUPLEX: I have independently interpreted the carotid duplex of the left carotid only.  This shows evidence of a 60 to 79% left carotid stenosis.  Peak systolic velocity is 078 cm/s with an end-diastolic velocity of 675 cm/s.  The left vertebral artery is patent with antegrade flow.  Deitra Mayo [facility] [phone]

## 2022-06-13 ENCOUNTER — Other Ambulatory Visit: Payer: Self-pay

## 2022-06-13 DIAGNOSIS — I6529 Occlusion and stenosis of unspecified carotid artery: Secondary | ICD-10-CM

## 2022-09-13 ENCOUNTER — Other Ambulatory Visit: Payer: Self-pay

## 2022-09-13 ENCOUNTER — Inpatient Hospital Stay
Admission: EM | Admit: 2022-09-13 | Discharge: 2022-09-16 | DRG: 378 | Disposition: A | Discharge status: Home / Self Care | Payer: Medicare HMO | Attending: Internal Medicine | Admitting: Internal Medicine

## 2022-09-13 ENCOUNTER — Emergency Department: Payer: Medicare HMO

## 2022-09-13 ENCOUNTER — Encounter: Payer: Self-pay | Admitting: Radiology

## 2022-09-13 DIAGNOSIS — Z7984 Long term (current) use of oral hypoglycemic drugs: Secondary | ICD-10-CM | POA: Diagnosis not present

## 2022-09-13 DIAGNOSIS — E876 Hypokalemia: Secondary | ICD-10-CM | POA: Diagnosis not present

## 2022-09-13 DIAGNOSIS — R001 Bradycardia, unspecified: Secondary | ICD-10-CM | POA: Diagnosis not present

## 2022-09-13 DIAGNOSIS — K922 Gastrointestinal hemorrhage, unspecified: Secondary | ICD-10-CM | POA: Diagnosis present

## 2022-09-13 DIAGNOSIS — Z79899 Other long term (current) drug therapy: Secondary | ICD-10-CM

## 2022-09-13 DIAGNOSIS — I10 Essential (primary) hypertension: Secondary | ICD-10-CM | POA: Diagnosis present

## 2022-09-13 DIAGNOSIS — Z8249 Family history of ischemic heart disease and other diseases of the circulatory system: Secondary | ICD-10-CM

## 2022-09-13 DIAGNOSIS — Z9049 Acquired absence of other specified parts of digestive tract: Secondary | ICD-10-CM

## 2022-09-13 DIAGNOSIS — E119 Type 2 diabetes mellitus without complications: Secondary | ICD-10-CM | POA: Diagnosis present

## 2022-09-13 DIAGNOSIS — E785 Hyperlipidemia, unspecified: Secondary | ICD-10-CM | POA: Diagnosis present

## 2022-09-13 DIAGNOSIS — N200 Calculus of kidney: Secondary | ICD-10-CM | POA: Diagnosis present

## 2022-09-13 DIAGNOSIS — K625 Hemorrhage of anus and rectum: Secondary | ICD-10-CM | POA: Diagnosis present

## 2022-09-13 DIAGNOSIS — Z7982 Long term (current) use of aspirin: Secondary | ICD-10-CM

## 2022-09-13 DIAGNOSIS — Z886 Allergy status to analgesic agent status: Secondary | ICD-10-CM | POA: Diagnosis not present

## 2022-09-13 DIAGNOSIS — I700 Atherosclerosis of aorta: Secondary | ICD-10-CM | POA: Diagnosis present

## 2022-09-13 DIAGNOSIS — T447X5A Adverse effect of beta-adrenoreceptor antagonists, initial encounter: Secondary | ICD-10-CM | POA: Diagnosis not present

## 2022-09-13 DIAGNOSIS — Y92239 Unspecified place in hospital as the place of occurrence of the external cause: Secondary | ICD-10-CM | POA: Diagnosis not present

## 2022-09-13 DIAGNOSIS — R000 Tachycardia, unspecified: Secondary | ICD-10-CM | POA: Diagnosis not present

## 2022-09-13 DIAGNOSIS — K5090 Crohn's disease, unspecified, without complications: Secondary | ICD-10-CM | POA: Diagnosis present

## 2022-09-13 DIAGNOSIS — K5731 Diverticulosis of large intestine without perforation or abscess with bleeding: Principal | ICD-10-CM | POA: Diagnosis present

## 2022-09-13 HISTORY — DX: Gastrointestinal hemorrhage, unspecified: K92.2

## 2022-09-13 LAB — BASIC METABOLIC PANEL
Anion gap: 10 (ref 5–15)
BUN: 20 mg/dL (ref 8–23)
CO2: 22 mmol/L (ref 22–32)
Calcium: 9.5 mg/dL (ref 8.9–10.3)
Chloride: 107 mmol/L (ref 98–111)
Creatinine, Ser: 1.02 mg/dL — ABNORMAL HIGH (ref 0.44–1.00)
GFR, Estimated: 54 mL/min — ABNORMAL LOW (ref >60)
Glucose, Bld: 130 mg/dL — ABNORMAL HIGH (ref 70–99)
Potassium: 3.7 mmol/L (ref 3.5–5.1)
Sodium: 139 mmol/L (ref 135–145)

## 2022-09-13 LAB — HEMOGLOBIN AND HEMATOCRIT, BLOOD
HCT: 36.2 % (ref 36.0–46.0)
HCT: 32.2 % — ABNORMAL LOW (ref 36.0–46.0)
Hemoglobin: 11.4 g/dL — ABNORMAL LOW (ref 12.0–15.0)
Hemoglobin: 10.5 g/dL — ABNORMAL LOW (ref 12.0–15.0)

## 2022-09-13 LAB — HEPATIC FUNCTION PANEL
ALT: 13 U/L (ref 0–44)
AST: 23 U/L (ref 15–41)
Albumin: 4 g/dL (ref 3.5–5.0)
Alkaline Phosphatase: 61 U/L (ref 38–126)
Bilirubin, Direct: 0.1 mg/dL (ref 0.0–0.2)
Indirect Bilirubin: 0.8 mg/dL (ref 0.3–0.9)
Total Bilirubin: 0.9 mg/dL (ref 0.3–1.2)
Total Protein: 7.6 g/dL (ref 6.5–8.1)

## 2022-09-13 LAB — CBC
HCT: 42.9 % (ref 36.0–46.0)
Hemoglobin: 13.4 g/dL (ref 12.0–15.0)
MCH: 27.2 pg (ref 26.0–34.0)
MCHC: 31.2 g/dL (ref 30.0–36.0)
MCV: 87.2 fL (ref 80.0–100.0)
Platelets: 256 10*3/uL (ref 150–400)
RBC: 4.92 MIL/uL (ref 3.87–5.11)
RDW: 16.4 % — ABNORMAL HIGH (ref 11.5–15.5)
WBC: 6 10*3/uL (ref 4.0–10.5)
nRBC: 0 % (ref 0.0–0.2)

## 2022-09-13 LAB — LIPASE, BLOOD: Lipase: 29 U/L (ref 11–51)

## 2022-09-13 LAB — TYPE AND SCREEN
ABO/RH(D): A POS
Antibody Screen: NEGATIVE

## 2022-09-13 LAB — PROTIME-INR
INR: 1.2 (ref 0.8–1.2)
Prothrombin Time: 15.5 seconds — ABNORMAL HIGH (ref 11.4–15.2)

## 2022-09-13 LAB — APTT: aPTT: 26 seconds (ref 24–36)

## 2022-09-13 MED ORDER — SODIUM CHLORIDE 0.9 % IV SOLN: INTRAVENOUS | Status: DC

## 2022-09-13 MED ORDER — HYDRALAZINE HCL 20 MG/ML IJ SOLN
10.0000 mg | Freq: Four times a day (QID) | INTRAMUSCULAR | Status: DC | PRN
  Administered 2022-09-14 – 2022-09-15 (×3): 10 mg via INTRAVENOUS
  Filled 2022-09-13 (×3): qty 1

## 2022-09-13 MED ORDER — PEG 3350-KCL-NA BICARB-NACL 420 G PO SOLR
4000.0000 mL | Freq: Once | ORAL | Status: AC
  Administered 2022-09-13: 4000 mL via ORAL
  Filled 2022-09-13 (×3): qty 4000

## 2022-09-13 MED ORDER — IOHEXOL 350 MG/ML SOLN
100.0000 mL | Freq: Once | INTRAVENOUS | Status: AC | PRN
  Administered 2022-09-13: 100 mL via INTRAVENOUS

## 2022-09-13 NOTE — Assessment & Plan Note (Addendum)
BP on admission 157/64, have since been mildly elevated.   --PRN IV hydralazine  --Resume amlodipine today. --Continue holding other antihypertensives, allow BP room in case of significant bleeding.  (Lasix 40 mg daily, lisinopril 40 mg daily, Lopressor 50 mg BID)

## 2022-09-13 NOTE — Consult Note (Signed)
Chief Complaint: Patient was seen in consultation today for  Chief Complaint  Patient presents with   Rectal Bleeding   at the request of Dr. Fuller Plan  Referring Physician(s): Dr. Fuller Plan  Supervising Physician: Gilmer Mor  Patient Status: [facility] - ED  History of Present Illness: Kaitlin Gomez is a 88 y.o. female with PMHx significant for previous acute GI bleed s/p coil embolization of the vasa recta supplying the diverticular bleed of the right colon with our service 02/27/22, HTN, DM, crohns. Patient presented today to ED with new rectal bleeding starting this morning around 0915 and she states about 2 BM prior to coming to the hospital, she has had intermittent BM here in the ED that are off an on and she describes her BM as "passing clots" she states the bleeding is not quite bright red but also not dark in color. She states this is the first episode of bleeding since her embolization in October. She is on aspirin  and denies taking any other blood thinners or antiplatelet medication. She had a CTA abd pelvis performed and reviewed by my attending Dr. Loreta Ave without any definitive sites of active bleeding, patient is currently stable with hgb of 13.4 and vital signs stable without hypotension or tachycardia. She denies any current chest pain or shortness of breath, she does admit to some lightheadedness and nausea that comes and goes.   Past Medical History:  Diagnosis Date   Carotid artery occlusion    Crohn's disease    Diabetes mellitus without complication    Hyperlipemia    Hypertension     Past Surgical History:  Procedure Laterality Date   COLONOSCOPY WITH PROPOFOL N/A 01/05/2015   Procedure: COLONOSCOPY WITH PROPOFOL;  Surgeon: Christena Deem, MD;  Location: [facility] ENDOSCOPY;  Service: Endoscopy;  Laterality: N/A;   COLONOSCOPY WITH PROPOFOL N/A 11/02/2016   Procedure: COLONOSCOPY WITH PROPOFOL;  Surgeon: Wyline Mood, MD;  Location: [facility] ENDOSCOPY;  Service:  Endoscopy;  Laterality: N/A;   IR ANGIOGRAM SELECTIVE EACH ADDITIONAL VESSEL  02/25/2022   IR ANGIOGRAM SELECTIVE EACH ADDITIONAL VESSEL  02/25/2022   IR ANGIOGRAM SELECTIVE EACH ADDITIONAL VESSEL  02/25/2022   IR ANGIOGRAM VISCERAL SELECTIVE  02/25/2022   IR EMBO ART  VEN HEMORR LYMPH EXTRAV  INC GUIDE ROADMAPPING  02/25/2022   IR US GUIDE VASC ACCESS RIGHT  02/25/2022    Allergies: Advil [ibuprofen]  Medications: Prior to Admission medications   Medication Sig Start Date End Date Taking? Authorizing Provider  amLODipine (NORVASC) 10 MG tablet Take 10 mg by mouth daily. 04/04/22   [provider]  aspirin EC 81 MG tablet Take 81 mg by mouth daily. Swallow whole.    [provider]  calcium-vitamin D (OSCAL 500/200 D-3) 500-200 MG-UNIT per tablet Take 1 tablet by mouth daily.    [provider]  furosemide (LASIX) 40 MG tablet Take 40 mg by mouth daily. 03/13/22   [provider]  lisinopril (ZESTRIL) 40 MG tablet Take 40 mg by mouth daily. 04/04/22   [provider]  lovastatin (MEVACOR) 40 MG tablet Take 40 mg by mouth at bedtime.    [provider]  metFORMIN (GLUCOPHAGE-XR) 500 MG 24 hr tablet Take 500 mg by mouth daily. 10/09/16   [provider]  metoprolol tartrate (LOPRESSOR) 50 MG tablet Take 1 tablet (50 mg total) by mouth 2 (two) times daily. 03/07/22   Ghimire, Werner Lean, MD  [facility] ULTRA test strip 1 each by Other route. 03/13/22  [provider]     Family History  Problem Relation Age of Onset   Hypertension Father     Social History   Socioeconomic History   Marital status: Divorced    Spouse name: Not on file   Number of children: 1   Years of education: Not on file   Highest education level: Not on file  Occupational History   Occupation: [employer] - Retired  Tobacco Use   Smoking status: Never   Smokeless tobacco: Never  Substance and Sexual Activity   Alcohol use: No   Drug  use: No   Sexual activity: Not on file  Other Topics Concern   Not on file  Social History Narrative   Not on file   Social Determinants of Health   Financial Resource Strain: Not on file  Food Insecurity: No Food Insecurity (02/26/2022)   Hunger Vital Sign    Worried About Running Out of Food in the Last Year: Never true    Ran Out of Food in the Last Year: Never true  Transportation Needs: No Transportation Needs (02/26/2022)   PRAPARE - Administrator, Civil Service (Medical): No    Lack of Transportation (Non-Medical): No  Physical Activity: Not on file  Stress: Not on file  Social Connections: Not on file    Review of Systems: A 12 point ROS discussed and pertinent positives are indicated in the HPI above.  All other systems are negative.  Review of Systems  Vital Signs: BP (!) 166/65   Pulse (!) 58   Temp 98 F (36.7 C)   Resp 17   Ht 5' 7.5" (1.715 m)   Wt 200 lb (90.7 kg)   SpO2 100%   BMI 30.86 kg/m   Physical Exam Constitutional:      General: She is not in acute distress.    Appearance: Normal appearance.  HENT:     Head: Normocephalic and atraumatic.  Cardiovascular:     Rate and Rhythm: Regular rhythm. Bradycardia present.  Pulmonary:     Effort: Pulmonary effort is normal. No respiratory distress.     Breath sounds: Normal breath sounds.  Abdominal:     General: There is no distension.     Palpations: Abdomen is soft.     Tenderness: There is no abdominal tenderness.  Musculoskeletal:        General: Swelling present.  Skin:    General: Skin is warm and dry.  Neurological:     Mental Status: She is alert and oriented to person, place, and time.  Psychiatric:        Thought Content: Thought content normal.        Judgment: Judgment normal.     Imaging: CT Angio Abd/Pel W and/or Wo Contrast  Result Date: 09/13/2022 CLINICAL DATA:  88 year old female with lower gastrointestinal hemorrhage. EXAM: CTA ABDOMEN AND PELVIS WITHOUT  AND WITH CONTRAST TECHNIQUE: Multidetector CT imaging of the abdomen and pelvis was performed using the standard protocol during bolus administration of intravenous contrast. Multiplanar reconstructed images and MIPs were obtained and reviewed to evaluate the vascular anatomy. RADIATION DOSE REDUCTION: This exam was performed according to the departmental dose-optimization program which includes automated exposure control, adjustment of the mA and/or kV according to patient size and/or use of iterative reconstruction technique. CONTRAST:  OMNIPAQUE IOHEXOL 350 MG/ML SOLN COMPARISON:  02/25/2022 FINDINGS: VASCULAR Aorta: Normal caliber aorta without aneurysm, dissection, vasculitis or significant stenosis. Scattered atherosclerotic calcifications. Celiac: Patent without evidence  of aneurysm, dissection, vasculitis or significant stenosis. SMA: Patent without evidence of aneurysm, dissection, vasculitis or significant stenosis. Renals: Dual bilateral renal arteries with moderate ostial stenoses about the main bilateral renal artery secondary to atherosclerotic plaque. IMA: Diminutive. Inflow: Patent without evidence of aneurysm, dissection, vasculitis or significant stenosis. Proximal Outflow: Bilateral common femoral and visualized portions of the superficial and profunda femoral arteries are patent without evidence of aneurysm, dissection, vasculitis or significant stenosis. Veins: No obvious venous abnormality within the limitations of this arterial phase study. Review of the MIP images confirms the above findings. NON-VASCULAR Lower chest: No acute abnormality. Hepatobiliary: No focal liver abnormality is seen. No gallstones, gallbladder wall thickening, or biliary dilatation. Pancreas: Similar appearing fatty atrophy in the body and head. No pancreatic ductal dilatation or surrounding inflammatory changes. Spleen: Normal in size without focal abnormality. Adrenals/Urinary Tract: Adrenal glands are  unremarkable. Unchanged appearance of left inferior pole irregular shaped renal calculus measuring up to 6 mm. Kidneys are otherwise normal, without new renal calculi, focal lesion, or hydronephrosis. Bladder is unremarkable. Stomach/Bowel: Stomach is within normal limits. Appendix appears normal. About the mesenteric aspect of the cecum, between the ileal and appendiceal orifices is a focus of hyperenhancement most conspicuous on delayed venous phase (series 19, images 52 and 53, series 16, image 129), less conspicuous on arterial phase. This focus appears to arise from a distal arterial branch just inferior to the previously embolized distal right colic artery branch. Diffuse colonic diverticulosis. No evidence of bowel wall thickening, distention, or inflammatory changes. Lymphatic: No abdominopelvic lymphadenopathy. Reproductive: Status post hysterectomy. No adnexal masses. Other: No abdominal wall hernia or abnormality. No abdominopelvic ascites. Musculoskeletal: No acute osseous abnormality. Similar appearing multilevel degenerative changes of the visualized thoracolumbar spine. IMPRESSION: 1. There is a focus of hyperenhancement in the cecum, most prominent on delayed venous phase, most compatible with etiology of lower gastrointestinal hemorrhage. 2. Pan colonic diverticulosis. 3. Nonobstructive left nephrolithiasis. 4.  Aortic Atherosclerosis (ICD10-I70.0). Marliss Coots, MD [facility] Radiology Electronically Signed   By: Marliss Coots M.D.   On: 09/13/2022 13:39    Labs:  CBC: Recent Labs    03/05/22 0543 03/06/22 0118 03/07/22 0055 09/13/22 1004  WBC 7.8 10.3 16.4* 6.0  HGB 8.4* 8.8* 8.9* 13.4  HCT 25.0* 26.6* 27.6* 42.9  PLT 325 384 442* 256    COAGS: Recent Labs    02/25/22 0654  INR 1.2    BMP: Recent Labs    03/05/22 0543 03/06/22 0118 03/07/22 0055 09/13/22 1004  NA 140 140 136 139  K 3.8 3.7 3.7 3.7  CL 103 103 102  107  CO2 25 26 25 22   GLUCOSE 133* 137* 166* 130*  BUN 16 19 17 20   CALCIUM 9.5 9.3 9.1 9.5  CREATININE 0.93 1.12* 1.14* 1.02*  GFRNONAA >60 48* 47* 54*    LIVER FUNCTION TESTS: Recent Labs    02/25/22 0654 09/13/22 1004  BILITOT 0.6 0.9  AST 22 23  ALT 20 13  ALKPHOS 67 61  PROT 7.2 7.6  ALBUMIN 3.9 4.0    Assessment and Plan: This is a 88 year old female with PMHx significant for previous acute GI bleed s/p coil embolization of the vasa recta supplying the diverticular bleed of the right colon with our service 02/27/22, she has had previous GI bleeds in years prior, HTN, DM, crohns. Patient presented today to ED with new rectal bleeding starting this morning. CTA has been performed and reviewed by  my attending Dr. Loreta Ave, without any definitive sites of active bleeding that require embolization. The patient is currently stable with hgb of 13.4 and vital signs stable without hypotension or tachycardia.   Recommendation at this point would be continue to follow H/H and closely observe, would recommend GI consult for possible endoscopic evaluation given that this is recurrent for further evaluation. Patient denies any recent colonoscopy evaluation, per chart review last performed in 2018.  IR will continue to follow, please do not hesitate to reach out with questions or if the patient becomes unstable.    Thank you for this interesting consult.  I greatly enjoyed meeting BEN SANZ and look forward to participating in their care.  A copy of this report was sent to the requesting provider on this date.  Electronically Signed: Berneta Levins, PA-C 09/13/2022, 4:06 PM   I spent a total of 20 Minutes  in face to face in clinical consultation, greater than 50% of which was counseling/coordinating care for lower GI bleed.

## 2022-09-13 NOTE — H&P (Addendum)
History and Physical    Patient: Kaitlin Gomez [id number]:[id number] DOB: 1934-09-29 DOA: 09/13/2022 DOS: the patient was seen and examined on 09/13/2022 PCP: Leanna Sato, MD  Patient coming from: Home  Chief Complaint:  Chief Complaint  Patient presents with   Rectal Bleeding   HPI: Kaitlin Gomez is a 88 y.o. female with medical history significant of diverticulosis with lower GI bleeding in October 2023 s/p embolization procedure, Crohn's disease, HTN, bradycardia who presented to the ED this AM for evaluation of rectal bleeding, maroon in color.  Pt reports onset around 9:00 this AM.  Denies associated abdominal pain.  Does endorse nausea with no vomiting, similar to her prior bleeding experience.  Denies chest pain, shortness of breath, fatigue, generalized weakness, fever/chills, recent illnesses.  She denies any other recent or acute medical complaints.  Not on any blood thinners, takes aspirin.  No NSAID use.  ED course -- BP 157/64, otherwise normal vitals. Labs --- Normal CBC including Hbg 13.4.  CMP normal except for glucose 130, Cr 1.02, GFR 54.    CTA abdomen/pelvis with area of hyperenhancement in the cecum, compatible with GI hemorrhage in that area.    Interventional radiology was consulted by EDP, reviewed the case and imaging in detail.  They currently do not recommend pursuing embolization due to patient being stable with normal hemoglobin, and the finding was felt to be subtle / borderline. Intervention felt more risk than benefit at this time.  Patient is admitted for close monitoring, trending Hbg. IR and GI are consulted.   Review of Systems: As mentioned in the history of present illness. All other systems reviewed and are negative.   Past Medical History:  Diagnosis Date   Carotid artery occlusion    Crohn's disease    Diabetes mellitus without complication    Hyperlipemia    Hypertension    Past Surgical History:  Procedure Laterality Date   COLONOSCOPY  WITH PROPOFOL N/A 01/05/2015   Procedure: COLONOSCOPY WITH PROPOFOL;  Surgeon: Christena Deem, MD;  Location: [facility] ENDOSCOPY;  Service: Endoscopy;  Laterality: N/A;   COLONOSCOPY WITH PROPOFOL N/A 11/02/2016   Procedure: COLONOSCOPY WITH PROPOFOL;  Surgeon: Wyline Mood, MD;  Location: [facility] ENDOSCOPY;  Service: Endoscopy;  Laterality: N/A;   IR ANGIOGRAM SELECTIVE EACH ADDITIONAL VESSEL  02/25/2022   IR ANGIOGRAM SELECTIVE EACH ADDITIONAL VESSEL  02/25/2022   IR ANGIOGRAM SELECTIVE EACH ADDITIONAL VESSEL  02/25/2022   IR ANGIOGRAM VISCERAL SELECTIVE  02/25/2022   IR EMBO ART  VEN HEMORR LYMPH EXTRAV  INC GUIDE ROADMAPPING  02/25/2022   IR US GUIDE VASC ACCESS RIGHT  02/25/2022   Social History:  reports that she has never smoked. She has never used smokeless tobacco. She reports that she does not drink alcohol and does not use drugs.  Allergies  Allergen Reactions   Advil [Ibuprofen] Rash    Family History  Problem Relation Age of Onset   Hypertension Father     Prior to Admission medications   Medication Sig Start Date End Date Taking? Authorizing Provider  amLODipine (NORVASC) 10 MG tablet Take 10 mg by mouth daily. 04/04/22   [provider]  aspirin EC 81 MG tablet Take 81 mg by mouth daily. Swallow whole.    [provider]  calcium-vitamin D (OSCAL 500/200 D-3) 500-200 MG-UNIT per tablet Take 1 tablet by mouth daily.    [provider]  furosemide (LASIX) 40 MG tablet Take 40 mg by mouth daily. 03/13/22  [provider]  lisinopril (ZESTRIL) 40 MG tablet Take 40 mg by mouth daily. 04/04/22   [provider]  lovastatin (MEVACOR) 40 MG tablet Take 40 mg by mouth at bedtime.    [provider]  metFORMIN (GLUCOPHAGE-XR) 500 MG 24 hr tablet Take 500 mg by mouth daily. 10/09/16   [provider]  metoprolol tartrate (LOPRESSOR) 50 MG tablet Take 1 tablet (50 mg total) by mouth 2 (two) times daily. 03/07/22   Ghimire, Werner Lean,  MD  [facility] ULTRA test strip 1 each by Other route. 03/13/22   [provider]    Physical Exam: Vitals:   09/13/22 1001 09/13/22 1030 09/13/22 1404 09/13/22 1415  BP: (!) 157/64 (!) 139/55  (!) 166/65  Pulse:  (!) 48  (!) 58  Resp:    17  Temp:   98.5 F (36.9 C) 98 F (36.7 C)  TempSrc:   Oral   SpO2:  98%  100%  Weight:      Height:       General exam: awake, alert, no acute distress HEENT: atraumatic, clear conjunctiva, anicteric sclera, moist mucus membranes, hearing grossly normal  Respiratory system: CTAB, no wheezes, rales or rhonchi, normal respiratory effort. Cardiovascular system: normal S1/S2, RRR, no JVD, murmurs, rubs, gallops, nonpitting lower extremity edema.   Gastrointestinal system: soft, NT, ND, no HSM felt, +bowel sounds. Central nervous system: A&O x4. no gross focal neurologic deficits, normal speech Extremities: moves all, nonpitting BLE edema, normal tone Skin: dry, intact, normal temperature Psychiatry: normal mood, congruent affect, judgement and insight appear normal  Data Reviewed:  Notable labs ---  CBC normal, initial Hbg 13.4.  CMP notable for glucose 130, Cr 1.02, GFR 54, otherwise normal.   CTA abdomen/pelvis wwo contrast -- noted focus of possible GI hemorrhage in the cecum.  This was reviewed by on call Interventional Radiologist, case discussed.  It is felt the finding was borderline / subtle, not a significant active bleed, and not amenable to endovascular intervention.   Assessment and Plan: * Lower GI bleeding History of diverticular bleeding s/p embolization (Oct '23) No abdominal pain.  Not on anticoagulation, only ASA. Suspect this is recurrent diverticular bleeding, pt has known pan-diverticulosis.  Also hx of Crohn's per chart, but pt not clear about this diagnosis, not on meds for it. --IR and GI are consulted --IR not planning embolization at this time, will follow --Hbg and BP are stable --Trend Hbg Q6H --Transfuse  if Hbg < 7 or if <8 and active bleeding --Repeat CTA or tagged RBC scan stat if significant active bleed --Clear liquid diet for now --Hold aspirin --SCD's for DVT prophylaxis --Follow up GI and IR recommendations  HTN (hypertension) BP on admission 157/64. PRN IV hydralazine for now and hold scheduled antihypertensives, allow BP room in case of significant bleeding.  Home regimen appears to be amlodipine 10 mg, Lasix 40 mg daily, lisinopril 40 mg daily, Lopressor 50 mg BID  Hyperlipidemia Hold statin for now      Advance Care Planning: Full Code - confirmed with patient  Consults:  Interventional Radiology (Dr. Juliette Alcide) Gastroenterology (Dr. Servando Snare)  Family Communication: None present. Pt stated she would call to update her son.  Severity of Illness: The appropriate patient status for this patient is INPATIENT. Inpatient status is judged to be reasonable and necessary in order to provide the required intensity of service to ensure the patient's safety. The patient's presenting symptoms, physical exam findings, and initial radiographic and laboratory data  in the context of their chronic comorbidities is felt to place them at high risk for further clinical deterioration. Furthermore, it is not anticipated that the patient will be medically stable for discharge from the hospital within 2 midnights of admission.   * I certify that at the point of admission it is my clinical judgment that the patient will require inpatient hospital care spanning beyond 2 midnights from the point of admission due to high intensity of service, high risk for further deterioration and high frequency of surveillance required.*  Author: Pennie Banter, DO 09/13/2022 4:26 PM  For on call review [email or web address].

## 2022-09-13 NOTE — ED Triage Notes (Signed)
Pt to ED for rectal bleeding started this morning. States was in hospital for same a couple months ago.

## 2022-09-13 NOTE — ED Notes (Signed)
Pt to bathroom, noted to have large amount of blood in toilet.

## 2022-09-13 NOTE — Consult Note (Signed)
Kaitlin Minium, MD [facility]  [address] [place], [place] [address] Phone: [phone] Fax : [phone]  Consultation  Referring Provider:     Dr. Denton Lank Primary Care Physician:  Leanna Sato, MD Primary Gastroenterologist: [facility]         Reason for Consultation:     GI bleeding  Date of Admission:  09/13/2022 Date of Consultation:  09/13/2022         HPI:   Kaitlin Gomez is a 88 y.o. female who has a history of Crohn disease and has been following with [facility] for her GI issues.  The patient had a lower GI bleed that was presumed diverticular back in October 2023 with interventional radiology embolizing the source of bleeding.  In 2018 the patient also had a GI bleed that was presumed diverticular with a colonoscopy by Dr. Tobi Bastos. The patient was now admitted with a GI bleed and underwent a CT angiography that showed:  IMPRESSION: 1. There is a focus of hyperenhancement in the cecum, most prominent on delayed venous phase, most compatible with etiology of lower gastrointestinal hemorrhage. 2. Pan colonic diverticulosis. 3. Nonobstructive left nephrolithiasis. 4.  Aortic Atherosclerosis  The patient has a history of a sigmoid colectomy with a colovesicular fistula repair.  The patient was evaluated this admission by interventional radiology who did not believe that the cecal source of bleeding was amenable to interventional radiology intervention.  The patient does also have a history of anal stenosis with difficulty passing a colonoscope back in 2018 by Dr. Tobi Bastos.  Past Medical History:  Diagnosis Date   Carotid artery occlusion    Crohn's disease    Diabetes mellitus without complication    Hyperlipemia    Hypertension     Past Surgical History:  Procedure Laterality Date   COLONOSCOPY WITH PROPOFOL N/A 01/05/2015   Procedure: COLONOSCOPY WITH PROPOFOL;  Surgeon: Christena Deem, MD;  Location: [facility] ENDOSCOPY;  Service: Endoscopy;   Laterality: N/A;   COLONOSCOPY WITH PROPOFOL N/A 11/02/2016   Procedure: COLONOSCOPY WITH PROPOFOL;  Surgeon: Wyline Mood, MD;  Location: [facility] ENDOSCOPY;  Service: Endoscopy;  Laterality: N/A;   IR ANGIOGRAM SELECTIVE EACH ADDITIONAL VESSEL  02/25/2022   IR ANGIOGRAM SELECTIVE EACH ADDITIONAL VESSEL  02/25/2022   IR ANGIOGRAM SELECTIVE EACH ADDITIONAL VESSEL  02/25/2022   IR ANGIOGRAM VISCERAL SELECTIVE  02/25/2022   IR EMBO ART  VEN HEMORR LYMPH EXTRAV  INC GUIDE ROADMAPPING  02/25/2022   IR US GUIDE VASC ACCESS RIGHT  02/25/2022    Prior to Admission medications   Medication Sig Start Date End Date Taking? Authorizing Provider  amLODipine (NORVASC) 10 MG tablet Take 10 mg by mouth daily. 04/04/22  Yes [provider]  aspirin EC 81 MG tablet Take 81 mg by mouth daily. Swallow whole.   Yes [provider]  calcium-vitamin D (OSCAL 500/200 D-3) 500-200 MG-UNIT per tablet Take 1 tablet by mouth daily.   Yes [provider]  furosemide (LASIX) 40 MG tablet Take 40 mg by mouth daily. 03/13/22  Yes [provider]  lisinopril (ZESTRIL) 40 MG tablet Take 40 mg by mouth daily. 04/04/22  Yes [provider]  lovastatin (MEVACOR) 40 MG tablet Take 40 mg by mouth at bedtime.   Yes [provider]  metFORMIN (GLUCOPHAGE-XR) 500 MG 24 hr tablet Take 500 mg by mouth daily. 10/09/16  Yes [provider]  metoprolol tartrate (LOPRESSOR) 50 MG tablet Take 1  tablet (50 mg total) by mouth 2 (two) times daily. 03/07/22  Yes Ghimire, Werner Lean, MD  [facility] ULTRA test strip 1 each by Other route. 03/13/22   [provider]    Family History  Problem Relation Age of Onset   Hypertension Father      Social History   Tobacco Use   Smoking status: Never   Smokeless tobacco: Never  Substance Use Topics   Alcohol use: No   Drug use: No    Allergies as of 09/13/2022 - Review Complete 09/13/2022  Allergen Reaction Noted   Advil [ibuprofen]  Rash 12/31/2014    Review of Systems:    All systems reviewed and negative except where noted in HPI.   Physical Exam:  Vital signs in last 24 hours: Temp:  [97.7 F (36.5 C)-98.5 F (36.9 C)] 97.7 F (36.5 C) (04/18 2009) Pulse Rate:  [48-67] 67 (04/18 2009) Resp:  [15-18] 18 (04/18 2009) BP: (121-166)/(52-68) 135/68 (04/18 2009) SpO2:  [98 %-100 %] 100 % (04/18 2009) Weight:  [90.7 kg] 90.7 kg (04/18 1000)   General:   Pleasant, cooperative in NAD Head:  Normocephalic and atraumatic. Eyes:   No icterus.   Conjunctiva pink. PERRLA. Ears:  Normal auditory acuity. Neck:  Supple; no masses or thyroidomegaly Lungs: Respirations even and unlabored. Lungs clear to auscultation bilaterally.   No wheezes, crackles, or rhonchi.  Heart:  Regular rate and rhythm;  Without murmur, clicks, rubs or gallops Abdomen:  Soft, nondistended, nontender. Normal bowel sounds. No appreciable masses or hepatomegaly.  No rebound or guarding.  Rectal:  Not performed. Msk:  Symmetrical without gross deformities.    Extremities:  Without edema, cyanosis or clubbing. Neurologic:  Alert and oriented x3;  grossly normal neurologically. Skin:  Intact without significant lesions or rashes. Cervical Nodes:  No significant cervical adenopathy. Psych:  Alert and cooperative. Normal affect.  LAB RESULTS: Recent Labs    09/13/22 1004 09/13/22 1604  WBC 6.0  --   HGB 13.4 11.4*  HCT 42.9 36.2  PLT 256  --    BMET Recent Labs    09/13/22 1004  NA 139  K 3.7  CL 107  CO2 22  GLUCOSE 130*  BUN 20  CREATININE 1.02*  CALCIUM 9.5   LFT Recent Labs    09/13/22 1004  PROT 7.6  ALBUMIN 4.0  AST 23  ALT 13  ALKPHOS 61  BILITOT 0.9  BILIDIR 0.1  IBILI 0.8   PT/INR Recent Labs    09/13/22 1929  LABPROT 15.5*  INR 1.2    STUDIES: CT Angio Abd/Pel W and/or Wo Contrast  Result Date: 09/13/2022 CLINICAL DATA:  88 year old female with lower gastrointestinal hemorrhage. EXAM: CTA ABDOMEN AND  PELVIS WITHOUT AND WITH CONTRAST TECHNIQUE: Multidetector CT imaging of the abdomen and pelvis was performed using the standard protocol during bolus administration of intravenous contrast. Multiplanar reconstructed images and MIPs were obtained and reviewed to evaluate the vascular anatomy. RADIATION DOSE REDUCTION: This exam was performed according to the departmental dose-optimization program which includes automated exposure control, adjustment of the mA and/or kV according to patient size and/or use of iterative reconstruction technique. CONTRAST:  OMNIPAQUE IOHEXOL 350 MG/ML SOLN COMPARISON:  02/25/2022 FINDINGS: VASCULAR Aorta: Normal caliber aorta without aneurysm, dissection, vasculitis or significant stenosis. Scattered atherosclerotic calcifications. Celiac: Patent without evidence of aneurysm, dissection, vasculitis or significant stenosis. SMA: Patent without evidence of aneurysm, dissection, vasculitis or significant stenosis. Renals: Dual bilateral renal arteries with moderate ostial stenoses  about the main bilateral renal artery secondary to atherosclerotic plaque. IMA: Diminutive. Inflow: Patent without evidence of aneurysm, dissection, vasculitis or significant stenosis. Proximal Outflow: Bilateral common femoral and visualized portions of the superficial and profunda femoral arteries are patent without evidence of aneurysm, dissection, vasculitis or significant stenosis. Veins: No obvious venous abnormality within the limitations of this arterial phase study. Review of the MIP images confirms the above findings. NON-VASCULAR Lower chest: No acute abnormality. Hepatobiliary: No focal liver abnormality is seen. No gallstones, gallbladder wall thickening, or biliary dilatation. Pancreas: Similar appearing fatty atrophy in the body and head. No pancreatic ductal dilatation or surrounding inflammatory changes. Spleen: Normal in size without focal abnormality. Adrenals/Urinary Tract: Adrenal  glands are unremarkable. Unchanged appearance of left inferior pole irregular shaped renal calculus measuring up to 6 mm. Kidneys are otherwise normal, without new renal calculi, focal lesion, or hydronephrosis. Bladder is unremarkable. Stomach/Bowel: Stomach is within normal limits. Appendix appears normal. About the mesenteric aspect of the cecum, between the ileal and appendiceal orifices is a focus of hyperenhancement most conspicuous on delayed venous phase (series 19, images 52 and 53, series 16, image 129), less conspicuous on arterial phase. This focus appears to arise from a distal arterial branch just inferior to the previously embolized distal right colic artery branch. Diffuse colonic diverticulosis. No evidence of bowel wall thickening, distention, or inflammatory changes. Lymphatic: No abdominopelvic lymphadenopathy. Reproductive: Status post hysterectomy. No adnexal masses. Other: No abdominal wall hernia or abnormality. No abdominopelvic ascites. Musculoskeletal: No acute osseous abnormality. Similar appearing multilevel degenerative changes of the visualized thoracolumbar spine. IMPRESSION: 1. There is a focus of hyperenhancement in the cecum, most prominent on delayed venous phase, most compatible with etiology of lower gastrointestinal hemorrhage. 2. Pan colonic diverticulosis. 3. Nonobstructive left nephrolithiasis. 4.  Aortic Atherosclerosis (ICD10-I70.0). Marliss Coots, MD [facility] Radiology Electronically Signed   By: Marliss Coots M.D.   On: 09/13/2022 13:39      Impression / Plan:   Assessment: Principal Problem:   Lower GI bleeding Active Problems:   HTN (hypertension)   Sinus bradycardia   Hyperlipidemia   Kaitlin Gomez is a 88 y.o. y/o female with with a lower GI bleed that appears to be from the cecal area as per CT angiography.  The patient was evaluated by interventional radiology who recommended a GI consult with  luminal investigation since they did not think it was amenable to interventional radiology treatment.  Plan:  The patient will be set up for a colonoscopy for tomorrow.  The patient will be prepped today.  The patient has been explained the plan and agrees with proceeding with the colonoscopy and take the prep tonight.  PPI IV twice daily  Continue serial CBCs and transfuse PRN Avoid NSAIDs Maintain 2 large-bore IV lines Please page GI with any acute hemodynamic changes, or signs of active GI bleeding   Thank you for involving me in the care of this patient.      LOS: 0 days   Kaitlin Minium, MD, [facility] 09/13/2022, 8:19 PM,  Pager [phone] 7am-5pm  Check AMION for 5pm -7am coverage and on weekends   Note: This dictation was prepared with Dragon dictation along with smaller phrase technology. Any transcriptional errors that result from this process are unintentional.

## 2022-09-13 NOTE — Assessment & Plan Note (Addendum)
Resume statin. 

## 2022-09-13 NOTE — Assessment & Plan Note (Addendum)
History of diverticular bleeding s/p embolization (Oct '23) No abdominal pain.  Not on anticoagulation, only ASA. Suspect this is recurrent diverticular bleeding, pt has known pan-diverticulosis.  Also hx of Crohn's per chart, but pt not clear about this diagnosis, not on meds for it. Hbg on admission 13.4 >> 10.3 this AM --IR and GI are consulted --GI plan for colonoscopy today, prepped overnight.   --NPO until after procedure, then diet per GI --Maintenance fluids until diet resumed --Trend Hbg Q6H --Transfuse if Hbg < 7 or if <8 and active bleeding --Repeat CTA or tagged RBC scan stat if significant active bleed --Hold aspirin --SCD's for DVT prophylaxis --Follow up GI and IR recommendations

## 2022-09-13 NOTE — ED Provider Notes (Signed)
[facility] Provider Note    Event Date/Time   First MD Initiated Contact with Patient 09/13/22 1019     (approximate)   History   Rectal Bleeding   HPI  Kaitlin Gomez is a 88 y.o. female with Crohn's disease status post sigmoid colectomy, colovesical fistula repair, diabetes, hypertension, hyperlipidemia who on review of records was admitted back in October 2023 for lower GI bleed likely related to diverticular who underwent IR coil embolization who comes in with concerns for recurrent bleeding.  I reviewed the records where initially her hemoglobin was 14.3 during this admission but the CTA showed active bleeding and so patient underwent IR embolization.  Patient reports that she had similar bleeding as she had back in October as she did today.  She reports it is a maroon color.  She denies any significant chest pain shortness of breath or any other concerns.  She reports that she is currently on a baby aspirin only.  She denies any other medical concerns at this time.   Physical Exam   Triage Vital Signs: ED Triage Vitals  Enc Vitals Group     BP 09/13/22 1001 (!) 157/64     Pulse Rate 09/13/22 1000 63     Resp 09/13/22 1000 18     Temp 09/13/22 1000 98.2 F (36.8 C)     Temp src --      SpO2 09/13/22 1000 100 %     Weight 09/13/22 1000 200 lb (90.7 kg)     Height 09/13/22 1000 5' 7.5" (1.715 m)     Head Circumference --      Peak Flow --      Pain Score 09/13/22 1000 0     Pain Loc --      Pain Edu? --      Excl. in GC? --     Most recent vital signs: Vitals:   09/13/22 1000 09/13/22 1001  BP:  (!) 157/64  Pulse: 63   Resp: 18   Temp: 98.2 F (36.8 C)   SpO2: 100%      General: Awake, no distress.  CV:  Good peripheral perfusion.  Resp:  Normal effort.  Abd:  No distention.  Soft nontender Other:     ED Results / Procedures / Treatments   Labs (all labs ordered are listed, but only abnormal results are displayed) Labs  Reviewed  CBC - Abnormal; Notable for the following components:      Result Value   RDW 16.4 (*)    All other components within normal limits  BASIC METABOLIC PANEL      RADIOLOGY I have reviewed the CTA personally and interpretted and no kdieny stone    PROCEDURES:  Critical Care performed: No  .1-3 Lead EKG Interpretation  Performed by: Concha Se, MD Authorized by: Concha Se, MD     Interpretation: normal     ECG rate:  60   ECG rate assessment: normal     Rhythm: sinus rhythm     Ectopy: none     Conduction: normal      MEDICATIONS ORDERED IN ED: Medications  0.9 %  sodium chloride infusion (has no administration in time range)  polyethylene glycol-electrolytes (NuLYTELY) solution 4,000 mL (has no administration in time range)  hydrALAZINE (APRESOLINE) injection 10 mg (has no administration in time range)  iohexol (OMNIPAQUE) 350 MG/ML injection 100 mL (100 mLs Intravenous Contrast Given 09/13/22 1139)     IMPRESSION /  MDM / ASSESSMENT AND PLAN / ED COURSE  I reviewed the triage vital signs and the nursing notes.   Patient's presentation is most consistent with acute presentation with potential threat to life or bodily function.   This concerning for lower GI bleed given patient's history.  Patient hemodynamically stable.  Labs ordered evaluate for Electra abdomens, AKI, anemia.  Will get CTA.  Patient noted to have active blood that is obviously bloody while stooling here  11:12 AM patient noted to have diffuse bloody bowel movement we will proceed with CTA.   2:11 PM patient has active bleeding noted on CTA.  I will discuss with IR for potential embolization.  Discussed the hospital team for admission   The patient is on the cardiac monitor to evaluate for evidence of arrhythmia and/or significant heart rate changes.      FINAL CLINICAL IMPRESSION(S) / ED DIAGNOSES   Final diagnoses:  Rectal bleeding     Rx / DC Orders   ED Discharge  Orders     None        Note:  This document was prepared using Dragon voice recognition software and may include unintentional dictation errors.   Concha Se, MD 09/13/22 [phone]

## 2022-09-14 ENCOUNTER — Encounter
Admission: EM | Disposition: A | Discharge status: Home / Self Care | Payer: Self-pay | Source: Home / Self Care | Attending: Internal Medicine

## 2022-09-14 ENCOUNTER — Inpatient Hospital Stay: Payer: Medicare HMO | Admitting: Certified Registered"

## 2022-09-14 DIAGNOSIS — K922 Gastrointestinal hemorrhage, unspecified: Secondary | ICD-10-CM | POA: Diagnosis not present

## 2022-09-14 HISTORY — PX: COLONOSCOPY WITH PROPOFOL: SHX5780

## 2022-09-14 LAB — BASIC METABOLIC PANEL
Anion gap: 12 (ref 5–15)
BUN: 18 mg/dL (ref 8–23)
CO2: 22 mmol/L (ref 22–32)
Calcium: 9.1 mg/dL (ref 8.9–10.3)
Chloride: 106 mmol/L (ref 98–111)
Creatinine, Ser: 0.89 mg/dL (ref 0.44–1.00)
GFR, Estimated: >60 mL/min (ref >60)
Glucose, Bld: 139 mg/dL — ABNORMAL HIGH (ref 70–99)
Potassium: 3.4 mmol/L — ABNORMAL LOW (ref 3.5–5.1)
Sodium: 140 mmol/L (ref 135–145)

## 2022-09-14 LAB — CBC
HCT: 31.7 % — ABNORMAL LOW (ref 36.0–46.0)
Hemoglobin: 10.3 g/dL — ABNORMAL LOW (ref 12.0–15.0)
MCH: 27.6 pg (ref 26.0–34.0)
MCHC: 32.5 g/dL (ref 30.0–36.0)
MCV: 85 fL (ref 80.0–100.0)
Platelets: 244 10*3/uL (ref 150–400)
RBC: 3.73 MIL/uL — ABNORMAL LOW (ref 3.87–5.11)
RDW: 16.2 % — ABNORMAL HIGH (ref 11.5–15.5)
WBC: 9.6 10*3/uL (ref 4.0–10.5)
nRBC: 0 % (ref 0.0–0.2)

## 2022-09-14 LAB — HEMOGLOBIN AND HEMATOCRIT, BLOOD
HCT: 27.7 % — ABNORMAL LOW (ref 36.0–46.0)
Hemoglobin: 8.9 g/dL — ABNORMAL LOW (ref 12.0–15.0)

## 2022-09-14 LAB — MAGNESIUM: Magnesium: 1.7 mg/dL (ref 1.7–2.4)

## 2022-09-14 SURGERY — COLONOSCOPY WITH PROPOFOL
Anesthesia: General

## 2022-09-14 MED ORDER — PROPOFOL 500 MG/50ML IV EMUL
INTRAVENOUS | Status: DC | PRN
  Administered 2022-09-14: 150 ug/kg/min via INTRAVENOUS

## 2022-09-14 MED ORDER — LIDOCAINE HCL (CARDIAC) PF 100 MG/5ML IV SOSY
PREFILLED_SYRINGE | INTRAVENOUS | Status: DC | PRN
  Administered 2022-09-14: 50 mg via INTRAVENOUS

## 2022-09-14 MED ORDER — ONDANSETRON HCL 4 MG/2ML IJ SOLN
4.0000 mg | Freq: Four times a day (QID) | INTRAMUSCULAR | Status: DC | PRN
  Administered 2022-09-14: 4 mg via INTRAVENOUS
  Filled 2022-09-14: qty 2

## 2022-09-14 MED ORDER — AMLODIPINE BESYLATE 10 MG PO TABS
10.0000 mg | ORAL_TABLET | Freq: Every day | ORAL | Status: DC
  Administered 2022-09-14 – 2022-09-16 (×3): 10 mg via ORAL
  Filled 2022-09-14 (×3): qty 1

## 2022-09-14 MED ORDER — LACTATED RINGERS IV SOLN: INTRAVENOUS | Status: DC

## 2022-09-14 MED ORDER — PANTOPRAZOLE SODIUM 40 MG IV SOLR
40.0000 mg | Freq: Two times a day (BID) | INTRAVENOUS | Status: DC
  Administered 2022-09-14 – 2022-09-16 (×5): 40 mg via INTRAVENOUS
  Filled 2022-09-14 (×5): qty 10

## 2022-09-14 MED ORDER — POTASSIUM CHLORIDE 10 MEQ/100ML IV SOLN
10.0000 meq | INTRAVENOUS | Status: AC
  Administered 2022-09-14 (×3): 10 meq via INTRAVENOUS
  Filled 2022-09-14 (×3): qty 100

## 2022-09-14 MED ORDER — PROPOFOL 10 MG/ML IV BOLUS
INTRAVENOUS | Status: DC | PRN
  Administered 2022-09-14 (×2): 30 mg via INTRAVENOUS

## 2022-09-14 NOTE — Progress Notes (Addendum)
Progress Note   Patient: Kaitlin Gomez:[id number] DOB: Jan 07, 1935 DOA: 09/13/2022     1 DOS: the patient was seen and examined on 09/14/2022   Brief hospital course: HPI on admission 4/18: "Kaitlin Gomez is a 88 y.o. female with medical history significant of diverticulosis with lower GI bleeding in October 2023 s/p embolization procedure, Crohn's disease, HTN, bradycardia who presented to the ED this AM for evaluation of rectal bleeding, maroon in color.  Pt reports onset around 9:00 this AM.  Denies associated abdominal pain.  Does endorse nausea with no vomiting, similar to her prior bleeding experience.  Denies chest pain, shortness of breath, fatigue, generalized weakness, fever/chills, recent illnesses.  She denies any other recent or acute medical complaints.  Not on any blood thinners, takes aspirin.  No NSAID use.   ED course -- BP 157/64, otherwise normal vitals. Labs --- Normal CBC including Hbg 13.4.  CMP normal except for glucose 130, Cr 1.02, GFR 54.     CTA abdomen/pelvis with area of hyperenhancement in the cecum, compatible with GI hemorrhage in that area.     Interventional radiology was consulted by EDP, reviewed the case and imaging in detail.  They currently do not recommend pursuing embolization due to patient being stable with normal hemoglobin, and the finding was felt to be subtle / borderline. Intervention felt more risk than benefit at this time.   Patient is admitted for close monitoring, trending Hbg. IR and GI are consulted."  4/19 -- for colonoscopy today, did prep overnight.  Some bleeding overnight, none this AM.  Hbg 10.3 with some dilutional component with IV fluids.  Assessment and Plan: * Lower GI bleeding History of diverticular bleeding s/p embolization (Oct '23) No abdominal pain.  Not on anticoagulation, only ASA. Suspect this is recurrent diverticular bleeding, pt has known pan-diverticulosis.  Also hx of Crohn's per chart, but pt not clear  about this diagnosis, not on meds for it. Hbg on admission 13.4 >> 10.3 this AM --IR and GI are consulted --GI plan for colonoscopy today, prepped overnight.   --NPO until after procedure, then diet per GI --Maintenance fluids until diet resumed --Trend Hbg Q6H --Transfuse if Hbg < 7 or if <8 and active bleeding --Repeat CTA or tagged RBC scan stat if significant active bleed --Hold aspirin --SCD's for DVT prophylaxis --Follow up GI and IR recommendations  Hypokalemia K 3.4 this AM, IV K riders x 3 ordered --Monitor BMP, replace K PRN  HTN (hypertension) BP on admission 157/64, have since been mildly elevated.   --PRN IV hydralazine  --Resume amlodipine today. --Continue holding other antihypertensives, allow BP room in case of significant bleeding.  (Lasix 40 mg daily, lisinopril 40 mg daily, Lopressor 50 mg BID)  Hyperlipidemia Hold statin for now        Subjective: Pt awake resting in bed when seen this AM.  Reports being exhausted has not slept since around 4 am yesterday.  Reports some bleeding overnight but no more this AM.  No other acute complaints at this time.    Physical Exam: Vitals:   09/13/22 2009 09/14/22 0335 09/14/22 0900 09/14/22 1328  BP: 135/68 (!) 157/88 (!) 169/71 (!) 150/73  Pulse: 67  95 (!) 112  Resp: Temp: 97.7 F (36.5 C) 98.2 F (36.8 C) 98.6 F (37 C) (!) 97.1 F (36.2 C)  TempSrc:    Temporal  SpO2: 100% 91% 95% 100%  Weight:  Height:       General exam: awake, alert, no acute distress HEENT: moist mucus membranes, hearing grossly normal  Respiratory system: CTAB no wheezes, rales or rhonchi, normal respiratory effort. Cardiovascular system: normal S1/S2, RRR, no pedal edema.   Gastrointestinal system: soft, NT, ND, no HSM felt, +bowel sounds. Central nervous system: A&O x4. no gross focal neurologic deficits, normal speech Extremities: moves all , no edema, normal tone Skin: dry, intact, normal  temperature Psychiatry: normal mood, congruent affect, judgement and insight appear normal   Data Reviewed:  Notable labs ----  Hbg trend: 13.4 >> 11.4 >> 10.5 >> 10.3  Family Communication: None present, pt reports she will update  Disposition: Status is: Inpatient Remains inpatient appropriate because: ongoing evaluation of GI bleeding   Planned Discharge Destination: Home    Time spent: 38 minutes  Author: Pennie Banter, DO 09/14/2022 1:44 PM  For on call review [email or web address].

## 2022-09-14 NOTE — Op Note (Signed)
[facility] Gastroenterology Patient Name: Kaitlin Gomez Procedure Date: 09/14/2022 1:40 PM MRN: [id number] Account #: [id number] Date of Birth: 01-20-1935 Admit Type: Inpatient Age: 88 Room: [facility] ENDO ROOM 2 Gender: Female Note Status: Finalized Instrument Name: Nelda Marseille [id number] Procedure:             Colonoscopy Indications:           Hematochezia Providers:             [name] MD, MD Referring MD:          Jacklynn Ganong (Referring MD) Medicines:             Propofol per Anesthesia Complications:         No immediate complications. Procedure:             Pre-Anesthesia Assessment:                        - Prior to the procedure, a History and Physical was                         performed, and patient medications and allergies were                         reviewed. The patient's tolerance of previous                         anesthesia was also reviewed. The risks and benefits                         of the procedure and the sedation options and risks                         were discussed with the patient. All questions were                         answered, and informed consent was obtained. Prior                         Anticoagulants: The patient has taken no anticoagulant                         or antiplatelet agents. ASA Grade Assessment: II - A                         patient with mild systemic disease. After reviewing                         the risks and benefits, the patient was deemed in                         satisfactory condition to undergo the procedure.                        After obtaining informed consent, the colonoscope was                         passed under direct vision. Throughout the procedure,  the patient's blood pressure, pulse, and oxygen                         saturations were monitored continuously. The                         Colonoscope was introduced through the anus and                          advanced to the the cecum, identified by appendiceal                         orifice and ileocecal valve. The colonoscopy was                         performed without difficulty. The patient tolerated                         the procedure well. The quality of the bowel                         preparation was good. Findings:      The perianal and digital rectal examinations were normal.      There was evidence of a prior end-to-end colo-colonic anastomosis in the       sigmoid colon. This was patent.      Multiple small-mouthed diverticula were found in the entire colon.      Localized mild inflammation characterized by erythema was found in the       descending colon. Impression:            - Patent end-to-end colo-colonic anastomosis.                        - Diverticulosis in the entire examined colon.                        - Localized mild inflammation was found in the                         descending colon secondary to colitis.                        - No specimens collected. Recommendation:        - Discharge patient to home.                        - Resume previous diet.                        - Continue present medications.                        - No active bleeding.                        Only diverticulosis seen and likely the cause of                         bleeding Procedure Code(s):     --- Professional ---  84696, Colonoscopy, flexible; diagnostic, including                         collection of specimen(s) by brushing or washing, when                         performed (separate procedure) Diagnosis Code(s):     --- Professional ---                        K92.1, Melena (includes Hematochezia) CPT copyright 2022 American Medical Association. All rights reserved. The codes documented in this report are preliminary and upon coder review may  be revised to meet current compliance requirements. [name] MD, MD 09/14/2022 2:01:45 PM This report  has been signed electronically. Number of Addenda: 0 Note Initiated On: 09/14/2022 1:40 PM Scope Withdrawal Time: 0 hours 8 minutes 7 seconds  Total Procedure Duration: 0 hours 9 minutes 17 seconds  Estimated Blood Loss:  Estimated blood loss: none.      [facility]

## 2022-09-14 NOTE — Transfer of Care (Signed)
Immediate Anesthesia Transfer of Care Note  Patient: Kaitlin Gomez  Procedure(s) Performed: COLONOSCOPY WITH PROPOFOL  Patient Location: Endoscopy Unit  Anesthesia Type:General  Level of Consciousness: sedated and patient cooperative  Airway & Oxygen Therapy: Patient Spontanous Breathing  Post-op Assessment: Report given to RN and Post -op Vital signs reviewed and stable  Post vital signs: Reviewed and stable  Last Vitals:  Vitals Value Taken Time  BP 128/51 09/14/22 1403  Temp    Pulse 111 09/14/22 1404  Resp 30 09/14/22 1404  SpO2 98 % 09/14/22 1404  Vitals shown include unvalidated device data.  Last Pain:  Vitals:   09/14/22 1328  TempSrc: Temporal  PainSc: 0-No pain         Complications: No notable events documented.

## 2022-09-14 NOTE — Progress Notes (Signed)
  Transition of Care [facility]) Screening Note   Patient Details  Name: Kaitlin Gomez Date of Birth: August 31, 1934   Transition [facility] Care [facility]) CM/SW Contact:    Chapman Fitch, RN Phone Number: 09/14/2022, 11:27 AM    Transition of Care Department [facility]) has reviewed patient and no TOC needs have been identified at this time. We will continue to monitor patient advancement through interdisciplinary progression rounds. If new patient transition needs arise, please place a TOC consult.

## 2022-09-14 NOTE — Anesthesia Procedure Notes (Addendum)
Date/Time: 09/14/2022 1:41 PM  Performed by: Ginger Carne, CRNAPre-anesthesia Checklist: Patient identified, Emergency Drugs available, Suction available, Patient being monitored and Timeout performed Patient Re-evaluated:Patient Re-evaluated prior to induction Oxygen Delivery Method: Nasal cannula Preoxygenation: Pre-oxygenation with 100% oxygen Induction Type: IV induction

## 2022-09-14 NOTE — Hospital Course (Signed)
HPI on admission 4/18: "Kaitlin Gomez is a 88 y.o. female with medical history significant of diverticulosis with lower GI bleeding in October 2023 s/p embolization procedure, Crohn's disease, HTN, bradycardia who presented to the ED this AM for evaluation of rectal bleeding, maroon in color.  Pt reports onset around 9:00 this AM.  Denies associated abdominal pain.  Does endorse nausea with no vomiting, similar to her prior bleeding experience.  Denies chest pain, shortness of breath, fatigue, generalized weakness, fever/chills, recent illnesses.  She denies any other recent or acute medical complaints.  Not on any blood thinners, takes aspirin.  No NSAID use.   ED course -- BP 157/64, otherwise normal vitals. Labs --- Normal CBC including Hbg 13.4.  CMP normal except for glucose 130, Cr 1.02, GFR 54.     CTA abdomen/pelvis with area of hyperenhancement in the cecum, compatible with GI hemorrhage in that area.     Interventional radiology was consulted by EDP, reviewed the case and imaging in detail.  They currently do not recommend pursuing embolization due to patient being stable with normal hemoglobin, and the finding was felt to be subtle / borderline. Intervention felt more risk than benefit at this time.   Patient is admitted for close monitoring, trending Hbg. IR and GI are consulted."  4/19 -- for colonoscopy today, did prep overnight.  Some bleeding overnight, none this AM.  Hbg 10.3 with some dilutional component with IV fluids.  4/20 -- tolerating diet.  Hbg declining, this AM 8.6, 8.4

## 2022-09-14 NOTE — Anesthesia Preprocedure Evaluation (Addendum)
Anesthesia Evaluation  Patient identified by MRN, date of birth, ID band Patient awake    Reviewed: Allergy & Precautions, NPO status , Patient's Chart, lab work & pertinent test results  History of Anesthesia Complications Negative for: history of anesthetic complications  Airway Mallampati: II   Neck ROM: Full    Dental no notable dental hx.    Pulmonary neg pulmonary ROS   Pulmonary exam normal breath sounds clear to auscultation       Cardiovascular hypertension, + Peripheral Vascular Disease (carotid disease)  Normal cardiovascular exam Rhythm:Regular Rate:Normal  ECG 05/04/22: normal sinus rhythm heart 73, first-degree block, no acute ischemic changes  Echo 02/26/22:   1. Left ventricular ejection fraction, by estimation, is 60 to 65%. The left ventricle has normal function. The left ventricle has no regional wall motion abnormalities. There is mild left ventricular hypertrophy. Left ventricular diastolic parameters are consistent with Grade I diastolic dysfunction (impaired relaxation).   2. Right ventricular systolic function is normal. The right ventricular size is normal. There is normal pulmonary artery systolic pressure. The estimated right ventricular systolic pressure is 26.2 mmHg.   3. The mitral valve is degenerative. No evidence of mitral valve regurgitation. Moderate mitral annular calcification.   4. The aortic valve was not well visualized. Aortic valve regurgitation is not visualized. No aortic stenosis is present.    Neuro/Psych CVA (found incidentally on imaging; no symptoms)    GI/Hepatic Crohn disease   Endo/Other  diabetes, Type 2  Obesity   Renal/GU negative Renal ROS     Musculoskeletal   Abdominal   Peds  Hematology negative hematology ROS (+)   Anesthesia Other Findings Cardiology note 05/04/22:  1. Cerebrovascular accident (CVA), unspecified mechanism (HCC) 2. Stenosis of left carotid  artery 3. Mixed hyperlipidemia -Admitted to the hospital October for diverticular bleed.  Found to have subacute infarcts.  Found to have severe stenosis of the left internal carotid artery.  Plan was for outpatient revascularization.  Seems she has not seen vascular surgery.  We will refer her back to vascular surgery.  She will continue aspirin 81 mg daily.  I suspect her stroke is related to high-grade carotid artery disease.  Her echo was normal.  I see no need for a monitor at this time.  Her cholesterol level is well-controlled.  She will keep a close eye on her blood pressure.  Her diabetes is controlled as well.  She will see [place] back as needed.  Given the high-grade carotid artery disease I do not see a need for a monitor at this time.   Disposition: Return if symptoms worsen or fail to improve.   Reproductive/Obstetrics                             Anesthesia Physical Anesthesia Plan  ASA: 3  Anesthesia Plan: General   Post-op Pain Management:    Induction: Intravenous  PONV Risk Score and Plan: 3 and Propofol infusion, TIVA and Treatment may vary due to age or medical condition  Airway Management Planned: Natural Airway  Additional Equipment:   Intra-op Plan:   Post-operative Plan:   Informed Consent: I have reviewed the patients History and Physical, chart, labs and discussed the procedure including the risks, benefits and alternatives for the proposed anesthesia with the patient or authorized representative who has indicated his/her understanding and acceptance.       Plan Discussed with: CRNA  Anesthesia Plan Comments: (LMA/GETA backup  discussed.  Patient consented for risks of anesthesia including but not limited to:  - adverse reactions to medications - damage to eyes, teeth, lips or other oral mucosa - nerve damage due to positioning  - sore throat or hoarseness - damage to heart, brain, nerves, lungs, other parts of body or loss of  life  Informed patient about role of CRNA in peri- and intra-operative care.  Patient voiced understanding.)        Anesthesia Quick Evaluation

## 2022-09-14 NOTE — Assessment & Plan Note (Addendum)
K 3.4 this AM, IV K riders x 3 ordered --Monitor BMP, replace K PRN

## 2022-09-14 NOTE — Progress Notes (Signed)
Multiple attempts to obtain CBC lab draw without success, provider notified. Patient had no bleeding episode today, vs stable.

## 2022-09-15 DIAGNOSIS — K922 Gastrointestinal hemorrhage, unspecified: Secondary | ICD-10-CM | POA: Diagnosis not present

## 2022-09-15 LAB — CBC
HCT: 26.9 % — ABNORMAL LOW (ref 36.0–46.0)
Hemoglobin: 8.6 g/dL — ABNORMAL LOW (ref 12.0–15.0)
MCH: 27.7 pg (ref 26.0–34.0)
MCHC: 32 g/dL (ref 30.0–36.0)
MCV: 86.5 fL (ref 80.0–100.0)
Platelets: 218 10*3/uL (ref 150–400)
RBC: 3.11 MIL/uL — ABNORMAL LOW (ref 3.87–5.11)
RDW: 16.7 % — ABNORMAL HIGH (ref 11.5–15.5)
WBC: 10.9 10*3/uL — ABNORMAL HIGH (ref 4.0–10.5)
nRBC: 0 % (ref 0.0–0.2)

## 2022-09-15 LAB — HEMOGLOBIN AND HEMATOCRIT, BLOOD
HCT: 26.2 % — ABNORMAL LOW (ref 36.0–46.0)
HCT: 26.8 % — ABNORMAL LOW (ref 36.0–46.0)
Hemoglobin: 8.4 g/dL — ABNORMAL LOW (ref 12.0–15.0)
Hemoglobin: 8.6 g/dL — ABNORMAL LOW (ref 12.0–15.0)

## 2022-09-15 LAB — BASIC METABOLIC PANEL
Anion gap: 9 (ref 5–15)
BUN: 10 mg/dL (ref 8–23)
CO2: 22 mmol/L (ref 22–32)
Calcium: 8.8 mg/dL — ABNORMAL LOW (ref 8.9–10.3)
Chloride: 110 mmol/L (ref 98–111)
Creatinine, Ser: 0.87 mg/dL (ref 0.44–1.00)
GFR, Estimated: >60 mL/min (ref >60)
Glucose, Bld: 119 mg/dL — ABNORMAL HIGH (ref 70–99)
Potassium: 3.2 mmol/L — ABNORMAL LOW (ref 3.5–5.1)
Sodium: 141 mmol/L (ref 135–145)

## 2022-09-15 LAB — MAGNESIUM: Magnesium: 1.7 mg/dL (ref 1.7–2.4)

## 2022-09-15 MED ORDER — TRAZODONE HCL 50 MG PO TABS
25.0000 mg | ORAL_TABLET | Freq: Every evening | ORAL | Status: DC | PRN
  Administered 2022-09-15: 25 mg via ORAL
  Filled 2022-09-15: qty 1

## 2022-09-15 MED ORDER — MELATONIN 5 MG PO TABS
5.0000 mg | ORAL_TABLET | Freq: Every day | ORAL | Status: DC
  Administered 2022-09-15: 5 mg via ORAL
  Filled 2022-09-15: qty 1

## 2022-09-15 MED ORDER — METOPROLOL TARTRATE 50 MG PO TABS
50.0000 mg | ORAL_TABLET | Freq: Two times a day (BID) | ORAL | Status: DC
  Administered 2022-09-15 – 2022-09-16 (×3): 50 mg via ORAL
  Filled 2022-09-15 (×3): qty 1

## 2022-09-15 MED ORDER — POTASSIUM CHLORIDE CRYS ER 20 MEQ PO TBCR
40.0000 meq | EXTENDED_RELEASE_TABLET | ORAL | Status: AC
  Administered 2022-09-15 (×2): 40 meq via ORAL
  Filled 2022-09-15 (×2): qty 2

## 2022-09-15 MED ORDER — PRAVASTATIN SODIUM 20 MG PO TABS
20.0000 mg | ORAL_TABLET | Freq: Every day | ORAL | Status: DC
  Administered 2022-09-15: 20 mg via ORAL
  Filled 2022-09-15: qty 1

## 2022-09-15 NOTE — Assessment & Plan Note (Signed)
Sinus tachycardia - not POA, due to holding her metoprolol which has been resumed (4/20). Bradycardia likely due to metoprolol.  Asymptomatic.

## 2022-09-15 NOTE — Progress Notes (Signed)
Progress Note   Patient: Kaitlin Gomez TKZ:[id number] DOB: Jun 15, 1934 DOA: 09/13/2022     2 DOS: the patient was seen and examined on 09/15/2022   Brief hospital course: HPI on admission 4/18: "ZOIEE WIMMER is a 88 y.o. female with medical history significant of diverticulosis with lower GI bleeding in October 2023 s/p embolization procedure, Crohn's disease, HTN, bradycardia who presented to the ED this AM for evaluation of rectal bleeding, maroon in color.  Pt reports onset around 9:00 this AM.  Denies associated abdominal pain.  Does endorse nausea with no vomiting, similar to her prior bleeding experience.  Denies chest pain, shortness of breath, fatigue, generalized weakness, fever/chills, recent illnesses.  She denies any other recent or acute medical complaints.  Not on any blood thinners, takes aspirin.  No NSAID use.   ED course -- BP 157/64, otherwise normal vitals. Labs --- Normal CBC including Hbg 13.4.  CMP normal except for glucose 130, Cr 1.02, GFR 54.     CTA abdomen/pelvis with area of hyperenhancement in the cecum, compatible with GI hemorrhage in that area.     Interventional radiology was consulted by EDP, reviewed the case and imaging in detail.  They currently do not recommend pursuing embolization due to patient being stable with normal hemoglobin, and the finding was felt to be subtle / borderline. Intervention felt more risk than benefit at this time.   Patient is admitted for close monitoring, trending Hbg. IR and GI are consulted."  4/19 -- for colonoscopy today, did prep overnight.  Some bleeding overnight, none this AM.  Hbg 10.3 with some dilutional component with IV fluids.  4/20 -- tolerating diet.  Hbg declining, this AM 8.6, 8.4    Assessment and Plan: * Lower GI bleeding History of diverticular bleeding s/p embolization (Oct '23) No abdominal pain.  Not on anticoagulation, only ASA. Suspect this is recurrent diverticular bleeding, pt has known  pan-diverticulosis.  Also hx of Crohn's per chart, but pt not clear about this diagnosis, not on meds for it. Hbg on admission 13.4 >> 10.3 this AM --IR and GI are consulted --GI plan for colonoscopy today, prepped overnight.   --Diet resumed, tolerating --Stop IV fluids --Trend Hbg Q6H --Transfuse if Hbg < 7 or if <8 and active bleeding --Repeat CTA or tagged RBC scan stat if significant active bleed --Hold aspirin --SCD's for DVT prophylaxis --Follow up GI recommendations  Sinus bradycardia Sinus tachycardia - not POA, due to holding her metoprolol which has been resumed (4/20). Bradycardia likely due to metoprolol.  Asymptomatic.  Hypokalemia K 3.4 this AM, IV K riders x 3 ordered --Monitor BMP, replace K PRN  HTN (hypertension) BP on admission 157/64, have since been mildly elevated.   --PRN IV hydralazine  --Continue amlodipine  --Resume home metoprolol 50 mg BID --Continue holding other antihypertensives, allow BP room in case of significant bleeding.  (Lasix 40 mg daily, lisinopril 40 mg daily)  Hyperlipidemia Resume statin        Subjective: Pt awake resting in bed when seen this AM.  No bleeding since doing bowel prep for scope.  Tolerating meals well.  Denies abdominal pain or other complaints.  States she's dealt with bleeding episodes on and off for much of her life, but this one was the most frightening bc it was so much blood.     Physical Exam: Vitals:   09/14/22 2029 09/15/22 0400 09/15/22 0602 09/15/22 0804  BP: (!) 156/48 (!) 179/73 (!) 158/60 (!) 157/52  Pulse: (!) 110 (!) 110  (!) 110  Resp: Temp: 98.6 F (37 C) 98.4 F (36.9 C)  98.2 F (36.8 C)  TempSrc: Oral Oral    SpO2: 100% 100%  100%  Weight:      Height:       General exam: awake, alert, no acute distress HEENT: moist mucus membranes, hearing grossly normal  Respiratory system: on room air, normal respiratory effort. Cardiovascular system: RRR, no pedal edema, intact  pedal pulses.   Gastrointestinal system: soft, NT, ND. Central nervous system: A&O x4. no gross focal neurologic deficits, normal speech Extremities: moves all , no edema, normal tone Skin: dry, intact, normal temperature Psychiatry: normal mood, congruent affect, judgement and insight appear normal   Data Reviewed:  Notable labs ----   K 3.2, glucose 119, Ca 8.8, WBC 10.9 from 9.6 Hbg trend: 13.4 >> 11.4 >> 10.5 >> 10.3 >> 8.9 >> 8.6 >> 8.4   Colonoscopy 4/19 -- diverticulosis of entire colon, patent end-to-end colo-colonic anastamosis, mild localized inflammation in descending colon (colitis, known hx of Crohns which is clinically stable).  No active bleeding was seen.   Family Communication: None present, pt reports she will update  Disposition: Status is: Inpatient Remains inpatient appropriate because: ongoing evaluation of GI bleeding   Planned Discharge Destination: Home    Time spent: 36 minutes  Author: Pennie Banter, DO 09/15/2022 12:24 PM  For on call review [email or web address].

## 2022-09-15 NOTE — Progress Notes (Signed)
CCMD reported that the patient is having frequent bigeminy. MD notified. Order received for EKG and to renew telemetry

## 2022-09-16 DIAGNOSIS — K922 Gastrointestinal hemorrhage, unspecified: Secondary | ICD-10-CM | POA: Diagnosis not present

## 2022-09-16 LAB — CBC
HCT: 26.5 % — ABNORMAL LOW (ref 36.0–46.0)
Hemoglobin: 8.3 g/dL — ABNORMAL LOW (ref 12.0–15.0)
MCH: 27.8 pg (ref 26.0–34.0)
MCHC: 31.3 g/dL (ref 30.0–36.0)
MCV: 88.6 fL (ref 80.0–100.0)
Platelets: 210 10*3/uL (ref 150–400)
RBC: 2.99 MIL/uL — ABNORMAL LOW (ref 3.87–5.11)
RDW: 16.9 % — ABNORMAL HIGH (ref 11.5–15.5)
WBC: 10.1 10*3/uL (ref 4.0–10.5)
nRBC: 0 % (ref 0.0–0.2)

## 2022-09-16 LAB — BASIC METABOLIC PANEL
Anion gap: 7 (ref 5–15)
BUN: 14 mg/dL (ref 8–23)
CO2: 23 mmol/L (ref 22–32)
Calcium: 9.1 mg/dL (ref 8.9–10.3)
Chloride: 110 mmol/L (ref 98–111)
Creatinine, Ser: 0.9 mg/dL (ref 0.44–1.00)
GFR, Estimated: >60 mL/min (ref >60)
Glucose, Bld: 125 mg/dL — ABNORMAL HIGH (ref 70–99)
Potassium: 4.2 mmol/L (ref 3.5–5.1)
Sodium: 140 mmol/L (ref 135–145)

## 2022-09-16 LAB — HEMOGLOBIN AND HEMATOCRIT, BLOOD
HCT: 26.5 % — ABNORMAL LOW (ref 36.0–46.0)
Hemoglobin: 8.4 g/dL — ABNORMAL LOW (ref 12.0–15.0)

## 2022-09-16 MED ORDER — LISINOPRIL 20 MG PO TABS
40.0000 mg | ORAL_TABLET | Freq: Every day | ORAL | Status: DC
  Administered 2022-09-16: 40 mg via ORAL
  Filled 2022-09-16: qty 2

## 2022-09-16 MED ORDER — FUROSEMIDE 40 MG PO TABS
40.0000 mg | ORAL_TABLET | Freq: Every day | ORAL | Status: DC
  Administered 2022-09-16: 40 mg via ORAL
  Filled 2022-09-16: qty 1

## 2022-09-16 NOTE — TOC Transition Note (Signed)
Transition of Care [facility]) - CM/SW Discharge Note   Patient Details  Name: Kaitlin Gomez MRN: [id number] Date of Birth: 18-Mar-1935  Transition of Care [facility]) CM/SW Contact:  Bing Quarry, RN Phone Number: 09/16/2022, 10:08 AM   Clinical Narrative:  4/21: Potential discharge today pending 1 pm lab work. Patient declined HH PT on discharge to home. No DME recommended or ordered. Gabriel Cirri RN CM [phone]    Final next level of care: Home w Home Health Services (Declined [facility] PT) Barriers to Discharge: Other (must enter comment) (Pending lab work at 1 pm.)   Patient Goals and CMS Choice   Choice offered to / list presented to : Patient  Discharge Placement                         Discharge Plan and Services Additional resources added to the After Visit Summary for                  DME Arranged: N/A DME Agency: NA       HH Arranged: Refused HH HH Agency: NA        Social Determinants of Health (SDOH) Interventions SDOH Screenings   Food Insecurity: No Food Insecurity (09/13/2022)  Housing: Low Risk  (09/13/2022)  Transportation Needs: No Transportation Needs (09/13/2022)  Utilities: Not At Risk (09/13/2022)  Tobacco Use: Low Risk  (09/13/2022)     Readmission Risk Interventions    03/07/2022   12:00 PM  Readmission Risk Prevention Plan  Transportation Screening Complete  PCP or Specialist Appt within 5-7 Days Complete  Home Care Screening Complete  Medication Review (RN CM) Complete

## 2022-09-16 NOTE — TOC Progression Note (Signed)
Transition of Care [facility]) - Progression Note    Patient Details  Name: Kaitlin Gomez MRN: [id number] Date of Birth: 15-Apr-1935  Transition of Care [facility]) CM/SW Contact  Bing Quarry, RN Phone Number: 09/16/2022, 9:58 AM  Clinical Narrative:   09/16/22: Kaitlin Gomez to patient regarding [facility] PT recommendations on discharge. Patient stated she would be fine without it. Explained to contact PCP if need changes once patient is at home. Verbalized understanding of this process. Dr. Darreld Mclean is PCP confirmed. NO DME recommended. DC today after Hbg checked at 1 pm and if stable. Patient was not inclined to answer further questions at this time. Contact TOC is any other needs come up. Gabriel Cirri RN CM [phone]         Expected Discharge Plan and Services                                               Social Determinants of Health (SDOH) Interventions SDOH Screenings   Food Insecurity: No Food Insecurity (09/13/2022)  Housing: Low Risk  (09/13/2022)  Transportation Needs: No Transportation Needs (09/13/2022)  Utilities: Not At Risk (09/13/2022)  Tobacco Use: Low Risk  (09/13/2022)    Readmission Risk Interventions    03/07/2022   12:00 PM  Readmission Risk Prevention Plan  Transportation Screening Complete  PCP or Specialist Appt within 5-7 Days Complete  Home Care Screening Complete  Medication Review (RN CM) Complete

## 2022-09-16 NOTE — Discharge Summary (Signed)
Physician Discharge Summary   Patient: Kaitlin Gomez MRN: [id number] DOB: [birth year]  Admit date:     09/13/2022  Discharge date: 09/17/22  Discharge Physician: Pennie Banter   PCP: Leanna Sato, MD   Recommendations at discharge:    Follow up with Primary Care in 1-2 weeks Repeat CBC, BMP in 1-2 weeks  Discharge Diagnoses: Active Problems:   Sinus bradycardia   HTN (hypertension)   Hyperlipidemia  Principal Problem (Resolved):   Lower GI bleeding Resolved Problems:   Hypokalemia  Hospital Course: HPI on admission 4/18: "Kaitlin Gomez is a 88 y.o. female with medical history significant of diverticulosis with lower GI bleeding in October 2023 s/p embolization procedure, Crohn's disease, HTN, bradycardia who presented to the ED this AM for evaluation of rectal bleeding, maroon in color.  Pt reports onset around 9:00 this AM.  Denies associated abdominal pain.  Does endorse nausea with no vomiting, similar to her prior bleeding experience.  Denies chest pain, shortness of breath, fatigue, generalized weakness, fever/chills, recent illnesses.  She denies any other recent or acute medical complaints.  Not on any blood thinners, takes aspirin.  No NSAID use.   ED course -- BP 157/64, otherwise normal vitals. Labs --- Normal CBC including Hbg 13.4.  CMP normal except for glucose 130, Cr 1.02, GFR 54.     CTA abdomen/pelvis with area of hyperenhancement in the cecum, compatible with GI hemorrhage in that area.     Interventional radiology was consulted by EDP, reviewed the case and imaging in detail.  They currently do not recommend pursuing embolization due to patient being stable with normal hemoglobin, and the finding was felt to be subtle / borderline. Intervention felt more risk than benefit at this time.   Patient is admitted for close monitoring, trending Hbg. IR and GI are consulted."  4/19 -- for colonoscopy today, did prep overnight.  Some bleeding overnight, none  this AM.  Hbg 10.3 with some dilutional component with IV fluids.  4/20 -- tolerating diet.  Hbg declining, this AM 8.6, 8.4  4/21 -- no further bleeding.  Hbg stable 8.6 >> 8.3 >> 8.4 Medically stable for discharge.  Assessment and Plan: * Lower GI bleeding-resolved as of 09/17/2022 History of diverticular bleeding s/p embolization (Oct '23) No abdominal pain.  Not on anticoagulation, only ASA. Suspect this is recurrent diverticular bleeding, pt has known pan-diverticulosis.  Also hx of Crohn's per chart, but pt not clear about this diagnosis, not on meds for it. Hbg on admission 13.4 >> 10.3 this AM --IR and GI are consulted --GI plan for colonoscopy today, prepped overnight.   --Diet resumed, tolerating --Stop IV fluids --Trend Hbg Q6H --Transfuse if Hbg < 7 or if <8 and active bleeding --Repeat CTA or tagged RBC scan stat if significant active bleed --Hold aspirin --SCD's for DVT prophylaxis --Follow up GI recommendations  Sinus bradycardia Sinus tachycardia - not POA, due to holding her metoprolol which has been resumed (4/20). Bradycardia likely due to metoprolol.  Asymptomatic.  Hypokalemia-resolved as of 09/17/2022 K 3.4 this AM, IV K riders x 3 ordered --Monitor BMP, replace K PRN  HTN (hypertension) BP on admission 157/64, have since been mildly elevated.   --PRN IV hydralazine  --Continue amlodipine  --Resume home metoprolol 50 mg BID --Continue holding other antihypertensives, allow BP room in case of significant bleeding.  (Lasix 40 mg daily, lisinopril 40 mg daily)  Hyperlipidemia Resume statin         Consultants: GI  Procedures performed: Colonoscopy  Disposition: Home Diet recommendation:  Cardiac diet DISCHARGE MEDICATION: Allergies as of 09/16/2022       Reactions   Advil [ibuprofen] Rash        Medication List     TAKE these medications    amLODipine 10 MG tablet Commonly known as: NORVASC Take 10 mg by mouth daily.   aspirin EC  81 MG tablet Take 81 mg by mouth daily. Swallow whole.   furosemide 40 MG tablet Commonly known as: LASIX Take 40 mg by mouth daily.   lisinopril 40 MG tablet Commonly known as: ZESTRIL Take 40 mg by mouth daily.   lovastatin 40 MG tablet Commonly known as: MEVACOR Take 40 mg by mouth at bedtime.   metFORMIN 500 MG 24 hr tablet Commonly known as: GLUCOPHAGE-XR Take 500 mg by mouth daily.   metoprolol tartrate 50 MG tablet Commonly known as: LOPRESSOR Take 1 tablet (50 mg total) by mouth 2 (two) times daily.   OneTouch Ultra test strip Generic drug: glucose blood 1 each by Other route.   Oscal 500/200 D-3 500-200 MG-UNIT tablet Generic drug: calcium-vitamin D Take 1 tablet by mouth daily.        Discharge Exam: Filed Weights   09/13/22 1000  Weight: 90.7 kg   General exam: awake, alert, no acute distress HEENT: atraumatic, clear conjunctiva, anicteric sclera, moist mucus membranes, hearing grossly normal  Respiratory system: CTAB, no wheezes, rales or rhonchi, normal respiratory effort. Cardiovascular system: normal S1/S2, RRR, no JVD, murmurs, rubs, gallops, no pedal edema.   Gastrointestinal system: soft, NT, ND, no HSM felt, +bowel sounds. Central nervous system: A&O x4. no gross focal neurologic deficits, normal speech Extremities: moves all, no edema, normal tone Skin: dry, intact, normal temperature, normal color, No rashes, lesions or ulcers Psychiatry: normal mood, congruent affect, judgement and insight appear normal   Condition at discharge: stable  The results of significant diagnostics from this hospitalization (including imaging, microbiology, ancillary and laboratory) are listed below for reference.   Imaging Studies: CT Angio Abd/Pel W and/or Wo Contrast  Result Date: 09/13/2022 CLINICAL DATA:  88 year old female with lower gastrointestinal hemorrhage. EXAM: CTA ABDOMEN AND PELVIS WITHOUT AND WITH CONTRAST TECHNIQUE: Multidetector CT imaging of  the abdomen and pelvis was performed using the standard protocol during bolus administration of intravenous contrast. Multiplanar reconstructed images and MIPs were obtained and reviewed to evaluate the vascular anatomy. RADIATION DOSE REDUCTION: This exam was performed according to the departmental dose-optimization program which includes automated exposure control, adjustment of the mA and/or kV according to patient size and/or use of iterative reconstruction technique. CONTRAST:  OMNIPAQUE IOHEXOL 350 MG/ML SOLN COMPARISON:  02/25/2022 FINDINGS: VASCULAR Aorta: Normal caliber aorta without aneurysm, dissection, vasculitis or significant stenosis. Scattered atherosclerotic calcifications. Celiac: Patent without evidence of aneurysm, dissection, vasculitis or significant stenosis. SMA: Patent without evidence of aneurysm, dissection, vasculitis or significant stenosis. Renals: Dual bilateral renal arteries with moderate ostial stenoses about the main bilateral renal artery secondary to atherosclerotic plaque. IMA: Diminutive. Inflow: Patent without evidence of aneurysm, dissection, vasculitis or significant stenosis. Proximal Outflow: Bilateral common femoral and visualized portions of the superficial and profunda femoral arteries are patent without evidence of aneurysm, dissection, vasculitis or significant stenosis. Veins: No obvious venous abnormality within the limitations of this arterial phase study. Review of the MIP images confirms the above findings. NON-VASCULAR Lower chest: No acute abnormality. Hepatobiliary: No focal liver abnormality is seen. No gallstones, gallbladder wall thickening, or biliary dilatation. Pancreas: Similar  appearing fatty atrophy in the body and head. No pancreatic ductal dilatation or surrounding inflammatory changes. Spleen: Normal in size without focal abnormality. Adrenals/Urinary Tract: Adrenal glands are unremarkable. Unchanged appearance of left inferior pole irregular  shaped renal calculus measuring up to 6 mm. Kidneys are otherwise normal, without new renal calculi, focal lesion, or hydronephrosis. Bladder is unremarkable. Stomach/Bowel: Stomach is within normal limits. Appendix appears normal. About the mesenteric aspect of the cecum, between the ileal and appendiceal orifices is a focus of hyperenhancement most conspicuous on delayed venous phase (series 19, images 52 and 53, series 16, image 129), less conspicuous on arterial phase. This focus appears to arise from a distal arterial branch just inferior to the previously embolized distal right colic artery branch. Diffuse colonic diverticulosis. No evidence of bowel wall thickening, distention, or inflammatory changes. Lymphatic: No abdominopelvic lymphadenopathy. Reproductive: Status post hysterectomy. No adnexal masses. Other: No abdominal wall hernia or abnormality. No abdominopelvic ascites. Musculoskeletal: No acute osseous abnormality. Similar appearing multilevel degenerative changes of the visualized thoracolumbar spine. IMPRESSION: 1. There is a focus of hyperenhancement in the cecum, most prominent on delayed venous phase, most compatible with etiology of lower gastrointestinal hemorrhage. 2. Pan colonic diverticulosis. 3. Nonobstructive left nephrolithiasis. 4.  Aortic Atherosclerosis (ICD10-I70.0). Marliss Coots, MD [facility]iology Electronically Signed   By: Marliss Coots M.D.   On: 09/13/2022 13:39    Microbiology: Results for orders placed or performed during the hospital encounter of 01/23/15  Culture, blood (routine x 2)     Status: None   Collection Time: 01/23/15  6:30 AM   Specimen: BLOOD  Result Value Ref Range Status   Specimen Description BLOOD RIGHT ASSIST CONTROL  Final   Special Requests BOTTLES DRAWN AEROBIC AND ANAEROBIC  7CC  Final   Culture NO GROWTH 5 DAYS  Final   Report Status 01/28/2015 FINAL  Final  Culture, blood (routine x  2)     Status: None   Collection Time: 01/23/15  7:30 AM   Specimen: BLOOD  Result Value Ref Range Status   Specimen Description BLOOD RIGHT ASSIST CONTROL  Final   Special Requests BOTTLES DRAWN AEROBIC AND ANAEROBIC  4CC  Final   Culture NO GROWTH 5 DAYS  Final   Report Status 01/28/2015 FINAL  Final  Urine culture     Status: None   Collection Time: 01/23/15  8:02 AM   Specimen: Urine, Random  Result Value Ref Range Status   Specimen Description URINE, RANDOM  Final   Special Requests Normal  Final   Culture MULTIPLE ORGANISMS PRESENT, NONE PREDOMINANT  Final   Report Status 01/24/2015 FINAL  Final    Labs: CBC: Recent Labs  Lab 09/13/22 1004 09/13/22 1604 09/14/22 0543 09/14/22 2149 09/15/22 0511 09/15/22 0938 09/15/22 1544 09/16/22 0419 09/16/22 1325  WBC 6.0  --  9.6  --  10.9*  --   --  10.1  --   HGB 13.4   < > 10.3*   < > 8.6* 8.4* 8.6* 8.3* 8.4*  HCT 42.9   < > 31.7*   < > 26.9* 26.2* 26.8* 26.5* 26.5*  MCV 87.2  --  85.0  --  86.5  --   --  88.6  --   PLT 256  --  244  --  218  --   --  210  --    < > = values in this interval not displayed.   Basic Metabolic Panel: Recent Labs  Lab 09/13/22 1004 09/14/22 0543 09/15/22 0511 09/16/22 0419  NA 139 140 141 140  K 3.7 3.4* 3.2* 4.2  CL 107 106 110 110  CO2 GLUCOSE 130* 139* 119* 125*  BUN CREATININE 1.02* 0.89 0.87 0.90  CALCIUM 9.5 9.1 8.8* 9.1  MG  --  1.7 1.7  --    Liver Function Tests: Recent Labs  Lab 09/13/22 1004  AST 23  ALT 13  ALKPHOS 61  BILITOT 0.9  PROT 7.6  ALBUMIN 4.0   CBG: No results for input(s): "GLUCAP" in the last 168 hours.  Discharge time spent: less than 30 minutes.  Signed: Pennie Banter, DO [facility] 09/17/2022

## 2022-09-16 NOTE — Evaluation (Signed)
Physical Therapy Evaluation Patient Details Name: Kaitlin Gomez MRN: [id number] DOB: 1934-10-11 Today's Date: 09/16/2022  History of Present Illness  presented to ER secondary to rectal bleeding; admitted for management of lower GIB (likely related to recurrent diverticular bleed)  Clinical Impression  Patient resting in bed upon arrival to room; alert and oriented, follows commands and agreeable for participation with session.  Denies pain; does endorse some OOB activity (to [facility] and back) throughout admission.  Bilat UE/LE strength and ROM grossly symmetrical and WFL for basic transfers and gait; no focal weakness appreciated.  Able to complete bed mobility with mod indep; sit/stand, basic transfers and gait (200') with RW, cga/min assist.  Demonstrates reciprocal stepping pattern with fair step height/length; forward flexed posture, mild sway in A/P plan when attempting to correct; decreased cadence/gait speed (10' walk time, 11-12 seconds). Standing rest break x1-2 required to complete distance; voiced understanding of education related to activity pacing. Do recommend continued use of RW due to generalized weakness and for overall energy conservation    Would benefit from skilled PT to address above deficits and promote optimal return to PLOF.; recommend post-acute PT follow up at discharge per primary care team.   Recommendations for follow up therapy are one component of a multi-disciplinary discharge planning process, led by the attending physician.  Recommendations may be updated based on patient status, additional functional criteria and insurance authorization.  Follow Up Recommendations       Assistance Recommended at Discharge PRN  Patient can return home with the following  A little help with walking and/or transfers;A little help with bathing/dressing/bathroom    Equipment Recommendations    Recommendations for Other Services       Functional Status Assessment Patient has had  a recent decline in their functional status and demonstrates the ability to make significant improvements in function in a reasonable and predictable amount of time.     Precautions / Restrictions Precautions Precautions: Fall Restrictions Weight Bearing Restrictions: No      Mobility  Bed Mobility Overal bed mobility: Modified Independent                  Transfers Overall transfer level: Needs assistance Equipment used: Rolling walker (2 wheels), None Transfers: Sit to/from Stand, Bed to chair/wheelchair/BSC Sit to Stand: Min guard Stand pivot transfers: Min guard         General transfer comment: does require UE support for optimal safety/stability; multiple attempts/momentum required fro initial sit/stand (improved with repetition and from higher seating surfaces)    Ambulation/Gait Ambulation/Gait assistance: Min guard Gait Distance (Feet): 200 Feet Assistive device: Rolling walker (2 wheels)   Gait velocity: 10' walk time, 11-12 seconds     General Gait Details: reciprocal stepping pattern with fair step height/length; forward flexed posture, mild sway in A/P plan when attempting to correct.  Standing rest break x1-2 required to complete distance; voiced understanding of education related to activity pacing.  Do recommend continued use of RW due to generalized weakness and for overall energy conservation  Stairs            Wheelchair Mobility    Modified Rankin (Stroke Patients Only)       Balance Overall balance assessment: Needs assistance Sitting-balance support: No upper extremity supported, Feet supported Sitting balance-Leahy Scale: Good     Standing balance support: Bilateral upper extremity supported Standing balance-Leahy Scale: Fair  Pertinent Vitals/Pain Pain Assessment Pain Assessment: No/denies pain    Home Living Family/patient expects to be discharged to:: Private residence Living  Arrangements: Children Available Help at Discharge: Family;Available 24 hours/day Type of Home: House Home Access: Level entry       Home Layout: One level Home Equipment: Cane - single Librarian, academic (2 wheels)      Prior Function Prior Level of Function : Independent/Modified Independent;Driving             Mobility Comments: using cane as needed for ADLs, household and community mobility; denies fall history; + driving ADLs Comments: ADLs, IADLs, and driving. Enjoys participating in her church's choir.     Hand Dominance        Extremity/Trunk Assessment   Upper Extremity Assessment Upper Extremity Assessment: Overall WFL for tasks assessed    Lower Extremity Assessment Lower Extremity Assessment: Overall WFL for tasks assessed (grossly 4/5 throughout)       Communication   Communication: No difficulties  Cognition Arousal/Alertness: Awake/alert Behavior During Therapy: WFL for tasks assessed/performed Overall Cognitive Status: Within Functional Limits for tasks assessed                                          General Comments      Exercises Other Exercises Other Exercises: Toilet transfer, SPT without assist device, cga/min assist; sit/stand, standing balance for hygiene/peri-care, cga   Assessment/Plan    PT Assessment Patient needs continued PT services  PT Problem List Decreased activity tolerance;Decreased balance;Decreased mobility;Decreased strength;Decreased knowledge of use of DME;Decreased safety awareness;Decreased knowledge of precautions;Cardiopulmonary status limiting activity       PT Treatment Interventions DME instruction;Gait training;Functional mobility training;Therapeutic activities;Therapeutic exercise;Balance training;Patient/family education    PT Goals (Current goals can be found in the Care Plan section)  Acute Rehab PT Goals Patient Stated Goal: to go home today! PT Goal Formulation: With  patient Time For Goal Achievement: 09/30/22 Potential to Achieve Goals: Good    Frequency Min 3X/week     Co-evaluation               AM-PAC PT "6 Clicks" Mobility  Outcome Measure Help needed turning from your back to your side while in a flat bed without using bedrails?: None Help needed moving from lying on your back to sitting on the side of a flat bed without using bedrails?: None Help needed moving to and from a bed to a chair (including a wheelchair)?: A Little Help needed standing up from a chair using your arms (e.g., wheelchair or bedside chair)?: A Little Help needed to walk in hospital room?: A Little Help needed climbing 3-5 steps with a railing? : A Little 6 Click Score: 20    End of Session Equipment Utilized During Treatment: Gait belt Activity Tolerance: Patient tolerated treatment well Patient left: in bed;with bed alarm set;with call bell/phone within reach Nurse Communication: Mobility status PT Visit Diagnosis: Muscle weakness (generalized) (M62.81);Difficulty in walking, not elsewhere classified (R26.2)    Time: 4696-2952 PT Time Calculation (min) (ACUTE ONLY): 17 min   Charges:   PT Evaluation $PT Eval Low Complexity: 1 Low          Durell Lofaso H. Manson Passey, PT, DPT, NCS 09/16/22, 9:48 AM [phone]

## 2022-09-17 ENCOUNTER — Encounter: Payer: Self-pay | Admitting: Gastroenterology

## 2022-09-17 NOTE — Anesthesia Postprocedure Evaluation (Signed)
Anesthesia Post Note  Patient: Iona Hansen  Procedure(s) Performed: COLONOSCOPY WITH PROPOFOL  Patient location during evaluation: PACU Anesthesia Type: General Level of consciousness: awake and alert Pain management: pain level controlled Vital Signs Assessment: post-procedure vital signs reviewed and stable Respiratory status: spontaneous breathing, nonlabored ventilation and respiratory function stable Cardiovascular status: blood pressure returned to baseline and stable Postop Assessment: no apparent nausea or vomiting Anesthetic complications: no   No notable events documented.   Last Vitals:  Vitals:   09/16/22 0615 09/16/22 0803  BP: (!) 149/68 (!) 160/68  Pulse: 81 85  Resp:  20  Temp:  37.1 C  SpO2: 100% 100%    Last Pain:  Vitals:   09/16/22 0950  TempSrc:   PainSc: 0-No pain                 Foye Deer

## 2022-12-06 ENCOUNTER — Ambulatory Visit: Payer: Medicare HMO | Admitting: Vascular Surgery

## 2022-12-06 ENCOUNTER — Encounter (HOSPITAL_COMMUNITY): Payer: Medicare HMO

## 2023-02-05 ENCOUNTER — Encounter: Payer: Self-pay | Admitting: Emergency Medicine

## 2023-02-05 ENCOUNTER — Emergency Department
Admission: EM | Admit: 2023-02-05 | Discharge: 2023-02-05 | Disposition: A | Discharge status: Home / Self Care | Payer: Medicare HMO | Attending: Emergency Medicine | Admitting: Emergency Medicine

## 2023-02-05 ENCOUNTER — Emergency Department: Payer: Medicare HMO

## 2023-02-05 ENCOUNTER — Other Ambulatory Visit: Payer: Self-pay

## 2023-02-05 DIAGNOSIS — E119 Type 2 diabetes mellitus without complications: Secondary | ICD-10-CM | POA: Insufficient documentation

## 2023-02-05 DIAGNOSIS — Z7982 Long term (current) use of aspirin: Secondary | ICD-10-CM | POA: Insufficient documentation

## 2023-02-05 DIAGNOSIS — Z79899 Other long term (current) drug therapy: Secondary | ICD-10-CM | POA: Insufficient documentation

## 2023-02-05 DIAGNOSIS — Z7984 Long term (current) use of oral hypoglycemic drugs: Secondary | ICD-10-CM | POA: Diagnosis not present

## 2023-02-05 DIAGNOSIS — M7989 Other specified soft tissue disorders: Secondary | ICD-10-CM | POA: Diagnosis present

## 2023-02-05 DIAGNOSIS — L03011 Cellulitis of right finger: Secondary | ICD-10-CM | POA: Diagnosis not present

## 2023-02-05 LAB — COMPREHENSIVE METABOLIC PANEL
ALT: 18 U/L (ref 0–44)
AST: 20 U/L (ref 15–41)
Albumin: 3.7 g/dL (ref 3.5–5.0)
Alkaline Phosphatase: 58 U/L (ref 38–126)
Anion gap: 8 (ref 5–15)
BUN: 19 mg/dL (ref 8–23)
CO2: 24 mmol/L (ref 22–32)
Calcium: 9.3 mg/dL (ref 8.9–10.3)
Chloride: 104 mmol/L (ref 98–111)
Creatinine, Ser: 0.88 mg/dL (ref 0.44–1.00)
GFR, Estimated: >60 mL/min (ref >60)
Glucose, Bld: 123 mg/dL — ABNORMAL HIGH (ref 70–99)
Potassium: 3.7 mmol/L (ref 3.5–5.1)
Sodium: 136 mmol/L (ref 135–145)
Total Bilirubin: 1.1 mg/dL (ref 0.3–1.2)
Total Protein: 7.3 g/dL (ref 6.5–8.1)

## 2023-02-05 LAB — CBC
HCT: 40.5 % (ref 36.0–46.0)
Hemoglobin: 13 g/dL (ref 12.0–15.0)
MCH: 27.3 pg (ref 26.0–34.0)
MCHC: 32.1 g/dL (ref 30.0–36.0)
MCV: 85.1 fL (ref 80.0–100.0)
Platelets: 220 10*3/uL (ref 150–400)
RBC: 4.76 MIL/uL (ref 3.87–5.11)
RDW: 16 % — ABNORMAL HIGH (ref 11.5–15.5)
WBC: 11.5 10*3/uL — ABNORMAL HIGH (ref 4.0–10.5)
nRBC: 0 % (ref 0.0–0.2)

## 2023-02-05 LAB — LACTIC ACID, PLASMA: Lactic Acid, Venous: 1.2 mmol/L (ref 0.5–1.9)

## 2023-02-05 MED ORDER — HYDROCODONE-ACETAMINOPHEN 5-325 MG PO TABS: 1.0000 | ORAL_TABLET | Freq: Four times a day (QID) | ORAL | 0 refills | Status: DC | PRN

## 2023-02-05 MED ORDER — HYDROCODONE-ACETAMINOPHEN 5-325 MG PO TABS
1.0000 | ORAL_TABLET | ORAL | Status: AC
  Administered 2023-02-05: 1 via ORAL
  Filled 2023-02-05: qty 1

## 2023-02-05 MED ORDER — DOXYCYCLINE HYCLATE 100 MG PO TABS
100.0000 mg | ORAL_TABLET | Freq: Once | ORAL | Status: AC
  Administered 2023-02-05: 100 mg via ORAL
  Filled 2023-02-05: qty 1

## 2023-02-05 MED ORDER — LIDOCAINE HCL (PF) 1 % IJ SOLN
5.0000 mL | Freq: Once | INTRAMUSCULAR | Status: AC
  Administered 2023-02-05: 5 mL
  Filled 2023-02-05: qty 5

## 2023-02-05 MED ORDER — DOXYCYCLINE MONOHYDRATE 100 MG PO TABS: 100.0000 mg | ORAL_TABLET | Freq: Two times a day (BID) | ORAL | 0 refills | Status: AC

## 2023-02-05 MED ORDER — CEPHALEXIN 500 MG PO CAPS
500.0000 mg | ORAL_CAPSULE | Freq: Once | ORAL | Status: AC
  Administered 2023-02-05: 500 mg via ORAL
  Filled 2023-02-05: qty 1

## 2023-02-05 MED ORDER — CEPHALEXIN 500 MG PO CAPS: 500.0000 mg | ORAL_CAPSULE | Freq: Four times a day (QID) | ORAL | 0 refills | Status: AC

## 2023-02-05 NOTE — ED Provider Notes (Signed)
Shared visit   Past medical history significant for diabetes who presents to the emergency department with concern for finger infection.  Exam is consistent with a felon.  No signs of osteomyelitis.  Lab work reassuring.  Incision and drainage at bedside.  After discussion with orthopedics will follow-up as an outpatient with Dr. Joice Lofts.    Corena Herter, MD 02/06/23 0002

## 2023-02-05 NOTE — Discharge Instructions (Signed)
Please soak your thumb in half warm water, half hydrogen peroxide 3 times a day for at least 20 minutes.  Keep thumb covered.  Take antibiotics as prescribed and follow-up with orthopedist in 2 to 3 days for recheck

## 2023-02-05 NOTE — ED Triage Notes (Signed)
Pt here with a right thumb infection. Pt denies injury. Pt right thumb is swollen and painful. Pt states she has tried hydrogen peroxide and alcohol with no relief. Pt stable in triage.

## 2023-02-05 NOTE — ED Provider Notes (Signed)
[place] EMERGENCY DEPARTMENT AT [facility] Provider Note   CSN: [id number] Arrival date & time: 02/05/23  1450     History  Chief Complaint  Patient presents with   Blood Infection    Kaitlin Gomez is a 88 y.o. female presents to the emergency department for evaluation of a swollen right thumb.  Patient has had right thumb pain, swelling x 3 days.  She denies any trauma injury or bites.  Patient states that pain is moderate.  She is right-hand dominant.  No known fevers although running a low-grade temp of 99.6 here in the ED.  No previous infections to the area.  She is diabetic.  A1c well-controlled.  HPI     Home Medications Prior to Admission medications   Medication Sig Start Date End Date Taking? Authorizing Provider  amLODipine (NORVASC) 10 MG tablet Take 10 mg by mouth daily. 04/04/22   [provider]  aspirin EC 81 MG tablet Take 81 mg by mouth daily. Swallow whole.    [provider]  calcium-vitamin D (OSCAL 500/200 D-3) 500-200 MG-UNIT per tablet Take 1 tablet by mouth daily.    [provider]  furosemide (LASIX) 40 MG tablet Take 40 mg by mouth daily. 03/13/22   [provider]  lisinopril (ZESTRIL) 40 MG tablet Take 40 mg by mouth daily. 04/04/22   [provider]  lovastatin (MEVACOR) 40 MG tablet Take 40 mg by mouth at bedtime.    [provider]  metFORMIN (GLUCOPHAGE-XR) 500 MG 24 hr tablet Take 500 mg by mouth daily. 10/09/16   [provider]  metoprolol tartrate (LOPRESSOR) 50 MG tablet Take 1 tablet (50 mg total) by mouth 2 (two) times daily. 03/07/22   Ghimire, Werner Lean, MD  [facility] ULTRA test strip 1 each by Other route. 03/13/22   [provider]      Allergies    Advil [ibuprofen]    Review of Systems   Review of Systems  Physical Exam Updated Vital Signs BP (!) 157/61   Pulse (!) 53   Temp 99.6 F (37.6 C) (Oral)   Resp 18   Ht 5' 7.5" (1.715 m)   Wt 90.7  kg   SpO2 97%   BMI 30.86 kg/m  Physical Exam Constitutional:      Appearance: She is well-developed.  HENT:     Head: Normocephalic and atraumatic.  Eyes:     Conjunctiva/sclera: Conjunctivae normal.  Cardiovascular:     Rate and Rhythm: Normal rate.  Pulmonary:     Effort: Pulmonary effort is normal. No respiratory distress.  Musculoskeletal:        General: Normal range of motion.     Cervical back: Normal range of motion.     Comments: Diffuse swelling right thumb with paronychial abscess along the radial aspect of the thumb/radial nail border.  Pulp space swelling redness and tenderness to palpation.  There is erythema and swelling extending into the base of the thumb.  Nontender along the flexor tendon of the thumb.  Mild erythema extending down into the base of the thumb.  Skin:    General: Skin is warm.     Findings: No rash.  Neurological:     Mental Status: She is alert and oriented to person, place, and time.  Psychiatric:        Behavior: Behavior normal.        Thought Content: Thought content normal.     ED Results / Procedures /  Treatments   Labs (all labs ordered are listed, but only abnormal results are displayed) Labs Reviewed  CBC - Abnormal; Notable for the following components:      Result Value   WBC 11.5 (*)    RDW 16.0 (*)    All other components within normal limits  COMPREHENSIVE METABOLIC PANEL - Abnormal; Notable for the following components:   Glucose, Bld 123 (*)    All other components within normal limits  LACTIC ACID, PLASMA  LACTIC ACID, PLASMA    EKG None  Radiology No results found.  Procedures .Marland KitchenIncision and Drainage  Date/Time: 02/05/2023 7:38 PM  Performed by: Evon Slack, PA-C Authorized by: Evon Slack, PA-C   Consent:    Consent obtained:  Verbal   Consent given by:  Patient Universal protocol:    Patient identity confirmed:  Verbally with patient Location:    Type:  Abscess   Location:  Upper  extremity   Upper extremity location:  Finger   Finger location:  R thumb Anesthesia:    Anesthesia method:  Nerve block   Block needle gauge:  27 G   Block anesthetic:  Lidocaine 1% w/o epi   Block technique:  Digital block thumb   Block injection procedure:  Anatomic landmarks identified, introduced needle and negative aspiration for blood   Block outcome:  Anesthesia achieved Procedure type:    Complexity:  Simple Procedure details:    Incision types:  Stab incision   Incision depth:  Subcutaneous   Wound management:  Probed and deloculated   Drainage:  Bloody and purulent   Drainage amount:  Copious   Wound treatment:  Wound left open Post-procedure details:    Procedure completion:  Tolerated well, no immediate complications     Medications Ordered in ED Medications  lidocaine (PF) (XYLOCAINE) 1 % injection 5 mL (has no administration in time range)  HYDROcodone-acetaminophen (NORCO/VICODIN) 5-325 MG per tablet 1 tablet (has no administration in time range)    ED Course/ Medical Decision Making/ A&P                                 Medical Decision Making Amount and/or Complexity of Data Reviewed Labs: ordered. Radiology: ordered.  Risk Prescription drug management.   88 year old female with paronychial infection to the right thumb.  Early pulp space infection with abscess.  No signs of flexor tenosynovitis.  Afebrile, white count slightly elevated negative lactic.  Will place on Doxy and cephalexin.  Cultures are pending.  I&D performed and patient tolerated the procedure well.  Copious amounts of purulent discharge removed from the abscess.  She will soak 3 times a day in warm water and hydrogen peroxide.  She will follow-up in orthopedics in a couple days for recheck.  She understands signs and symptoms return to the ER for. Final Clinical Impression(s) / ED Diagnoses Final diagnoses:  Acute paronychia of right thumb    Rx / DC Orders ED Discharge Orders      None         Ronnette Juniper 02/05/23 1940    Corena Herter, MD 02/06/23 0002

## 2023-02-08 LAB — AEROBIC/ANAEROBIC CULTURE W GRAM STAIN (SURGICAL/DEEP WOUND): Special Requests: NORMAL

## 2023-02-09 NOTE — Progress Notes (Signed)
ED Antimicrobial Stewardship Positive Culture Follow Up   Kaitlin Gomez is an 88 y.o. female who presented to [facility] on 02/05/2023 with a chief complaint of  Chief Complaint  Patient presents with   Blood Infection    Recent Results (from the past 720 hour(s))  Aerobic/Anaerobic Culture w Gram Stain (surgical/deep wound)     Status: None   Collection Time: 02/05/23  7:18 PM   Specimen: Abscess  Result Value Ref Range Status   Specimen Description   Final    ABSCESS Performed at [facility], [address], [place], [place] [address]    Special Requests   Final    Normal Performed at [facility], [address] [place], [place] [address]    Gram Stain   Final    RARE WBC PRESENT, PREDOMINANTLY PMN MODERATE GRAM POSITIVE COCCI FEW GRAM NEGATIVE RODS    Culture   Final    RARE KLEBSIELLA PNEUMONIAE FEW HAEMOPHILUS PARAINFLUENZAE ABUNDANT BACTEROIDES FRAGILIS BETA LACTAMASE POSITIVE Performed at [facility], [address], [place], [place] [address]    Report Status 02/08/2023 FINAL  Final   Organism ID, Bacteria KLEBSIELLA PNEUMONIAE  Final      Susceptibility   Klebsiella pneumoniae - MIC*    AMPICILLIN RESISTANT Resistant     CEFEPIME <=0.12 SENSITIVE Sensitive     CEFTAZIDIME <=1 SENSITIVE Sensitive     CEFTRIAXONE <=0.25 SENSITIVE Sensitive     CIPROFLOXACIN <=0.25 SENSITIVE Sensitive     GENTAMICIN <=1 SENSITIVE Sensitive     IMIPENEM <=0.25 SENSITIVE Sensitive     TRIMETH/SULFA <=20 SENSITIVE Sensitive     AMPICILLIN/SULBACTAM 4 SENSITIVE Sensitive     PIP/TAZO <=4 SENSITIVE Sensitive     * RARE KLEBSIELLA PNEUMONIAE    [x]  Treated with cephalexin and doxycycline, bacteroides fragilis organism not covered by antibiotics  New antibiotic prescription: Augmentin 875 mg po Q12h x 7 days which should cover the current organisms  ED Provider: Corena Herter  02/09/23:  attempted to contact pt via phone [phone] and was hung up  on 3x.  Called in above prescription to [facility] on [address] (where previous Rxs had been called) [phone].   Will attempt to contact patient again tomorrow  Shavana Calder A 02/09/2023, 3:34 PM Clinical Pharmacist

## 2024-02-13 ENCOUNTER — Other Ambulatory Visit: Payer: Self-pay

## 2024-02-13 ENCOUNTER — Encounter: Payer: Self-pay | Admitting: *Deleted

## 2024-02-13 ENCOUNTER — Inpatient Hospital Stay
Admission: EM | Admit: 2024-02-13 | Discharge: 2024-02-19 | DRG: 064 | Disposition: A | Discharge status: Skilled Nursing Facility | Attending: Hospitalist | Admitting: Hospitalist

## 2024-02-13 ENCOUNTER — Emergency Department

## 2024-02-13 DIAGNOSIS — Z7984 Long term (current) use of oral hypoglycemic drugs: Secondary | ICD-10-CM

## 2024-02-13 DIAGNOSIS — W19XXXA Unspecified fall, initial encounter: Secondary | ICD-10-CM | POA: Diagnosis not present

## 2024-02-13 DIAGNOSIS — I480 Paroxysmal atrial fibrillation: Secondary | ICD-10-CM | POA: Diagnosis present

## 2024-02-13 DIAGNOSIS — Z7901 Long term (current) use of anticoagulants: Secondary | ICD-10-CM

## 2024-02-13 DIAGNOSIS — Z8249 Family history of ischemic heart disease and other diseases of the circulatory system: Secondary | ICD-10-CM

## 2024-02-13 DIAGNOSIS — N200 Calculus of kidney: Secondary | ICD-10-CM | POA: Diagnosis present

## 2024-02-13 DIAGNOSIS — Z7982 Long term (current) use of aspirin: Secondary | ICD-10-CM

## 2024-02-13 DIAGNOSIS — E785 Hyperlipidemia, unspecified: Secondary | ICD-10-CM | POA: Diagnosis present

## 2024-02-13 DIAGNOSIS — R29707 NIHSS score 7: Secondary | ICD-10-CM | POA: Diagnosis present

## 2024-02-13 DIAGNOSIS — I4891 Unspecified atrial fibrillation: Secondary | ICD-10-CM

## 2024-02-13 DIAGNOSIS — Z79899 Other long term (current) drug therapy: Secondary | ICD-10-CM

## 2024-02-13 DIAGNOSIS — I2510 Atherosclerotic heart disease of native coronary artery without angina pectoris: Secondary | ICD-10-CM | POA: Diagnosis present

## 2024-02-13 DIAGNOSIS — I160 Hypertensive urgency: Secondary | ICD-10-CM | POA: Diagnosis not present

## 2024-02-13 DIAGNOSIS — E1151 Type 2 diabetes mellitus with diabetic peripheral angiopathy without gangrene: Secondary | ICD-10-CM | POA: Diagnosis present

## 2024-02-13 DIAGNOSIS — R531 Weakness: Secondary | ICD-10-CM

## 2024-02-13 DIAGNOSIS — M4802 Spinal stenosis, cervical region: Secondary | ICD-10-CM | POA: Diagnosis present

## 2024-02-13 DIAGNOSIS — Z794 Long term (current) use of insulin: Secondary | ICD-10-CM

## 2024-02-13 DIAGNOSIS — Y92009 Unspecified place in unspecified non-institutional (private) residence as the place of occurrence of the external cause: Secondary | ICD-10-CM

## 2024-02-13 DIAGNOSIS — I6389 Other cerebral infarction: Secondary | ICD-10-CM | POA: Diagnosis not present

## 2024-02-13 DIAGNOSIS — Z886 Allergy status to analgesic agent status: Secondary | ICD-10-CM

## 2024-02-13 DIAGNOSIS — E876 Hypokalemia: Secondary | ICD-10-CM | POA: Diagnosis present

## 2024-02-13 DIAGNOSIS — I739 Peripheral vascular disease, unspecified: Secondary | ICD-10-CM

## 2024-02-13 DIAGNOSIS — I10 Essential (primary) hypertension: Secondary | ICD-10-CM | POA: Diagnosis present

## 2024-02-13 DIAGNOSIS — N179 Acute kidney failure, unspecified: Principal | ICD-10-CM | POA: Diagnosis present

## 2024-02-13 DIAGNOSIS — G8194 Hemiplegia, unspecified affecting left nondominant side: Secondary | ICD-10-CM | POA: Diagnosis present

## 2024-02-13 DIAGNOSIS — W07XXXA Fall from chair, initial encounter: Secondary | ICD-10-CM | POA: Diagnosis present

## 2024-02-13 DIAGNOSIS — M6282 Rhabdomyolysis: Secondary | ICD-10-CM | POA: Diagnosis present

## 2024-02-13 DIAGNOSIS — N170 Acute kidney failure with tubular necrosis: Secondary | ICD-10-CM | POA: Diagnosis present

## 2024-02-13 DIAGNOSIS — Z8673 Personal history of transient ischemic attack (TIA), and cerebral infarction without residual deficits: Secondary | ICD-10-CM

## 2024-02-13 DIAGNOSIS — I2489 Other forms of acute ischemic heart disease: Secondary | ICD-10-CM | POA: Diagnosis present

## 2024-02-13 DIAGNOSIS — I639 Cerebral infarction, unspecified: Secondary | ICD-10-CM

## 2024-02-13 DIAGNOSIS — E119 Type 2 diabetes mellitus without complications: Secondary | ICD-10-CM

## 2024-02-13 DIAGNOSIS — Y92008 Other place in unspecified non-institutional (private) residence as the place of occurrence of the external cause: Secondary | ICD-10-CM

## 2024-02-13 DIAGNOSIS — I6529 Occlusion and stenosis of unspecified carotid artery: Secondary | ICD-10-CM

## 2024-02-13 DIAGNOSIS — I6522 Occlusion and stenosis of left carotid artery: Secondary | ICD-10-CM | POA: Diagnosis present

## 2024-02-13 LAB — BRAIN NATRIURETIC PEPTIDE: B Natriuretic Peptide: 39.8 pg/mL (ref 0.0–100.0)

## 2024-02-13 LAB — BASIC METABOLIC PANEL WITH GFR
Anion gap: 14 (ref 5–15)
BUN: 54 mg/dL — ABNORMAL HIGH (ref 8–23)
CO2: 23 mmol/L (ref 22–32)
Calcium: 9.3 mg/dL (ref 8.9–10.3)
Chloride: 102 mmol/L (ref 98–111)
Creatinine, Ser: 3.3 mg/dL — ABNORMAL HIGH (ref 0.44–1.00)
GFR, Estimated: 13 mL/min — ABNORMAL LOW (ref >60)
Glucose, Bld: 149 mg/dL — ABNORMAL HIGH (ref 70–99)
Potassium: 3.9 mmol/L (ref 3.5–5.1)
Sodium: 139 mmol/L (ref 135–145)

## 2024-02-13 LAB — HEPATIC FUNCTION PANEL
ALT: 21 U/L (ref 0–44)
AST: 30 U/L (ref 15–41)
Albumin: 3.5 g/dL (ref 3.5–5.0)
Alkaline Phosphatase: 58 U/L (ref 38–126)
Bilirubin, Direct: 0.5 mg/dL — ABNORMAL HIGH (ref 0.0–0.2)
Indirect Bilirubin: 0.8 mg/dL (ref 0.3–0.9)
Total Bilirubin: 1.3 mg/dL — ABNORMAL HIGH (ref 0.0–1.2)
Total Protein: 7.2 g/dL (ref 6.5–8.1)

## 2024-02-13 LAB — CBC
HCT: 43.7 % (ref 36.0–46.0)
Hemoglobin: 14.4 g/dL (ref 12.0–15.0)
MCH: 30.4 pg (ref 26.0–34.0)
MCHC: 33 g/dL (ref 30.0–36.0)
MCV: 92.4 fL (ref 80.0–100.0)
Platelets: 223 K/uL (ref 150–400)
RBC: 4.73 MIL/uL (ref 3.87–5.11)
RDW: 13.6 % (ref 11.5–15.5)
WBC: 10.6 K/uL — ABNORMAL HIGH (ref 4.0–10.5)
nRBC: 0 % (ref 0.0–0.2)

## 2024-02-13 NOTE — ED Triage Notes (Addendum)
 Pt to triage via wheelchair.  Pt brought in via ems from home.  Pt was sitting in a recliner and slipped out of the chair.  Pt reports bil leg weakness since this am.   Pt has swelling to lower legs.  No hip pain.   No chest pain or sob.   No neck or back pain.  No loc.  Pt alert  speech clear.

## 2024-02-13 NOTE — ED Provider Notes (Signed)
 [facility] Provider Note    Event Date/Time   First MD Initiated Contact with Patient 02/13/24 2217     (approximate)   History   Fall   HPI  Kaitlin Gomez is a 89 y.o. female who comes in with concerns for a fall.  She reports where she slid down onto the ground.  She denies hitting her head or any injuries from it.  She just reports that since this happened this morning she has been feeling really weak and unable to stand up like she typically does.  She denies any chest pain, shortness of breath, abdominal pain or any other concerns.    Physical Exam   Triage Vital Signs: ED Triage Vitals  Encounter Vitals Group     BP 02/13/24 2017 (!) 145/59     Girls Systolic BP Percentile --      Girls Diastolic BP Percentile --      Boys Systolic BP Percentile --      Boys Diastolic BP Percentile --      Pulse Rate 02/13/24 2017 (!) 56     Resp 02/13/24 2017 18     Temp 02/13/24 2017 98.6 F (37 C)     Temp Source 02/13/24 2017 Oral     SpO2 02/13/24 2011 97 %     Weight 02/13/24 2018 175 lb (79.4 kg)     Height 02/13/24 2018 5' 7 (1.702 m)     Head Circumference --      Peak Flow --      Pain Score 02/13/24 2018 0     Pain Loc --      Pain Education --      Exclude from Growth Chart --     Most recent vital signs: Vitals:   02/13/24 2011 02/13/24 2017  BP:  (!) 145/59  Pulse:  (!) 56  Resp:  18  Temp:  98.6 F (37 C)  SpO2: 97% 98%     General: Awake, no distress.  CV:  Good peripheral perfusion.  Resp:  Normal effort.  Abd:  No distention. Soft and non tender  Other:  Edema note, unable to lift up either leg. Good distal pulse.  Denies any pain in legs just feels weak. No trauma to head note    ED Results / Procedures / Treatments   Labs (all labs ordered are listed, but only abnormal results are displayed) Labs Reviewed  BASIC METABOLIC PANEL WITH GFR - Abnormal; Notable for the following components:      Result Value    Glucose, Bld 149 (*)    BUN 54 (*)    Creatinine, Ser 3.30 (*)    GFR, Estimated 13 (*)    All other components within normal limits  CBC - Abnormal; Notable for the following components:   WBC 10.6 (*)    All other components within normal limits  HEPATIC FUNCTION PANEL - Abnormal; Notable for the following components:   Total Bilirubin 1.3 (*)    Bilirubin, Direct 0.5 (*)    All other components within normal limits  BRAIN NATRIURETIC PEPTIDE  URINALYSIS, ROUTINE W REFLEX MICROSCOPIC     EKG  My interpretation of EKG:  Normal sinus rate of 75, no st elevation, no twi normal intervals   RADIOLOGY I have reviewed the xray personally and interpreted no hip fx    PROCEDURES:  Critical Care performed: No  Procedures   MEDICATIONS ORDERED IN ED: Medications  sodium chloride   0.9 % bolus 500 mL (has no administration in time range)     IMPRESSION / MDM / ASSESSMENT AND PLAN / ED COURSE  I reviewed the triage vital signs and the nursing notes.   Patient's presentation is most consistent with acute presentation with potential threat to life or bodily function.   Patient comes in with new weakness.  Workup was done evaluate for Electra abnormalities, AKI, will add on EKG, troponin to evaluate for ACS.  She denies any falls hitting her head no evidence of trauma on examination.  She does not really seem to have any new chest wall pain or pelvis pain but will screen with x-rays.  Pt comes in with new weakness. Normal ef on echo 02/26/2022   Xray negative Troponin slightly elevated but could be related to AKI so we will order a second 1 she denied any active chest pain.  Her BMP does show creatinine of 3.3 which is up from her baseline with elevated BUN.  CBC does show elevated white count.  I did do a bedside ultrasound did not see any signs of retention.  She does report not really urinating and she reports that her legs are at baseline swelling.  Her BNP is normal therefore  we will do a gentle fluid bolus to see if this helps improve symptoms given this could be from dehydration given she reports no urination since yesterday  The patient is on the cardiac monitor to evaluate for evidence of arrhythmia and/or significant heart rate changes.      FINAL CLINICAL IMPRESSION(S) / ED DIAGNOSES   Final diagnoses:  Fall, initial encounter  Weakness  AKI (acute kidney injury) (HCC)     Rx / DC Orders   ED Discharge Orders     None        Note:  This document was prepared using Dragon voice recognition software and may include unintentional dictation errors.   Ernest Ronal BRAVO, MD 02/14/24 [phone]

## 2024-02-13 NOTE — ED Notes (Addendum)
 Phone call to [facility] regarding the cane that pt stated she came in with on the truck but is not in the room.  They will look, contact the truck and call me back with update.  Update:  EMS called back and stated the truck said they left her cane at home in the dresser drawer. Advised pt  Advised pt what EMS said and pt said, No the didn't. I keep that walking cane with me all the time and I brought that with me.

## 2024-02-13 NOTE — ED Triage Notes (Signed)
 Pt arrives via EMS from home; st this am pt slid out of chair earlier today and was assisted back into chair by EMS; since pt has been unable to get up

## 2024-02-14 ENCOUNTER — Observation Stay

## 2024-02-14 ENCOUNTER — Observation Stay (HOSPITAL_COMMUNITY)
Admit: 2024-02-14 | Discharge: 2024-02-14 | Disposition: A | Discharge status: Home / Self Care | Attending: Family Medicine | Admitting: Family Medicine

## 2024-02-14 ENCOUNTER — Inpatient Hospital Stay

## 2024-02-14 DIAGNOSIS — R29707 NIHSS score 7: Secondary | ICD-10-CM

## 2024-02-14 DIAGNOSIS — Z7984 Long term (current) use of oral hypoglycemic drugs: Secondary | ICD-10-CM | POA: Diagnosis not present

## 2024-02-14 DIAGNOSIS — I10 Essential (primary) hypertension: Secondary | ICD-10-CM | POA: Diagnosis present

## 2024-02-14 DIAGNOSIS — N179 Acute kidney failure, unspecified: Secondary | ICD-10-CM | POA: Diagnosis not present

## 2024-02-14 DIAGNOSIS — E785 Hyperlipidemia, unspecified: Secondary | ICD-10-CM | POA: Insufficient documentation

## 2024-02-14 DIAGNOSIS — R531 Weakness: Secondary | ICD-10-CM | POA: Diagnosis present

## 2024-02-14 DIAGNOSIS — W07XXXA Fall from chair, initial encounter: Secondary | ICD-10-CM | POA: Diagnosis present

## 2024-02-14 DIAGNOSIS — I639 Cerebral infarction, unspecified: Secondary | ICD-10-CM

## 2024-02-14 DIAGNOSIS — N170 Acute kidney failure with tubular necrosis: Secondary | ICD-10-CM | POA: Diagnosis present

## 2024-02-14 DIAGNOSIS — W19XXXA Unspecified fall, initial encounter: Secondary | ICD-10-CM

## 2024-02-14 DIAGNOSIS — I739 Peripheral vascular disease, unspecified: Secondary | ICD-10-CM

## 2024-02-14 DIAGNOSIS — Y92008 Other place in unspecified non-institutional (private) residence as the place of occurrence of the external cause: Secondary | ICD-10-CM | POA: Diagnosis not present

## 2024-02-14 DIAGNOSIS — Z8673 Personal history of transient ischemic attack (TIA), and cerebral infarction without residual deficits: Secondary | ICD-10-CM | POA: Diagnosis not present

## 2024-02-14 DIAGNOSIS — I6529 Occlusion and stenosis of unspecified carotid artery: Secondary | ICD-10-CM

## 2024-02-14 DIAGNOSIS — Z8249 Family history of ischemic heart disease and other diseases of the circulatory system: Secondary | ICD-10-CM | POA: Diagnosis not present

## 2024-02-14 DIAGNOSIS — I2489 Other forms of acute ischemic heart disease: Secondary | ICD-10-CM | POA: Diagnosis present

## 2024-02-14 DIAGNOSIS — I480 Paroxysmal atrial fibrillation: Secondary | ICD-10-CM | POA: Diagnosis present

## 2024-02-14 DIAGNOSIS — Z7901 Long term (current) use of anticoagulants: Secondary | ICD-10-CM | POA: Diagnosis not present

## 2024-02-14 DIAGNOSIS — E876 Hypokalemia: Secondary | ICD-10-CM | POA: Diagnosis present

## 2024-02-14 DIAGNOSIS — R9431 Abnormal electrocardiogram [ECG] [EKG]: Secondary | ICD-10-CM | POA: Diagnosis not present

## 2024-02-14 DIAGNOSIS — I6340 Cerebral infarction due to embolism of unspecified cerebral artery: Secondary | ICD-10-CM | POA: Diagnosis not present

## 2024-02-14 DIAGNOSIS — G8194 Hemiplegia, unspecified affecting left nondominant side: Secondary | ICD-10-CM | POA: Diagnosis present

## 2024-02-14 DIAGNOSIS — I2510 Atherosclerotic heart disease of native coronary artery without angina pectoris: Secondary | ICD-10-CM | POA: Diagnosis present

## 2024-02-14 DIAGNOSIS — E1151 Type 2 diabetes mellitus with diabetic peripheral angiopathy without gangrene: Secondary | ICD-10-CM | POA: Diagnosis present

## 2024-02-14 DIAGNOSIS — R7989 Other specified abnormal findings of blood chemistry: Secondary | ICD-10-CM

## 2024-02-14 DIAGNOSIS — M6282 Rhabdomyolysis: Secondary | ICD-10-CM | POA: Diagnosis present

## 2024-02-14 DIAGNOSIS — I160 Hypertensive urgency: Secondary | ICD-10-CM | POA: Diagnosis not present

## 2024-02-14 DIAGNOSIS — M4802 Spinal stenosis, cervical region: Secondary | ICD-10-CM | POA: Diagnosis present

## 2024-02-14 DIAGNOSIS — Z79899 Other long term (current) drug therapy: Secondary | ICD-10-CM | POA: Diagnosis not present

## 2024-02-14 DIAGNOSIS — I6389 Other cerebral infarction: Secondary | ICD-10-CM | POA: Diagnosis present

## 2024-02-14 DIAGNOSIS — I6522 Occlusion and stenosis of left carotid artery: Secondary | ICD-10-CM | POA: Diagnosis present

## 2024-02-14 DIAGNOSIS — Z794 Long term (current) use of insulin: Secondary | ICD-10-CM | POA: Diagnosis not present

## 2024-02-14 DIAGNOSIS — Z7982 Long term (current) use of aspirin: Secondary | ICD-10-CM | POA: Diagnosis not present

## 2024-02-14 DIAGNOSIS — N200 Calculus of kidney: Secondary | ICD-10-CM | POA: Diagnosis present

## 2024-02-14 DIAGNOSIS — Z886 Allergy status to analgesic agent status: Secondary | ICD-10-CM | POA: Diagnosis not present

## 2024-02-14 DIAGNOSIS — I4891 Unspecified atrial fibrillation: Secondary | ICD-10-CM

## 2024-02-14 DIAGNOSIS — E119 Type 2 diabetes mellitus without complications: Secondary | ICD-10-CM

## 2024-02-14 LAB — CBC
HCT: 46 % (ref 36.0–46.0)
HCT: 47.2 % — ABNORMAL HIGH (ref 36.0–46.0)
Hemoglobin: 15.4 g/dL — ABNORMAL HIGH (ref 12.0–15.0)
Hemoglobin: 15.6 g/dL — ABNORMAL HIGH (ref 12.0–15.0)
MCH: 30 pg (ref 26.0–34.0)
MCH: 30.1 pg (ref 26.0–34.0)
MCHC: 33.5 g/dL (ref 30.0–36.0)
MCHC: 33.1 g/dL (ref 30.0–36.0)
MCV: 89.7 fL (ref 80.0–100.0)
MCV: 90.9 fL (ref 80.0–100.0)
Platelets: 212 K/uL (ref 150–400)
Platelets: 206 K/uL (ref 150–400)
RBC: 5.13 MIL/uL — ABNORMAL HIGH (ref 3.87–5.11)
RBC: 5.19 MIL/uL — ABNORMAL HIGH (ref 3.87–5.11)
RDW: 13.5 % (ref 11.5–15.5)
RDW: 13.3 % (ref 11.5–15.5)
WBC: 11.3 K/uL — ABNORMAL HIGH (ref 4.0–10.5)
WBC: 10.5 K/uL (ref 4.0–10.5)
nRBC: 0 % (ref 0.0–0.2)
nRBC: 0 % (ref 0.0–0.2)

## 2024-02-14 LAB — ECHOCARDIOGRAM COMPLETE
AR max vel: 2.11 cm2
AV Area VTI: 2.06 cm2
AV Area mean vel: 1.9 cm2
AV Mean grad: 1 mmHg
AV Peak grad: 2.6 mmHg
Ao pk vel: 0.81 m/s
Area-P 1/2: 3.39 cm2
Height: 67 in
MV VTI: 1.23 cm2
S' Lateral: 3.4 cm
Weight: 3400.38 [oz_av]

## 2024-02-14 LAB — CK: Total CK: 729 U/L — ABNORMAL HIGH (ref 38–234)

## 2024-02-14 LAB — URINALYSIS, ROUTINE W REFLEX MICROSCOPIC
Bilirubin Urine: NEGATIVE
Glucose, UA: NEGATIVE mg/dL
Ketones, ur: NEGATIVE mg/dL
Nitrite: NEGATIVE
Protein, ur: NEGATIVE mg/dL
Specific Gravity, Urine: 1.009 (ref 1.005–1.030)
pH: 5 (ref 5.0–8.0)

## 2024-02-14 LAB — HEMOGLOBIN A1C
Hgb A1c MFr Bld: 6 % — ABNORMAL HIGH (ref 4.8–5.6)
Mean Plasma Glucose: 125.5 mg/dL

## 2024-02-14 LAB — BASIC METABOLIC PANEL WITH GFR
Anion gap: 15 (ref 5–15)
BUN: 49 mg/dL — ABNORMAL HIGH (ref 8–23)
CO2: 25 mmol/L (ref 22–32)
Calcium: 9.3 mg/dL (ref 8.9–10.3)
Chloride: 100 mmol/L (ref 98–111)
Creatinine, Ser: 2.51 mg/dL — ABNORMAL HIGH (ref 0.44–1.00)
GFR, Estimated: 18 mL/min — ABNORMAL LOW (ref >60)
Glucose, Bld: 136 mg/dL — ABNORMAL HIGH (ref 70–99)
Potassium: 3.3 mmol/L — ABNORMAL LOW (ref 3.5–5.1)
Sodium: 140 mmol/L (ref 135–145)

## 2024-02-14 LAB — APTT: aPTT: 26 s (ref 24–36)

## 2024-02-14 LAB — CREATININE, SERUM
Creatinine, Ser: 2.85 mg/dL — ABNORMAL HIGH (ref 0.44–1.00)
GFR, Estimated: 16 mL/min — ABNORMAL LOW (ref >60)

## 2024-02-14 LAB — TSH: TSH: 1.117 u[IU]/mL (ref 0.350–4.500)

## 2024-02-14 LAB — LIPID PANEL
Cholesterol: 162 mg/dL (ref 0–200)
HDL: 42 mg/dL (ref >40)
LDL Cholesterol: 107 mg/dL — ABNORMAL HIGH (ref 0–99)
Total CHOL/HDL Ratio: 3.9 ratio
Triglycerides: 66 mg/dL (ref <150)
VLDL: 13 mg/dL (ref 0–40)

## 2024-02-14 LAB — TROPONIN I (HIGH SENSITIVITY)
Troponin I (High Sensitivity): 39 ng/L — ABNORMAL HIGH (ref <18)
Troponin I (High Sensitivity): 52 ng/L — ABNORMAL HIGH (ref <18)

## 2024-02-14 LAB — PROTIME-INR
INR: 1.3 — ABNORMAL HIGH (ref 0.8–1.2)
Prothrombin Time: 16.5 s — ABNORMAL HIGH (ref 11.4–15.2)

## 2024-02-14 LAB — CBG MONITORING, ED: Glucose-Capillary: 151 mg/dL — ABNORMAL HIGH (ref 70–99)

## 2024-02-14 LAB — MAGNESIUM: Magnesium: 1.7 mg/dL (ref 1.7–2.4)

## 2024-02-14 LAB — GLUCOSE, CAPILLARY: Glucose-Capillary: 110 mg/dL — ABNORMAL HIGH (ref 70–99)

## 2024-02-14 MED ORDER — INSULIN ASPART 100 UNIT/ML IJ SOLN
0.0000 [IU] | Freq: Three times a day (TID) | INTRAMUSCULAR | Status: DC
  Administered 2024-02-15: 5 [IU] via SUBCUTANEOUS
  Filled 2024-02-14: qty 1

## 2024-02-14 MED ORDER — ENOXAPARIN SODIUM 30 MG/0.3ML IJ SOSY: 30.0000 mg | PREFILLED_SYRINGE | INTRAMUSCULAR | Status: DC

## 2024-02-14 MED ORDER — NICARDIPINE HCL IN NACL 20-0.86 MG/200ML-% IV SOLN: 3.0000 mg/h | INTRAVENOUS | Status: DC

## 2024-02-14 MED ORDER — HYDRALAZINE HCL 20 MG/ML IJ SOLN
10.0000 mg | Freq: Four times a day (QID) | INTRAMUSCULAR | Status: DC | PRN
  Administered 2024-02-14 – 2024-02-18 (×2): 10 mg via INTRAVENOUS
  Filled 2024-02-14 (×2): qty 1

## 2024-02-14 MED ORDER — ATORVASTATIN CALCIUM 20 MG PO TABS
40.0000 mg | ORAL_TABLET | Freq: Every day | ORAL | Status: DC
Start: 2024-02-14 — End: 2024-02-20
  Administered 2024-02-14 – 2024-02-19 (×6): 40 mg via ORAL
  Filled 2024-02-14 (×6): qty 2

## 2024-02-14 MED ORDER — METOPROLOL TARTRATE 5 MG/5ML IV SOLN
5.0000 mg | Freq: Once | INTRAVENOUS | Status: AC
  Administered 2024-02-14: 5 mg via INTRAVENOUS
  Filled 2024-02-14: qty 5

## 2024-02-14 MED ORDER — ONDANSETRON HCL 4 MG PO TABS: 4.0000 mg | ORAL_TABLET | Freq: Four times a day (QID) | ORAL | Status: DC | PRN

## 2024-02-14 MED ORDER — SODIUM CHLORIDE 0.9 % IV SOLN: INTRAVENOUS | Status: AC

## 2024-02-14 MED ORDER — LATANOPROST 0.005 % OP SOLN
1.0000 [drp] | Freq: Every day | OPHTHALMIC | Status: DC
  Administered 2024-02-15 – 2024-02-19 (×6): 1 [drp] via OPHTHALMIC
  Filled 2024-02-14 (×2): qty 2.5

## 2024-02-14 MED ORDER — ENOXAPARIN SODIUM 40 MG/0.4ML IJ SOSY: 40.0000 mg | PREFILLED_SYRINGE | INTRAMUSCULAR | Status: DC

## 2024-02-14 MED ORDER — ACETAMINOPHEN 650 MG RE SUPP: 650.0000 mg | Freq: Four times a day (QID) | RECTAL | Status: DC | PRN

## 2024-02-14 MED ORDER — HYDROCODONE-ACETAMINOPHEN 5-325 MG PO TABS: 1.0000 | ORAL_TABLET | Freq: Four times a day (QID) | ORAL | Status: DC | PRN

## 2024-02-14 MED ORDER — TRAZODONE HCL 50 MG PO TABS: 25.0000 mg | ORAL_TABLET | Freq: Every evening | ORAL | Status: DC | PRN

## 2024-02-14 MED ORDER — CLONIDINE HCL 0.1 MG PO TABS
0.3000 mg | ORAL_TABLET | Freq: Two times a day (BID) | ORAL | Status: DC
Start: 2024-02-14 — End: 2024-02-15
  Administered 2024-02-14 – 2024-02-15 (×3): 0.3 mg via ORAL
  Filled 2024-02-14 (×3): qty 3

## 2024-02-14 MED ORDER — HEPARIN BOLUS VIA INFUSION
3900.0000 [IU] | Freq: Once | INTRAVENOUS | Status: AC
  Administered 2024-02-14: 3900 [IU] via INTRAVENOUS
  Filled 2024-02-14: qty 3900

## 2024-02-14 MED ORDER — ONDANSETRON HCL 4 MG/2ML IJ SOLN: 4.0000 mg | Freq: Four times a day (QID) | INTRAMUSCULAR | Status: DC | PRN

## 2024-02-14 MED ORDER — ACETAMINOPHEN 325 MG PO TABS
650.0000 mg | ORAL_TABLET | Freq: Four times a day (QID) | ORAL | Status: DC | PRN
  Administered 2024-02-18 – 2024-02-19 (×3): 650 mg via ORAL
  Filled 2024-02-14 (×3): qty 2

## 2024-02-14 MED ORDER — MAGNESIUM HYDROXIDE 400 MG/5ML PO SUSP: 30.0000 mL | Freq: Every day | ORAL | Status: DC | PRN

## 2024-02-14 MED ORDER — SODIUM CHLORIDE 0.9 % IV BOLUS
500.0000 mL | Freq: Once | INTRAVENOUS | Status: AC
  Administered 2024-02-14: 500 mL via INTRAVENOUS

## 2024-02-14 MED ORDER — OYSTER SHELL CALCIUM/D3 500-5 MG-MCG PO TABS
1.0000 | ORAL_TABLET | Freq: Every day | ORAL | Status: DC
  Administered 2024-02-14 – 2024-02-18 (×5): 1 via ORAL
  Filled 2024-02-14 (×5): qty 1

## 2024-02-14 MED ORDER — AMLODIPINE BESYLATE 10 MG PO TABS
10.0000 mg | ORAL_TABLET | Freq: Every day | ORAL | Status: DC
  Administered 2024-02-14 – 2024-02-15 (×2): 10 mg via ORAL
  Filled 2024-02-14: qty 1
  Filled 2024-02-14: qty 2

## 2024-02-14 MED ORDER — METOPROLOL TARTRATE 50 MG PO TABS
50.0000 mg | ORAL_TABLET | Freq: Two times a day (BID) | ORAL | Status: DC
  Administered 2024-02-14 – 2024-02-19 (×12): 50 mg via ORAL
  Filled 2024-02-14: qty 2
  Filled 2024-02-14 (×11): qty 1

## 2024-02-14 MED ORDER — HEPARIN (PORCINE) 25000 UT/250ML-% IV SOLN
1100.0000 [IU]/h | INTRAVENOUS | Status: DC
  Administered 2024-02-14 – 2024-02-16 (×3): 1100 [IU]/h via INTRAVENOUS
  Filled 2024-02-14 (×3): qty 250

## 2024-02-14 MED ORDER — ASPIRIN 81 MG PO TBEC
81.0000 mg | DELAYED_RELEASE_TABLET | Freq: Every day | ORAL | Status: DC
Start: 2024-02-14 — End: 2024-02-20
  Administered 2024-02-14 – 2024-02-19 (×6): 81 mg via ORAL
  Filled 2024-02-14 (×6): qty 1

## 2024-02-14 MED ORDER — LABETALOL HCL 5 MG/ML IV SOLN: 20.0000 mg | INTRAVENOUS | Status: DC | PRN

## 2024-02-14 NOTE — ED Notes (Signed)
 Patient transported to MRI

## 2024-02-14 NOTE — ED Notes (Signed)
 Pharmacy called for IV infiltrate. No recommendations at this time as it was just NS.

## 2024-02-14 NOTE — ED Notes (Signed)
 Gave F2F report to [facility].  Advised Pt that new IV had to be established in left upper arm by IV team.  Also, US  is currently in with patient.  Patient is also on bedpan and has urinated.  Will be sending UA and other tubes to lab, giving the meds that are due and then will bring her to her new room.

## 2024-02-14 NOTE — Evaluation (Signed)
 Physical Therapy Evaluation Patient Details Name: MAYANA IRIGOYEN MRN: [id number] DOB: 05/04/1935 Today's Date: 02/14/2024  History of Present Illness  JIMESHA RISING is a 89 y.o. African-American female with medical history significant for type 2 diabetes mellitus, hypertension, dyslipidemia and chron's disease as well as carotid artery stenosis, who presented to the emergency room with acute onset of fall.  Clinical Impression  Pt is a pleasant 89 year old female who was admitted after falling in her home. Prior to hospitalization, pt amb with SPC and was independent with ADLs. Pt now requires MAX A x 2 for supine<>sit for LE and trunk management. Pt unable to perform SLR while in bed. Pt demonstrates heavy posterior lean initially when sitting at EOB, however improved to CGA with prolonged sitting. Pt demonstrates deficits with strength/balance/activity tolerance. Would benefit from skilled PT to address above deficits and promote optimal return to PLOF.          If plan is discharge home, recommend the following: Two people to help with walking and/or transfers;Two people to help with bathing/dressing/bathroom;Assistance with cooking/housework;Assist for transportation   Can travel by private vehicle   No    Equipment Recommendations None recommended by PT  Recommendations for Other Services       Functional Status Assessment Patient has had a recent decline in their functional status and demonstrates the ability to make significant improvements in function in a reasonable and predictable amount of time.     Precautions / Restrictions Precautions Precautions: Fall Recall of Precautions/Restrictions: Intact Restrictions Weight Bearing Restrictions Per Provider Order: No      Mobility  Bed Mobility Overal bed mobility: Needs Assistance Bed Mobility: Supine to Sit, Sit to Supine     Supine to sit: Max assist, +2 for physical assistance Sit to supine: Max assist, +2 for  physical assistance   General bed mobility comments: Able to reach towards bed rails to assist with supine<>sit. Assistance for trunk and LE management.    Transfers Overall transfer level: Needs assistance                 General transfer comment: NT d/t safety    Ambulation/Gait               General Gait Details: NT d/t safety  Stairs            Wheelchair Mobility     Tilt Bed    Modified Rankin (Stroke Patients Only)       Balance Overall balance assessment: Needs assistance Sitting-balance support: Bilateral upper extremity supported, Feet supported Sitting balance-Leahy Scale: Poor Sitting balance - Comments: Heavy posterior lean that improved with prolonged sitting from max down to CGA. Postural control: Posterior lean     Standing balance comment: NT. Pt has poor sitting balance. Unable to stand at this time                             Pertinent Vitals/Pain Pain Assessment Pain Assessment: No/denies pain    Home Living Family/patient expects to be discharged to:: Private residence Living Arrangements: Children Available Help at Discharge: Family;Available 24 hours/day Type of Home: House Home Access: Level entry       Home Layout: One level Home Equipment: Cane - single Librarian, academic (2 wheels)      Prior Function Prior Level of Function : Independent/Modified Independent;History of Falls (last six months)  Mobility Comments: uses cane at baseline, fell prior to arrival. Gilmer is in rehab currently. ADLs Comments: Independent with ADLs at baseline     Extremity/Trunk Assessment   Upper Extremity Assessment Upper Extremity Assessment: Generalized weakness    Lower Extremity Assessment Lower Extremity Assessment: Generalized weakness    Cervical / Trunk Assessment Cervical / Trunk Assessment: Kyphotic  Communication   Communication Communication: No apparent difficulties     Cognition Arousal: Alert Behavior During Therapy: WFL for tasks assessed/performed   PT - Cognitive impairments: No apparent impairments                       PT - Cognition Comments: pleasant and agreeable to PT session Following commands: Intact       Cueing Cueing Techniques: Verbal cues, Tactile cues, Visual cues     General Comments      Exercises     Assessment/Plan    PT Assessment Patient needs continued PT services  PT Problem List Decreased strength;Decreased activity tolerance;Decreased balance;Decreased mobility;Decreased knowledge of use of DME       PT Treatment Interventions DME instruction;Gait training;Functional mobility training;Therapeutic activities;Therapeutic exercise;Balance training;Neuromuscular re-education;Patient/family education    PT Goals (Current goals can be found in the Care Plan section)  Acute Rehab PT Goals Patient Stated Goal: to get moving PT Goal Formulation: With patient Time For Goal Achievement: 02/28/24 Potential to Achieve Goals: Fair    Frequency Min 2X/week     Co-evaluation               AM-PAC PT 6 Clicks Mobility  Outcome Measure Help needed turning from your back to your side while in a flat bed without using bedrails?: A Lot Help needed moving from lying on your back to sitting on the side of a flat bed without using bedrails?: A Lot Help needed moving to and from a bed to a chair (including a wheelchair)?: A Lot Help needed standing up from a chair using your arms (e.g., wheelchair or bedside chair)?: A Lot Help needed to walk in hospital room?: A Lot Help needed climbing 3-5 steps with a railing? : Total 6 Click Score: 11    End of Session   Activity Tolerance: Patient tolerated treatment well Patient left: in bed;with call bell/phone within reach;with bed alarm set Nurse Communication: Mobility status PT Visit Diagnosis: Unsteadiness on feet (R26.81);Muscle weakness (generalized)  (M62.81);History of falling (Z91.81);Difficulty in walking, not elsewhere classified (R26.2)    Time: 8464-8442 PT Time Calculation (min) (ACUTE ONLY): 22 min   Charges:                 Indra Wolters, SPT   Jenniffer Vessels 02/14/2024, 4:12 PM

## 2024-02-14 NOTE — Assessment & Plan Note (Signed)
-   Will place the patient on as needed IV hydralazine  and continue antihypertensive therapy while holding off nephrotoxins.

## 2024-02-14 NOTE — Progress Notes (Signed)
 PROGRESS NOTE    Kaitlin Gomez  FMW:[id number] DOB: 10/10/1934 DOA: 02/13/2024 PCP: [place] HERO, MD  Chief Complaint  Patient presents with   [facility] Course:  Kaitlin Gomez is an 89 year old female with prior CVA, CAD, type 2 diabetes, hypertension, dyslipidemia, carotid disease, known coronary artery stenosis, who presents to the emergency department in which she woke up and felt weak in both legs.  She reports she tried to walk but was unable to do so. On arrival to ED BP was 145/59, heart rate 56, creatinine 3.3, CKD 729.  High-sensitivity troponin 39 with repeat at 52.  EKG revealed NSR, QTc 49, rate 75.  CXR and pelvic x-ray without acute process.  Renal ultrasound revealed changes consistent with medical renal disease and a 10 mm obstructing left renal stone with no other focal abnormalities.  She was given IV fluid bolus and admitted for management.  Subjective: Patient's blood pressure was elevated overnight up to 219/200.  On arrival this morning I initially ordered for Cardene  drip but when repeating blood pressure with a new blood pressure cuff blood pressure immediately normalized to 132/63.  Cardene  drip was discontinued.  Patient did have A-fib RVR this morning with rate up to 140.  She received 5 mg IV metoprolol  x 1 with immediate resolution in heart rate.  She does remain in intermittent A-fib.  She denies any chest pain this morning.  She continues to endorse weakness in her lower extremities.  Objective: Vitals:   02/14/24 0400 02/14/24 0730 02/14/24 0945 02/14/24 1014  BP: (!) 195/109 (!) 189/79 132/63   Pulse: 76  82   Resp: (!) 24  17   Temp:    98.2 F (36.8 C)  TempSrc:    Oral  SpO2: 96% 100% 100%   Weight:      Height:        Intake/Output Summary (Last 24 hours) at 02/14/2024 1347 Last data filed at 02/14/2024 0736 Gross per 24 hour  Intake 500 ml  Output 650 ml  Net -150 ml   Filed Weights   02/13/24 2018 02/14/24 0151  Weight: 79.4 kg  96.4 kg    Examination: General exam: Appears calm and comfortable, NAD  Respiratory system: No work of breathing, symmetric chest wall expansion Cardiovascular system: S1 & S2 heard, RRR.  Gastrointestinal system: Abdomen is nondistended, soft and nontender.  Neuro: 4 out of 5 weakness left upper extremity, 5 out of 5 right upper extremity.  Unable to raise legs off bed, left leg slightly weaker than right. Skin: No rashes, lesions Psychiatry: Demonstrates appropriate judgement and insight. Mood & affect appropriate for situation.   Assessment & Plan:  Principal Problem:   AKI (acute kidney injury) (HCC) Active Problems:   Hypertensive urgency   Fall at home, initial encounter   Dyslipidemia   Type 2 diabetes mellitus without complications (HCC)    Acute posterior CVA - Left-sided weakness - Brain MRI ordered: Acute infarct in posterior limb of right internal capsule.  Patient is 24 hours out from event. - Echocardiogram: EF 50 to 55%, hypokinesis of the basal inferior wall with moderate left ventricular hypertrophy.  Diastolic parameters indeterminate. - Will obtain lipid panel and hemoglobin A1c for risk stratification - Continue telemetry - Consult neurology - PT/OT consults  History of CVA - 2023 patient was diagnosed with bilateral small embolic infarcts thought to be cardioembolic though atrial fibrillation was not diagnosed at that time.  She was determined not  to be a candidate for anticoagulation due to recent GI bleed and anemia.  She did receive loop recorder during that time. - Continue with aspirin  and statin for now.  Cardiac consult as above.  New onset atrial fibrillation, with RVR - Patient denies history of this.  Did have cardiac monitor in the past and there was concern of possible A-fib in the past from likely cardioembolic CVA 2023 - Did have A-fib RVR this morning which immediately resolved with metoprolol  5 mg IVP - Continue to monitor closely on  telemetry - Obtain TSH - Echo with preserved EF but hypokinesis of the basal anterior wall with moderate LVH. - Will consult cardiology.  Patient may benefit from anticoagulation and possible ischemic workup - Has been on prophylactic Lovenox .  Will switch to therapeutic dosing for now  Hypertensive urgency ruled out - Blood pressure consistently elevated overnight up to 219/200.  Upon direct evaluation with bedside RN Olam today we repositioned blood pressure cuff and blood pressure was normal.  Initially Cardene  drip was ordered but this was discontinued before it began.  Appears blood pressure readings were inaccurate.  Hypertension - Continue home meds  AKI - Baseline creatinine appears to be 0.88 from 1 year ago.  Creatinine 3.3 on arrival with elevated BUN. - Likely prerenal given poor p.o. intake and downtime.  Also likely impacted by mild rhabdomyolysis and in perhaps hypotension at home given A-fib RVR - Continue IV fluids - Continue to monitor CMP - Avoid nephrotoxic medications - Creatinine clearance of 18 when needed  Rhabdomyolysis - CK 729, will repeat in AM.  Likely secondary to prolonged downtime.  No trauma.  10 mm nonobstructing left renal stone - Aware.  No flank pain currently.  Stone is nonobstructive.  She is currently on IV fluids, will continue.  Monitor creatinine.  Type 2 diabetes - Hemoglobin A1c ordered - Continue sliding scale insulin  for now  Coronary artery stenosis, left - Asymptomatic 60 to 79% left carotid stenosis.  On statin and aspirin  at home. - Previously followed with vascular in the clinic, was recommended to follow-up with carotid duplex scans in the future. No definite plans for CEA   DVT prophylaxis: Lovenox    Code Status: Full Code Disposition:  Inpatient pending work up and clinical resolution in AKI  Consultants:    Procedures:    Antimicrobials:  Anti-infectives (From admission, onward)    None       Data Reviewed: I  have personally reviewed following labs and imaging studies CBC: Recent Labs  Lab 02/13/24 2019 02/14/24 0124 02/14/24 0555  WBC 10.6* 11.3* 10.5  HGB 14.4 15.4* 15.6*  HCT 43.7 46.0 47.2*  MCV 92.4 89.7 90.9  PLT 223 212 206   Basic Metabolic Panel: Recent Labs  Lab 02/13/24 2019 02/14/24 0124 02/14/24 0555  NA 139  --  140  K 3.9  --  3.3*  CL 102  --  100  CO2 23  --  25  GLUCOSE 149*  --  136*  BUN 54*  --  49*  CREATININE 3.30* 2.85* 2.51*  CALCIUM  9.3  --  9.3   GFR: Estimated Creatinine Clearance: 18.8 mL/min (A) (by C-G formula based on SCr of 2.51 mg/dL (H)). Liver Function Tests: Recent Labs  Lab 02/13/24 2019  AST 30  ALT 21  ALKPHOS 58  BILITOT 1.3*  PROT 7.2  ALBUMIN 3.5   CBG: No results for input(s): GLUCAP in the last 168 hours.  No results found for  this or any previous visit (from the past 240 hours).   Radiology Studies: ECHOCARDIOGRAM COMPLETE Result Date: 02/14/2024    ECHOCARDIOGRAM REPORT   Patient Name:   Kaitlin Gomez Date of Exam: 02/14/2024 Medical Rec #:  [id number]      Height:       67.0 in Accession #:    [id number]     Weight:       212.5 lb Date of Birth:  [birth year]      BSA:          2.075 m Patient Age:    89 years       BP:           132/63 mmHg Patient Gender: F              HR:           58 bpm. Exam Location:  [facility] Procedure: 2D Echo, Cardiac Doppler and Color Doppler (Both Spectral and Color            Flow Doppler were utilized during procedure). STAT ECHO Indications:     Abnormal ECG R94.31  History:         Patient has prior history of Echocardiogram examinations, most                  recent 02/26/2022. Arrythmias:Atrial Fibrillation.  Sonographer:     Ashley McNeely-Sloane Referring Phys:  [id number] Pier Bosher Diagnosing Phys: Timothy Gollan MD IMPRESSIONS  1. Left ventricular ejection fraction, by estimation, is 50 to 55%. The left ventricle has low normal function. The left ventricle demonstrates regional wall motion  abnormalities (hypokinesis of the basal inferior wall). There is moderate left ventricular hypertrophy. Left ventricular diastolic parameters are indeterminate.  2. Right ventricular systolic function is normal. The right ventricular size is normal. Tricuspid regurgitation signal is inadequate for assessing PA pressure.  3. Left atrial size was mildly dilated.  4. The mitral valve is normal in structure. Mild mitral valve regurgitation. No evidence of mitral stenosis.  5. The aortic valve has an indeterminant number of cusps. Aortic valve regurgitation is not visualized. No aortic stenosis is present.  6. The inferior vena cava is normal in size with greater than 50% respiratory variability, suggesting right atrial pressure of 3 mmHg. FINDINGS  Left Ventricle: Left ventricular ejection fraction, by estimation, is 50 to 55%. The left ventricle has low normal function. The left ventricle demonstrates regional wall motion abnormalities. Strain was performed and the global longitudinal strain is indeterminate. The left ventricular internal cavity size was normal in size. There is moderate left ventricular hypertrophy. Left ventricular diastolic parameters are indeterminate. Right Ventricle: The right ventricular size is normal. No increase in right ventricular wall thickness. Right ventricular systolic function is normal. Tricuspid regurgitation signal is inadequate for assessing PA pressure. Left Atrium: Left atrial size was mildly dilated. Right Atrium: Right atrial size was normal in size. Pericardium: There is no evidence of pericardial effusion. Mitral Valve: The mitral valve is normal in structure. Mild mitral annular calcification. Mild mitral valve regurgitation. No evidence of mitral valve stenosis. MV peak gradient, 4.4 mmHg. The mean mitral valve gradient is 1.0 mmHg. Tricuspid Valve: The tricuspid valve is normal in structure. Tricuspid valve regurgitation is mild . No evidence of tricuspid stenosis. Aortic  Valve: The aortic valve has an indeterminant number of cusps. Aortic valve regurgitation is not visualized. No aortic stenosis is present. Aortic valve mean gradient measures 1.0 mmHg. Aortic valve peak gradient  measures 2.6 mmHg. Aortic valve area, by VTI measures 2.06 cm. Pulmonic Valve: The pulmonic valve was normal in structure. Pulmonic valve regurgitation is not visualized. No evidence of pulmonic stenosis. Aorta: The aortic root is normal in size and structure. Venous: The inferior vena cava is normal in size with greater than 50% respiratory variability, suggesting right atrial pressure of 3 mmHg. IAS/Shunts: No atrial level shunt detected by color flow Doppler. Additional Comments: 3D was performed not requiring image post processing on an independent workstation and was indeterminate.  LEFT VENTRICLE PLAX 2D LVIDd:         3.90 cm   Diastology LVIDs:         3.40 cm   LV e' medial:    5.55 cm/s LV PW:         2.00 cm   LV E/e' medial:  16.1 LV IVS:        1.30 cm   LV e' lateral:   10.10 cm/s LVOT diam:     1.80 cm   LV E/e' lateral: 8.9 LV SV:         29 LV SV Index:   14 LVOT Area:     2.54 cm  RIGHT VENTRICLE RV S prime:     8.70 cm/s TAPSE (M-mode): 1.9 cm LEFT ATRIUM             Index        RIGHT ATRIUM           Index LA diam:        4.40 cm 2.12 cm/m   RA Area:     14.00 cm LA Vol (A2C):   59.7 ml 28.77 ml/m  RA Volume:   27.30 ml  13.16 ml/m LA Vol (A4C):   82.0 ml 39.52 ml/m LA Biplane Vol: 69.9 ml 33.68 ml/m  AORTIC VALVE                    PULMONIC VALVE AV Area (Vmax):    2.11 cm     PV Vmax:        0.96 m/s AV Area (Vmean):   1.90 cm     PV Vmean:       67.600 cm/s AV Area (VTI):     2.06 cm     PV VTI:         0.177 m AV Vmax:           80.60 cm/s   PV Peak grad:   3.7 mmHg AV Vmean:          55.600 cm/s  PV Mean grad:   2.0 mmHg AV VTI:            0.142 m      RVOT Peak grad: 2 mmHg AV Peak Grad:      2.6 mmHg AV Mean Grad:      1.0 mmHg LVOT Vmax:         66.90 cm/s LVOT Vmean:         41.500 cm/s LVOT VTI:          0.115 m LVOT/AV VTI ratio: 0.81  AORTA Ao Root diam: 2.90 cm Ao Asc diam:  2.50 cm MITRAL VALVE MV Area (PHT): 3.39 cm    SHUNTS MV Area VTI:   1.23 cm    Systemic VTI:  0.12 m MV Peak grad:  4.4 mmHg    Systemic Diam: 1.80 cm MV Mean grad:  1.0 mmHg  Pulmonic VTI:  0.120 m MV Vmax:       1.05 m/s MV Vmean:      50.8 cm/s MV Decel Time: 224 msec MV E velocity: 89.60 cm/s Kaitlin Lunger MD Electronically signed by Kaitlin Lunger MD Signature Date/Time: 02/14/2024/1:22:43 PM    Final    MR BRAIN WO CONTRAST Result Date: 02/14/2024 EXAM: MRI BRAIN WITHOUT CONTRAST 02/14/2024 12:35:23 PM TECHNIQUE: Multiplanar multisequence MRI of the head/brain was performed without the administration of intravenous contrast. COMPARISON: MRI brain 03/03/2022. CLINICAL HISTORY: Neuro deficit, acute, stroke suspected; left side weakness, onset >12 hours ago. History of CVA. FINDINGS: BRAIN AND VENTRICLES: Acute infarct in the right posterior limb of the internal capsule. Background of moderate chronic small vessel disease with old infarct along the left middle frontal gyrus and old infarct in the medial aspect of the left cerebellar hemisphere. Temporoparietal predominant volume loss. No intracranial hemorrhage. No mass. No midline shift. No hydrocephalus. The sella is unremarkable. Normal flow voids. ORBITS: No acute abnormality. SINUSES AND MASTOIDS: No acute abnormality. BONES AND SOFT TISSUES: Normal marrow signal. No acute soft tissue abnormality. IMPRESSION: 1. Acute infarct in the posterior limb of the right internal capsule. 2. Background of moderate chronic small vessel disease with old infarcts along the left middle frontal gyrus and medial aspect of the left cerebellar hemisphere. Electronically signed by: Kaitlin Chess MD 02/14/2024 12:40 PM EDT RP Workstation: [id number]   US  Renal Result Date: 02/14/2024 CLINICAL DATA:  Renal failure EXAM: RENAL / URINARY TRACT ULTRASOUND  COMPLETE COMPARISON:  09/13/2018 FINDINGS: Right Kidney: Renal measurements: 10.8 x 5.0 x 4.8 cm. = volume: 135 mL. Simple cyst is noted measuring up to 1.3 cm. No follow-up is recommended. Mild increased echogenicity is noted. No cortical thinning is seen. Left Kidney: Renal measurements: 10.6 x 6.3 x 4.6 cm. = volume: 161 mL. No mass lesion or hydronephrosis is noted. 10 mm echogenic focus is noted consistent with nonobstructing stone. This is stable from the prior CT. Mild increased echogenicity is noted. Bladder: Appears normal for degree of bladder distention. Other: None. IMPRESSION: Changes consistent with medical renal disease. 10 mm nonobstructing left renal stone. No other focal abnormality is noted. Electronically Signed   By: Oneil Devonshire M.D.   On: 02/14/2024 02:13   DG Chest Portable 1 View Result Date: 02/13/2024 CLINICAL DATA:  Fall injury.  Polytrauma. EXAM: PORTABLE CHEST 1 VIEW PORTABLE PELVIS 1 VIEW COMPARISON:  Portable chest 03/03/2022, CTA abdomen and pelvis 09/13/2022. FINDINGS: Chest AP portable 11:02 p.m.: There is overlying material and telemetry leads. The lungs are mildly emphysematous but clear. There is mild chronic elevation of the right hemidiaphragm. The sulci are sharp. Mild cardiomegaly. There is a normal mediastinal configuration. Calcification of the transverse aorta. Thoracic cage is intact. Moderate thoracic spondylosis and bridging enthesopathy. Osteopenia. Portable AP pelvis: No pelvic fracture or diastasis is seen. No other focal bone lesions. Osteopenia. Symmetric moderate to advanced bilateral hip arthrosis with axial and superior joint space loss and slight acetabular protrusio with acetabular and femoral head osteophytes. Patchy calcification both common femoral arteries. Otherwise unremarkable soft tissues. Postsurgical change again noted right lower abdominal quadrant. IMPRESSION: 1. No evidence of acute chest disease.  Stable COPD chest. 2. Mild cardiomegaly. 3.  No evidence of pelvic fracture or diastasis. 4. Osteopenia and degenerative change. 5. Aortic atherosclerosis. 6. Moderate to advanced bilateral hip arthrosis. Electronically Signed   By: Francis Quam M.D.   On: 02/13/2024 23:23   DG Pelvis 1-2 Views Result  Date: 02/13/2024 CLINICAL DATA:  Fall injury.  Polytrauma. EXAM: PORTABLE CHEST 1 VIEW PORTABLE PELVIS 1 VIEW COMPARISON:  Portable chest 03/03/2022, CTA abdomen and pelvis 09/13/2022. FINDINGS: Chest AP portable 11:02 p.m.: There is overlying material and telemetry leads. The lungs are mildly emphysematous but clear. There is mild chronic elevation of the right hemidiaphragm. The sulci are sharp. Mild cardiomegaly. There is a normal mediastinal configuration. Calcification of the transverse aorta. Thoracic cage is intact. Moderate thoracic spondylosis and bridging enthesopathy. Osteopenia. Portable AP pelvis: No pelvic fracture or diastasis is seen. No other focal bone lesions. Osteopenia. Symmetric moderate to advanced bilateral hip arthrosis with axial and superior joint space loss and slight acetabular protrusio with acetabular and femoral head osteophytes. Patchy calcification both common femoral arteries. Otherwise unremarkable soft tissues. Postsurgical change again noted right lower abdominal quadrant. IMPRESSION: 1. No evidence of acute chest disease.  Stable COPD chest. 2. Mild cardiomegaly. 3. No evidence of pelvic fracture or diastasis. 4. Osteopenia and degenerative change. 5. Aortic atherosclerosis. 6. Moderate to advanced bilateral hip arthrosis. Electronically Signed   By: Francis Quam M.D.   On: 02/13/2024 23:23    Scheduled Meds:  amLODipine   10 mg Oral Daily   aspirin  EC  81 mg Oral Daily   calcium -vitamin D   1 tablet Oral Daily   cloNIDine   0.3 mg Oral BID   [START ON 02/15/2024] enoxaparin  (LOVENOX ) injection  30 mg Subcutaneous Q24H   latanoprost   1 drop Both Eyes QHS   metoprolol  tartrate  50 mg Oral BID   Continuous  Infusions:  sodium chloride  100 mL/hr at 02/14/24 0330     LOS: 0 days  MDM: Patient is high risk for one or more organ failure.  They necessitate ongoing hospitalization for continued IV therapies and subsequent lab monitoring. Total time spent interpreting labs and vitals, reviewing the medical record, coordinating care amongst consultants and care team members, directly assessing and discussing care with the patient and/or family: 55 min   Antero Derosia, DO [facility]  To contact the attending physician between 7A-7P please use Epic Chat. To contact the covering physician during after hours 7P-7A, please review Amion.  02/14/2024, 1:47 PM   *This document has been created with the assistance of dictation software. Please excuse typographical errors. *

## 2024-02-14 NOTE — Assessment & Plan Note (Signed)
 Will continue statin therapy

## 2024-02-14 NOTE — Assessment & Plan Note (Signed)
-   Dislike secondary to generalized weakness with associated mild rhabdomyolysis. - The patient will be hydrated with IV normal saline as mentioned above and CK will be followed up. - PT consult will be obtained.

## 2024-02-14 NOTE — ED Provider Notes (Incomplete)
 [facility] Provider Note    Event Date/Time   First MD Initiated Contact with Patient 02/13/24 2217     (approximate)   History   Fall   HPI  Kaitlin Gomez is a 89 y.o. female who comes in with concerns for a fall.  She reports where she slid down onto the ground.  She denies hitting her head or any injuries from it.  She just reports that since this happened this morning she has been feeling really weak and unable to stand up like she typically does.  She denies any chest pain, shortness of breath, abdominal pain or any other concerns.    Physical Exam   Triage Vital Signs: ED Triage Vitals  Encounter Vitals Group     BP 02/13/24 2017 (!) 145/59     Girls Systolic BP Percentile --      Girls Diastolic BP Percentile --      Boys Systolic BP Percentile --      Boys Diastolic BP Percentile --      Pulse Rate 02/13/24 2017 (!) 56     Resp 02/13/24 2017 18     Temp 02/13/24 2017 98.6 F (37 C)     Temp Source 02/13/24 2017 Oral     SpO2 02/13/24 2011 97 %     Weight 02/13/24 2018 175 lb (79.4 kg)     Height 02/13/24 2018 5' 7 (1.702 m)     Head Circumference --      Peak Flow --      Pain Score 02/13/24 2018 0     Pain Loc --      Pain Education --      Exclude from Growth Chart --     Most recent vital signs: Vitals:   02/13/24 2011 02/13/24 2017  BP:  (!) 145/59  Pulse:  (!) 56  Resp:  18  Temp:  98.6 F (37 C)  SpO2: 97% 98%     General: Awake, no distress.  CV:  Good peripheral perfusion.  Resp:  Normal effort.  Abd:  No distention. Soft and non tender  Other:  Edema note, unable to lift up either leg. Good distal pulse.  Denies any pain in legs just feels weak. No trauma to head note    ED Results / Procedures / Treatments   Labs (all labs ordered are listed, but only abnormal results are displayed) Labs Reviewed  BASIC METABOLIC PANEL WITH GFR - Abnormal; Notable for the following components:      Result Value    Glucose, Bld 149 (*)    BUN 54 (*)    Creatinine, Ser 3.30 (*)    GFR, Estimated 13 (*)    All other components within normal limits  CBC - Abnormal; Notable for the following components:   WBC 10.6 (*)    All other components within normal limits  HEPATIC FUNCTION PANEL - Abnormal; Notable for the following components:   Total Bilirubin 1.3 (*)    Bilirubin, Direct 0.5 (*)    All other components within normal limits  BRAIN NATRIURETIC PEPTIDE  URINALYSIS, ROUTINE W REFLEX MICROSCOPIC     EKG  My interpretation of EKG:  Normal sinus rate of 75, no st elevation, no twi normal intervals   RADIOLOGY I have reviewed the xray personally and interpreted no hip fx    PROCEDURES:  Critical Care performed: No  Procedures   MEDICATIONS ORDERED IN ED: Medications - No data  to display   IMPRESSION / MDM / ASSESSMENT AND PLAN / ED COURSE  I reviewed the triage vital signs and the nursing notes.   Patient's presentation is most consistent with acute presentation with potential threat to life or bodily function.   Pt comes in with new weakness. Normal ef on echo 02/26/2022   Xray negative   The patient is on the cardiac monitor to evaluate for evidence of arrhythmia and/or significant heart rate changes.      FINAL CLINICAL IMPRESSION(S) / ED DIAGNOSES   Final diagnoses:  None     Rx / DC Orders   ED Discharge Orders     None        Note:  This document was prepared using Dragon voice recognition software and may include unintentional dictation errors.

## 2024-02-14 NOTE — H&P (Addendum)
 [place]   PATIENT NAME: Kaitlin Gomez    MR#:  [id number]  DATE OF BIRTH:  12-21-1934  DATE OF ADMISSION:  02/13/2024  PRIMARY CARE PHYSICIAN: Buren Rock HERO, MD   Patient is coming from: Home  REQUESTING/REFERRING PHYSICIAN: Ernest Ronal, MD  CHIEF COMPLAINT:   Chief Complaint  Patient presents with   Fall    HISTORY OF PRESENT ILLNESS:  Kaitlin Gomez is a 89 y.o. African-American female with medical history significant for type 2 diabetes mellitus, hypertension, dyslipidemia and chron's disease as well as carotid artery stenosis, who presented to the emergency room with acute onset of fall when she slept out of her chair while trying to sit at 10 AM today.  Throughout the day she has been feeling weak to the point that at 5 PM she could not get up from her chair.  She denies any nausea or vomiting or diarrhea or abdominal pain.  No fever or chills.  No chest pain or palpitations.  No cough or wheezing or dyspnea.  She was noted Elevated blood pressure.  ED Course: When the patient came to the ER, BP was 145/59 with heart rate of 56 with otherwise normal vital signs.  Labs revealed a BUN of 54 and creatinine of 3.3 previously normal and elevated creatinine was 2.85.  Total bili was 1.3.  Total CK was 729 and high-sensitivity troponin I was 39 and later 52.  CMP otherwise was unremarkable. CBC showed WBCs of 10.6 with hematocrit 30.  UA came back positive for UTI.   EKG as reviewed by me :EKG showed normal sinus rhythm with rate of 75 with borderline prolonged QT interval with QTc 489 ms. Imaging: Portable chest x-ray and pelvic x-ray showed the following: 1. No evidence of acute chest disease.  Stable COPD chest. 2. Mild cardiomegaly. 3. No evidence of pelvic fracture or diastasis. 4. Osteopenia and degenerative change. 5. Aortic atherosclerosis. 6. Moderate to advanced bilateral hip arthrosis. Renal ultrasound revealed changes consistent with medical renal disease and  10 mm obstructing left renal stone with no other focal abnormalities.  The patient was given 500 mL IV normal saline bolus.  The patient will be admitted to an observation medical telemetry bed for further evaluation and management.   PAST MEDICAL HISTORY:   Past Medical History:  Diagnosis Date   Carotid artery occlusion    Crohn's disease (HCC)    Diabetes mellitus without complication (HCC)    Hyperlipemia    Hypertension    Hypokalemia 02/25/2022   Lower GI bleeding 09/13/2022    PAST SURGICAL HISTORY:   Past Surgical History:  Procedure Laterality Date   COLONOSCOPY WITH PROPOFOL  N/A 01/05/2015   Procedure: COLONOSCOPY WITH PROPOFOL ;  Surgeon: Gladis RAYMOND Mariner, MD;  Location: [facility] ENDOSCOPY;  Service: Endoscopy;  Laterality: N/A;   COLONOSCOPY WITH PROPOFOL  N/A 11/02/2016   Procedure: COLONOSCOPY WITH PROPOFOL ;  Surgeon: Therisa Bi, MD;  Location: [facility] ENDOSCOPY;  Service: Endoscopy;  Laterality: N/A;   COLONOSCOPY WITH PROPOFOL  N/A 09/14/2022   Procedure: COLONOSCOPY WITH PROPOFOL ;  Surgeon: Jinny Carmine, MD;  Location: [facility] ENDOSCOPY;  Service: Endoscopy;  Laterality: N/A;   IR ANGIOGRAM SELECTIVE EACH ADDITIONAL VESSEL  02/25/2022   IR ANGIOGRAM SELECTIVE EACH ADDITIONAL VESSEL  02/25/2022   IR ANGIOGRAM SELECTIVE EACH ADDITIONAL VESSEL  02/25/2022   IR ANGIOGRAM VISCERAL SELECTIVE  02/25/2022   IR EMBO ART  VEN HEMORR LYMPH EXTRAV  INC GUIDE ROADMAPPING  02/25/2022   IR US   GUIDE VASC ACCESS RIGHT  02/25/2022    SOCIAL HISTORY:   Social History   Tobacco Use   Smoking status: Never   Smokeless tobacco: Never  Substance Use Topics   Alcohol use: No    FAMILY HISTORY:   Family History  Problem Relation Age of Onset   Hypertension Father     DRUG ALLERGIES:   Allergies  Allergen Reactions   Advil [Ibuprofen] Rash    REVIEW OF SYSTEMS:   ROS As per history of present illness. All pertinent systems were reviewed above. Constitutional, HEENT, cardiovascular,  respiratory, GI, GU, musculoskeletal, neuro, psychiatric, endocrine, integumentary and hematologic systems were reviewed and are otherwise negative/unremarkable except for positive findings mentioned above in the HPI.   MEDICATIONS AT HOME:   Prior to Admission medications   Medication Sig Start Date End Date Taking? Authorizing Provider  amLODipine  (NORVASC ) 10 MG tablet Take 10 mg by mouth daily. 04/04/22  Yes [provider]  aspirin  EC 81 MG tablet Take 81 mg by mouth daily. Swallow whole.   Yes [provider]  calcium -vitamin D  (OSCAL 500/200 D-3) 500-200 MG-UNIT per tablet Take 1 tablet by mouth daily.   Yes [provider]  cloNIDine  (CATAPRES ) 0.3 MG tablet Take 0.3 mg by mouth 2 (two) times daily. 02/06/24  Yes [provider]  furosemide  (LASIX ) 40 MG tablet Take 40 mg by mouth daily. 03/13/22  Yes [provider]  HYDROcodone -acetaminophen  (NORCO) 5-325 MG tablet Take 1 tablet by mouth every 6 (six) hours as needed for moderate pain. 02/05/23  Yes Charlene Debby BROCKS, PA-C  latanoprost  (XALATAN ) 0.005 % ophthalmic solution Place 1 drop into both eyes at bedtime. 11/21/23  Yes [provider]  lisinopril  (ZESTRIL ) 40 MG tablet Take 40 mg by mouth daily. 04/04/22  Yes [provider]  lovastatin (MEVACOR) 40 MG tablet Take 40 mg by mouth at bedtime.   Yes [provider]  metFORMIN (GLUCOPHAGE-XR) 500 MG 24 hr tablet Take 500 mg by mouth daily. 10/09/16  Yes [provider]  metoprolol  tartrate (LOPRESSOR ) 50 MG tablet Take 1 tablet (50 mg total) by mouth 2 (two) times daily. 03/07/22  Yes Ghimire, Donalda HERO, MD  [facility] ULTRA test strip 1 each by Other route. 03/13/22   [provider]      VITAL SIGNS:  Blood pressure (!) 195/109, pulse 76, temperature 98 F (36.7 C), temperature source Oral, resp. rate (!) 24, height 5' 7 (1.702 m), weight 96.4 kg, SpO2 96%.  PHYSICAL EXAMINATION:  Physical  Exam  GENERAL:  89 y.o.-year-old African-American female patient lying in the bed with no acute distress.  EYES: Pupils equal, round, reactive to light and accommodation. No scleral icterus. Extraocular muscles intact.  HEENT: Head atraumatic, normocephalic. Oropharynx and nasopharynx clear.  NECK:  Supple, no jugular venous distention. No thyroid enlargement, no tenderness.  LUNGS: Normal breath sounds bilaterally, no wheezing, rales,rhonchi or crepitation. No use of accessory muscles of respiration.  CARDIOVASCULAR: Regular rate and rhythm, S1, S2 normal. No murmurs, rubs, or gallops.  ABDOMEN: Soft, nondistended, nontender. Bowel sounds present. No organomegaly or mass.  EXTREMITIES: No pedal edema, cyanosis, or clubbing.  NEUROLOGIC: Cranial nerves II through XII are intact. Muscle strength 5/5 in all extremities. Sensation intact. Gait not checked.  PSYCHIATRIC: The patient is alert and oriented x 3.  Normal affect and good eye contact. SKIN: No obvious rash, lesion, or ulcer.   LABORATORY PANEL:   CBC Recent Labs  Lab 02/14/24 0124  WBC 11.3*  HGB 15.4*  HCT 46.0  PLT 212   ------------------------------------------------------------------------------------------------------------------  Chemistries  Recent Labs  Lab 02/13/24 2019 02/14/24 0124  NA 139  --   K 3.9  --   CL 102  --   CO2 23  --   GLUCOSE 149*  --   BUN 54*  --   CREATININE 3.30* 2.85*  CALCIUM  9.3  --   AST 30  --   ALT 21  --   ALKPHOS 58  --   BILITOT 1.3*  --    ------------------------------------------------------------------------------------------------------------------  Cardiac Enzymes No results for input(s): TROPONINI in the last 168 hours. ------------------------------------------------------------------------------------------------------------------  RADIOLOGY:  US  Renal Result Date: 02/14/2024 CLINICAL DATA:  Renal failure EXAM: RENAL / URINARY TRACT ULTRASOUND COMPLETE  COMPARISON:  09/13/2018 FINDINGS: Right Kidney: Renal measurements: 10.8 x 5.0 x 4.8 cm. = volume: 135 mL. Simple cyst is noted measuring up to 1.3 cm. No follow-up is recommended. Mild increased echogenicity is noted. No cortical thinning is seen. Left Kidney: Renal measurements: 10.6 x 6.3 x 4.6 cm. = volume: 161 mL. No mass lesion or hydronephrosis is noted. 10 mm echogenic focus is noted consistent with nonobstructing stone. This is stable from the prior CT. Mild increased echogenicity is noted. Bladder: Appears normal for degree of bladder distention. Other: None. IMPRESSION: Changes consistent with medical renal disease. 10 mm nonobstructing left renal stone. No other focal abnormality is noted. Electronically Signed   By: Oneil Devonshire M.D.   On: 02/14/2024 02:13   DG Chest Portable 1 View Result Date: 02/13/2024 CLINICAL DATA:  Fall injury.  Polytrauma. EXAM: PORTABLE CHEST 1 VIEW PORTABLE PELVIS 1 VIEW COMPARISON:  Portable chest 03/03/2022, CTA abdomen and pelvis 09/13/2022. FINDINGS: Chest AP portable 11:02 p.m.: There is overlying material and telemetry leads. The lungs are mildly emphysematous but clear. There is mild chronic elevation of the right hemidiaphragm. The sulci are sharp. Mild cardiomegaly. There is a normal mediastinal configuration. Calcification of the transverse aorta. Thoracic cage is intact. Moderate thoracic spondylosis and bridging enthesopathy. Osteopenia. Portable AP pelvis: No pelvic fracture or diastasis is seen. No other focal bone lesions. Osteopenia. Symmetric moderate to advanced bilateral hip arthrosis with axial and superior joint space loss and slight acetabular protrusio with acetabular and femoral head osteophytes. Patchy calcification both common femoral arteries. Otherwise unremarkable soft tissues. Postsurgical change again noted right lower abdominal quadrant. IMPRESSION: 1. No evidence of acute chest disease.  Stable COPD chest. 2. Mild cardiomegaly. 3. No  evidence of pelvic fracture or diastasis. 4. Osteopenia and degenerative change. 5. Aortic atherosclerosis. 6. Moderate to advanced bilateral hip arthrosis. Electronically Signed   By: Francis Quam M.D.   On: 02/13/2024 23:23   DG Pelvis 1-2 Views Result Date: 02/13/2024 CLINICAL DATA:  Fall injury.  Polytrauma. EXAM: PORTABLE CHEST 1 VIEW PORTABLE PELVIS 1 VIEW COMPARISON:  Portable chest 03/03/2022, CTA abdomen and pelvis 09/13/2022. FINDINGS: Chest AP portable 11:02 p.m.: There is overlying material and telemetry leads. The lungs are mildly emphysematous but clear. There is mild chronic elevation of the right hemidiaphragm. The sulci are sharp. Mild cardiomegaly. There is a normal mediastinal configuration. Calcification of the transverse aorta. Thoracic cage is intact. Moderate thoracic spondylosis and bridging enthesopathy. Osteopenia. Portable AP pelvis: No pelvic fracture or diastasis is seen. No other focal bone lesions. Osteopenia. Symmetric moderate to advanced bilateral hip arthrosis with axial and superior joint space loss and slight acetabular protrusio with acetabular and femoral head osteophytes. Patchy calcification both common  femoral arteries. Otherwise unremarkable soft tissues. Postsurgical change again noted right lower abdominal quadrant. IMPRESSION: 1. No evidence of acute chest disease.  Stable COPD chest. 2. Mild cardiomegaly. 3. No evidence of pelvic fracture or diastasis. 4. Osteopenia and degenerative change. 5. Aortic atherosclerosis. 6. Moderate to advanced bilateral hip arthrosis. Electronically Signed   By: Francis Quam M.D.   On: 02/13/2024 23:23      IMPRESSION AND PLAN:  Assessment and Plan: * AKI (acute kidney injury) (HCC) - The patient will be admitted to a medical telemetry observation bed. - Will continue hydration with IV normal saline. - Will avoid nephrotoxins. - Will follow BMP.  Fall at home, initial encounter - Dislike secondary to generalized  weakness with associated mild rhabdomyolysis. - The patient will be hydrated with IV normal saline as mentioned above and CK will be followed up. - PT consult will be obtained.  Hypertensive urgency - Will place the patient on as needed IV hydralazine  and continue antihypertensive therapy while holding off nephrotoxins.  Type 2 diabetes mellitus without complications (HCC) - The patient will be placed on supplemental coverage with NovoLog . - Will hold off metformin.  Dyslipidemia - Will continue statin therapy.   DVT prophylaxis: Lovenox .  Advanced Care Planning:  Code Status: full code.  Family Communication:  The plan of care was discussed in details with the patient (and family). I answered all questions. The patient agreed to proceed with the above mentioned plan. Further management will depend upon hospital course. Disposition Plan: Back to previous home environment Consults called: none.  All the records are reviewed and case discussed with ED provider.  Status is: Observation  I certify that at the time of admission, it is my clinical judgment that the patient will require hospital care extending l ess than 2 midnights.                            Dispo: The patient is from: Home              Anticipated d/c is to: Home              Patient currently is not medically stable to d/c.              Difficult to place patient: No  Madison DELENA Peaches M.D on 02/14/2024 at 5:30 AM  [facility]   From 7 PM-7 AM, contact night-coverage [email or web address]  CC: Primary care physician; [place] HERO, MD

## 2024-02-14 NOTE — Consult Note (Signed)
 Pharmacy Consult Note - Anticoagulation  Pharmacy Consult for Heparin  drip  Indication: New Onset atrial fibrillation Allergies  Allergen Reactions   Advil [Ibuprofen] Rash    PATIENT MEASUREMENTS: Height: 5' 7 (170.2 cm) Weight: 96.4 kg (212 lb 8.4 oz) IBW/kg (Calculated) : 61.6 HEPARIN  DW (KG): 77.7  VITAL SIGNS: Temp: 97.5 F (36.4 C) (09/19 1435) Temp Source: Oral (09/19 1435) BP: 132/63 (09/19 0945) Pulse Rate: 82 (09/19 0945)  Recent Labs    02/14/24 0124 02/14/24 0555  HGB 15.4* 15.6*  HCT 46.0 47.2*  PLT 212 206  CREATININE 2.85* 2.51*  CKTOTAL 729*  --   TROPONINIHS 52*  --     Estimated Creatinine Clearance: 18.8 mL/min (A) (by C-G formula based on SCr of 2.51 mg/dL (H)).  PAST MEDICAL HISTORY: Past Medical History:  Diagnosis Date   Carotid artery occlusion    Crohn's disease (HCC)    Diabetes mellitus without complication (HCC)    Hyperlipemia    Hypertension    Hypokalemia 02/25/2022   Lower GI bleeding 09/13/2022    Medications:  (Not in a hospital admission)  Scheduled:   amLODipine   10 mg Oral Daily   aspirin  EC  81 mg Oral Daily   atorvastatin   40 mg Oral Daily   calcium -vitamin D   1 tablet Oral Daily   cloNIDine   0.3 mg Oral BID   heparin   3,900 Units Intravenous Once   insulin  aspart  0-9 Units Subcutaneous TID WC   latanoprost   1 drop Both Eyes QHS   metoprolol  tartrate  50 mg Oral BID   Infusions:   sodium chloride  100 mL/hr at 02/14/24 1434   heparin      PRN: acetaminophen  **OR** acetaminophen , hydrALAZINE , HYDROcodone -acetaminophen , labetalol , magnesium  hydroxide, ondansetron  **OR** ondansetron  (ZOFRAN ) IV, traZODone   ASSESSMENT: 89 y.o. female with PMH CVA, CAD, T2DM, and HTN who is presenting with New onset Atrial Fibrillation. Patient is not on chronic anticoagulation per chart review. Pharmacy has been consulted to initiate and manage heparin  intravenous infusion.   Goal(s) of therapy: Heparin  level 0.3 - 0.7  units/mL aPTT 66 - 102 seconds Monitor platelets by anticoagulation protocol: Yes   Baseline anticoagulation labs: Recent Labs    02/13/24 2019 02/14/24 0124 02/14/24 0555  HGB 14.4 15.4* 15.6*  PLT 223 212 206    0919: aPTT and INR pending  PLAN:  Give 3900 units bolus x1; then start heparin  infusion at 1100 units/hour.  Check heparin  level in 8 hours, then daily once at least two levels are consecutively therapeutic.  Monitor CBC daily while on heparin  infusion.   Annabella LOISE Banks, PharmD Clinical Pharmacist 02/14/2024 2:53 PM

## 2024-02-14 NOTE — Assessment & Plan Note (Signed)
-   The patient will be placed on supplemental coverage with NovoLog. - Will hold off metformin.

## 2024-02-14 NOTE — ED Notes (Signed)
 This tech and ultrasound tech placed pt on bedpan.

## 2024-02-14 NOTE — ED Notes (Addendum)
 US  IV is infiltrated with NS. Arm is very puffy at this time. Would not allow Heparin  to flow.  IV team at bedside.

## 2024-02-14 NOTE — ED Notes (Addendum)
 This RN attempted second IV for heparin  and blood draw. Lab called for blood draw as I am unsuccessful. Also unable to pull blood off US  IV in left upper arm.

## 2024-02-14 NOTE — Assessment & Plan Note (Signed)
-   The patient will be admitted to a medical telemetry observation bed. - Will continue hydration with IV normal saline. - Will avoid nephrotoxins. - Will follow BMP.

## 2024-02-14 NOTE — Consult Note (Signed)
 Cardiology Consultation:   Patient ID: Kaitlin Gomez; [id number]; [birth year]   Admit date: 02/13/2024 Date of Consult: 02/14/2024  Primary Care Provider: Buren Rock HERO, MD Primary Cardiologist: O'Neal Primary Electrophysiologist:  None   Patient Profile:   Kaitlin Gomez is a 89 y.o. female with a hx of diverticulosis complicated by lower GI bleeding in 02/2022 s/p embolization with recurrent bleed in 2024, possible Crohn's disease, recurrent CVA, severe left-sided carotid artery stenosis, DM2, HTN, and HLD who was admitted to [facility] on 02/13/2024 with acute CVA complicated by acute renal failure and newly diagnosed A-fib, and is being seen today for the evaluation of newly diagnosed A-fib at the request of Dr. Leesa.  History of Present Illness:   Kaitlin Gomez was evaluated in our office in 04/2022 at the request of her PCP for evaluation of stroke.  She was admitted in 02/2022 with GI bleed and found to have acute ischemic strokes thought to be related to embolic phenomenon.  She was found to have left ICA 80 to 99% stenosis.  CTA of the neck demonstrated high-grade approximately 80% stenosis of the proximal left ICA.  Plan was for outpatient revascularization.  However, upon repeat imaging this had improved to 60 to 79% with vascular surgery recommending conservative therapy and monitoring.  Echo during the admission showed an EF of 60 to 65%, no regional wall motion abnormalities, mild LVH, grade 1 diastolic dysfunction, normal RV systolic function and ventricular cavity size, normal RVSP, moderate mitral annular calcification.  Given high-grade carotid artery disease, cardiac monitoring was not pursued at that time.  She was readmitted to the hospital in 08/2022 with recurrent GI bleed conservatively managed.   She reports an episode of sliding out of her chair 2 days ago while attempting to stand up and was unable to get up off of the ground.  EMS was called to assist the patient.  She did not  undergo transported to the hospital at that time.  She presented to the ED on 02/13/2024 after waking up and feeling weak in both legs.  She tried to ambulate but was unable to do so.  She denied any chest pain, dyspnea, palpitations, dizziness, presyncope, or syncope.  Upon arrival to the ED she was hemodynamically stable with EKG showing sinus rhythm.  Chest x-ray and pelvic plain film without acute process.  Labs: Initial high-sensitivity troponin 39 with a delta troponin 52, BNP 39 serum creatinine 3.3 with baseline around 0.8-0.9, potassium 3.9.  Renal ultrasound showed medical renal disease with a 10 mm obstructing left renal stone.  She was given IV fluids and admitted for further evaluation.  Overnight her blood pressure trended to 219/200 leading to initiation of Cardene  drip.  However, repeating blood pressure with a new BP cuff showed normalization of BP with subsequent discontinuation of Cardene  drip.  She was found to have A-fib with RVR this morning with rates into the 190s bpm developing at 9:15, treated with IV metoprolol  5 mg x 1 with improvement in heart rate continued A-fib.  MRI of the brain showed acute infarct in the posterior limb of the right internal capsule as well as old infarcts.  She has been placed on therapeutic Lovenox  and cardiology is asked to evaluate A-fib.  At time of cardiology consult she remains in rate controlled A-fib with rates in the 60s to 70s bpm.  She is unaware of any palpitations.  No symptoms of angina or cardiac decompensation.  She continues to note lower extremity  weakness.  She has not had any further symptoms concerning for GI bleeding since hospitalization in 2024.    Past Medical History:  Diagnosis Date   Carotid artery occlusion    Crohn's disease (HCC)    Diabetes mellitus without complication (HCC)    Hyperlipemia    Hypertension    Hypokalemia 02/25/2022   Lower GI bleeding 09/13/2022    Past Surgical History:  Procedure Laterality Date    COLONOSCOPY WITH PROPOFOL  N/A 01/05/2015   Procedure: COLONOSCOPY WITH PROPOFOL ;  Surgeon: Gladis RAYMOND Mariner, MD;  Location: [facility] ENDOSCOPY;  Service: Endoscopy;  Laterality: N/A;   COLONOSCOPY WITH PROPOFOL  N/A 11/02/2016   Procedure: COLONOSCOPY WITH PROPOFOL ;  Surgeon: Therisa Bi, MD;  Location: [facility] ENDOSCOPY;  Service: Endoscopy;  Laterality: N/A;   COLONOSCOPY WITH PROPOFOL  N/A 09/14/2022   Procedure: COLONOSCOPY WITH PROPOFOL ;  Surgeon: Jinny Carmine, MD;  Location: [facility] ENDOSCOPY;  Service: Endoscopy;  Laterality: N/A;   IR ANGIOGRAM SELECTIVE EACH ADDITIONAL VESSEL  02/25/2022   IR ANGIOGRAM SELECTIVE EACH ADDITIONAL VESSEL  02/25/2022   IR ANGIOGRAM SELECTIVE EACH ADDITIONAL VESSEL  02/25/2022   IR ANGIOGRAM VISCERAL SELECTIVE  02/25/2022   IR EMBO ART  VEN HEMORR LYMPH EXTRAV  INC GUIDE ROADMAPPING  02/25/2022   IR US  GUIDE VASC ACCESS RIGHT  02/25/2022     Home Meds: Prior to Admission medications   Medication Sig Start Date End Date Taking? Authorizing Provider  amLODipine  (NORVASC ) 10 MG tablet Take 10 mg by mouth daily. 04/04/22  Yes [provider]  aspirin  EC 81 MG tablet Take 81 mg by mouth daily. Swallow whole.   Yes [provider]  calcium -vitamin D  (OSCAL 500/200 D-3) 500-200 MG-UNIT per tablet Take 1 tablet by mouth daily.   Yes [provider]  cloNIDine  (CATAPRES ) 0.3 MG tablet Take 0.3 mg by mouth 2 (two) times daily. 02/06/24  Yes [provider]  furosemide  (LASIX ) 40 MG tablet Take 40 mg by mouth daily. 03/13/22  Yes [provider]  HYDROcodone -acetaminophen  (NORCO) 5-325 MG tablet Take 1 tablet by mouth every 6 (six) hours as needed for moderate pain. 02/05/23  Yes Charlene Debby BROCKS, PA-C  latanoprost  (XALATAN ) 0.005 % ophthalmic solution Place 1 drop into both eyes at bedtime. 11/21/23  Yes [provider]  lisinopril  (ZESTRIL ) 40 MG tablet Take 40 mg by mouth daily. 04/04/22  Yes [provider]  lovastatin  (MEVACOR) 40 MG tablet Take 40 mg by mouth at bedtime.   Yes [provider]  metFORMIN (GLUCOPHAGE-XR) 500 MG 24 hr tablet Take 500 mg by mouth daily. 10/09/16  Yes [provider]  metoprolol  tartrate (LOPRESSOR ) 50 MG tablet Take 1 tablet (50 mg total) by mouth 2 (two) times daily. 03/07/22  Yes Ghimire, Donalda HERO, MD  [facility] ULTRA test strip 1 each by Other route. 03/13/22   [provider]    Inpatient Medications: Scheduled Meds:  amLODipine   10 mg Oral Daily   aspirin  EC  81 mg Oral Daily   atorvastatin   40 mg Oral Daily   calcium -vitamin D   1 tablet Oral Daily   cloNIDine   0.3 mg Oral BID   heparin   3,900 Units Intravenous Once   insulin  aspart  0-9 Units Subcutaneous TID WC   latanoprost   1 drop Both Eyes QHS   metoprolol  tartrate  50 mg Oral BID   Continuous Infusions:  sodium chloride  100 mL/hr at 02/14/24 1434   heparin      PRN Meds: acetaminophen  **OR** acetaminophen ,  hydrALAZINE , HYDROcodone -acetaminophen , labetalol , magnesium  hydroxide, ondansetron  **OR** ondansetron  (ZOFRAN ) IV, traZODone   Allergies:   Allergies  Allergen Reactions   Advil [Ibuprofen] Rash    Social History:   Social History   Socioeconomic History   Marital status: Divorced    Spouse name: Not on file   Number of children: 1   Years of education: Not on file   Highest education level: Not on file  Occupational History   Occupation: [employer] - Retired  Tobacco Use   Smoking status: Never   Smokeless tobacco: Never  Vaping Use   Vaping status: Never Used  Substance and Sexual Activity   Alcohol use: No   Drug use: No   Sexual activity: Not Currently  Other Topics Concern   Not on file  Social History Narrative   Not on file   Social Drivers of Health   Financial Resource Strain: Not on file  Food Insecurity: No Food Insecurity (09/13/2022)   Hunger Vital Sign    Worried About Running Out of Food in the Last Year: Never true    Ran Out  of Food in the Last Year: Never true  Transportation Needs: No Transportation Needs (09/13/2022)   PRAPARE - Administrator, Civil Service (Medical): No    Lack of Transportation (Non-Medical): No  Physical Activity: Not on file  Stress: Not on file  Social Connections: Not on file  Intimate Partner Violence: Not At Risk (09/13/2022)   Humiliation, Afraid, Rape, and Kick questionnaire    Fear of Current or Ex-Partner: No    Emotionally Abused: No    Physically Abused: No    Sexually Abused: No     Family History:   Family History  Problem Relation Age of Onset   Hypertension Father     ROS:  Review of Systems  Constitutional:  Positive for malaise/fatigue. Negative for chills, diaphoresis, fever and weight loss.  HENT:  Negative for congestion.   Eyes:  Negative for discharge and redness.  Respiratory:  Negative for cough, sputum production, shortness of breath and wheezing.   Cardiovascular:  Negative for chest pain, palpitations, orthopnea, claudication, leg swelling and PND.  Gastrointestinal:  Negative for abdominal pain, blood in stool, heartburn, melena, nausea and vomiting.  Musculoskeletal:  Positive for falls. Negative for myalgias.  Skin:  Negative for rash.  Neurological:  Positive for weakness. Negative for tingling, tremors, sensory change, speech change, focal weakness, seizures, loss of consciousness and headaches.  Endo/Heme/Allergies:  Does not bruise/bleed easily.  Psychiatric/Behavioral:  Negative for substance abuse. The patient is not nervous/anxious.   All other systems reviewed and are negative.     Physical Exam/Data:   Vitals:   02/14/24 0730 02/14/24 0945 02/14/24 1014 02/14/24 1435  BP: (!) 189/79 132/63    Pulse:  82    Resp:  17    Temp:   98.2 F (36.8 C) (!) 97.5 F (36.4 C)  TempSrc:   Oral Oral  SpO2: 100% 100%    Weight:      Height:        Intake/Output Summary (Last 24 hours) at 02/14/2024 1503 Last data filed at  02/14/2024 1434 Gross per 24 hour  Intake 1264.36 ml  Output 650 ml  Net 614.36 ml   Filed Weights   02/13/24 2018 02/14/24 0151  Weight: 79.4 kg 96.4 kg   Body mass index is 33.29 kg/m.   Physical Exam: General: Well developed, well nourished, in no acute distress. Head: Normocephalic,  atraumatic, sclera non-icteric, no xanthomas, nares without discharge.  Neck: Negative for carotid bruits. JVD not elevated. Lungs: Clear bilaterally to auscultation without wheezes, rales, or rhonchi. Breathing is unlabored. Heart: IRIR with S1 S2. No murmurs, rubs, or gallops appreciated. Abdomen: Soft, non-tender, non-distended with normoactive bowel sounds. No hepatomegaly. No rebound/guarding. No obvious abdominal masses. Msk:  Strength and tone appear normal for age. Extremities: No clubbing or cyanosis. No edema. Distal pedal pulses are 2+ and equal bilaterally. Neuro: Alert and oriented X 3. No facial asymmetry.  Diminished lower extremity strength and range of motion that she reports is new for her since 9/18. Psych:  Responds to questions appropriately with a normal affect.   EKG:  The EKG was personally reviewed and demonstrates: 02/13/2024 - NSR, 75 bpm, nonspecific ST-T changes, baseline wandering.  02/14/2024 - A-fib with RVR, 147 bpm, inferolateral ST-T changes Telemetry:  Telemetry was personally reviewed and demonstrates: SR with PACs developing Afib with RVR into the 190s bpm at 9:15 AM on 9/19, remaining in Afib since with ventricular rates in the 60s to 70s bpm  Weights: Filed Weights   02/13/24 2018 02/14/24 0151  Weight: 79.4 kg 96.4 kg    Relevant CV Studies:  2D echo 02/14/2024: 1. Left ventricular ejection fraction, by estimation, is 50 to 55%. The  left ventricle has low normal function. The left ventricle demonstrates  regional wall motion abnormalities (hypokinesis of the basal inferior  wall). There is moderate left  ventricular hypertrophy. Left ventricular diastolic  parameters are  indeterminate.   2. Right ventricular systolic function is normal. The right ventricular  size is normal. Tricuspid regurgitation signal is inadequate for assessing  PA pressure.   3. Left atrial size was mildly dilated.   4. The mitral valve is normal in structure. Mild mitral valve  regurgitation. No evidence of mitral stenosis.   5. The aortic valve has an indeterminant number of cusps. Aortic valve  regurgitation is not visualized. No aortic stenosis is present.   6. The inferior vena cava is normal in size with greater than 50%  respiratory variability, suggesting right atrial pressure of 3 mmHg.  __________  2D echo 02/26/2022: 1. Left ventricular ejection fraction, by estimation, is 60 to 65%. The  left ventricle has normal function. The left ventricle has no regional  wall motion abnormalities. There is mild left ventricular hypertrophy.  Left ventricular diastolic parameters  are consistent with Grade I diastolic dysfunction (impaired relaxation).   2. Right ventricular systolic function is normal. The right ventricular  size is normal. There is normal pulmonary artery systolic pressure. The  estimated right ventricular systolic pressure is 26.2 mmHg.   3. The mitral valve is degenerative. No evidence of mitral valve  regurgitation. Moderate mitral annular calcification.   4. The aortic valve was not well visualized. Aortic valve regurgitation  is not visualized. No aortic stenosis is present.    Laboratory Data:  Chemistry Recent Labs  Lab 02/13/24 2019 02/14/24 0124 02/14/24 0555  NA 139  --  140  K 3.9  --  3.3*  CL 102  --  100  CO2 23  --  25  GLUCOSE 149*  --  136*  BUN 54*  --  49*  CREATININE 3.30* 2.85* 2.51*  CALCIUM  9.3  --  9.3  GFRNONAA 13* 16* 18*  ANIONGAP 14  --  15    Recent Labs  Lab 02/13/24 2019  PROT 7.2  ALBUMIN 3.5  AST 30  ALT 21  ALKPHOS 58  BILITOT 1.3*   Hematology Recent Labs  Lab 02/13/24 2019  02/14/24 0124 02/14/24 0555  WBC 10.6* 11.3* 10.5  RBC 4.73 5.13* 5.19*  HGB 14.4 15.4* 15.6*  HCT 43.7 46.0 47.2*  MCV 92.4 89.7 90.9  MCH 30.4 30.0 30.1  MCHC 33.0 33.5 33.1  RDW 13.6 13.5 13.3  PLT 223 212 206   Cardiac EnzymesNo results for input(s): TROPONINI in the last 168 hours. No results for input(s): TROPIPOC in the last 168 hours.  BNP Recent Labs  Lab 02/13/24 2019  BNP 39.8    DDimer No results for input(s): DDIMER in the last 168 hours.  Radiology/Studies:   MR BRAIN WO CONTRAST Result Date: 02/14/2024 IMPRESSION: 1. Acute infarct in the posterior limb of the right internal capsule. 2. Background of moderate chronic small vessel disease with old infarcts along the left middle frontal gyrus and medial aspect of the left cerebellar hemisphere. Electronically signed by: Charisse Wendell Chess MD 02/14/2024 12:40 PM EDT RP Workstation: [id number]   US  Renal Result Date: 02/14/2024 IMPRESSION: Changes consistent with medical renal disease. 10 mm nonobstructing left renal stone. No other focal abnormality is noted. Electronically Signed   By: Oneil Devonshire M.D.   On: 02/14/2024 02:13   DG Chest Portable 1 View Result Date: 02/13/2024 IMPRESSION: 1. No evidence of acute chest disease.  Stable COPD chest. 2. Mild cardiomegaly. 3. No evidence of pelvic fracture or diastasis. 4. Osteopenia and degenerative change. 5. Aortic atherosclerosis. 6. Moderate to advanced bilateral hip arthrosis. Electronically Signed   By: Francis Quam M.D.   On: 02/13/2024 23:23   DG Pelvis 1-2 Views Result Date: 02/13/2024 IMPRESSION: 1. No evidence of acute chest disease.  Stable COPD chest. 2. Mild cardiomegaly. 3. No evidence of pelvic fracture or diastasis. 4. Osteopenia and degenerative change. 5. Aortic atherosclerosis. 6. Moderate to advanced bilateral hip arthrosis. Electronically Signed   By: Francis Quam M.D.   On: 02/13/2024 23:23    Assessment and Plan:   1. Newly diagnosed A-fib  with RVR:  - Presented to [facility] in sinus rhythm with development of A-fib with RVR at 19:15 a.m. 02/14/2024 and remains in rate controlled A-fib since with rates in the 60s to 70s bpm - Lopressor  50 mg twice daily  - For now, in the setting of acute CVA, anticipate rate control strategy with recommendation to pursue outpatient DCCV after she has been adequately anticoagulated without interruption for a minimum of 3 to 4 weeks - If however she has difficult to control ventricular rates or is symptomatic with ambulation while working with PT/OT, would need to pursue TEE guided DCCV prior to discharge (hopefully this can be avoided given recent CVA) - CHA2DS2-VASc at least 8 (HTN, age x 2, CVA x 2, DM, vascular disease, sex category)  - Patient has been placed on a heparin  drip by internal medicine, will recommend transitioning to DOAC when cleared by neurology  - Patient does have a history of recurrent GI bleed, if she redevelops GI bleed on anticoagulation, would recommend EP evaluation for consideration of Watchman  - TSH pending  - Check magnesium  with recommendation to replete to goal 2.0 as indicated  - Replete potassium to goal 4.0   2. Elevated troponin:  - Never with symptoms of angina - Mildly elevated and flat trending, not consistent with ACS  - Likely supply/demand ischemia in the setting of CVA and rhabdomyolysis from a mechanical fall 2 days prior to admission complicated by acute renal  failure  - Patient currently on heparin  drip given diagnosis of A-fib  - Echo this admission with low normal LV systolic function with hypokinesis of the basal inferior wall  - Patient not currently coronary CTA or cardiac cath candidate with acute renal failure  - Consider outpatient ischemic testing given lack of anginal symptoms and in the context of acute CVA   3. Recurrent CVA:  - While it is certainly probable that the A-fib has contributed to her recurrent CVA, she has a known high-grade  stenosis involving the left ICA of approximately 80% in 2023 with imaging in 05/2022 showing 60 to 79% left ICA stenosis - It would be worthwhile for her carotid artery stenosis to be trended to see if this has progressed, will obtain carotid artery ultrasound - Nonetheless, this does not change the recommendation that the patient will need OAC given documented A-fib - Patient on heparin  drip by internal medicine, may need to interrupt in an effort to reduce risk of hemorrhagic conversion, will defer to neurology and internal medicine  - Neurology consulted by internal medicine  - Currently on aspirin  and statin   4. Carotid artery stenosis:  - At time of CVA in 2023 she was found to have high-grade left ICA stenosis of approximately 80%, recommend updating   - Aspirin  and statin for now   5. AKI:  - Admission creatinine 3.3 with baseline around 0.8-0.9  - Improving to 2.51  - Unclear etiology, suspect less likely hypotension related to A-fib with RVR given the patient has been normotensive to hypertensive while in A-fib with RVR in the hospital  - Cannot exclude hypertensive renal disease  - Avoid nephrotoxic agents  - Further management per internal medicine   6. Accelerated hypertension:  - Blood pressure improved to the 130s mmHg systolic  - Currently on amlodipine  10 mg, clonidine  0.3 mg twice daily, and   - Lopressor  50 mg twice daily   7. Recurrent GI bleed:  - Denies symptoms for active bleeding  - Hemoglobin stable   8. Hypokalemia:  - Replete to goal 4.0   9. HLD:  - Lipid panel pending  - Atorvastatin  40 mg        For questions or updates, please contact [facility] Please consult [email or web address] for contact info under Cardiology/STEMI.   Signed, Bernardino Bring, PA-C [facility] Pager: [phone] 02/14/2024, 3:03 PM

## 2024-02-14 NOTE — Consult Note (Signed)
 NEUROLOGY CONSULT NOTE   Date of service: February 14, 2024 Patient Name: Kaitlin Gomez MRN:  [id number] DOB:  07/03/1934 Chief Complaint: weakness Requesting Provider: Leesa Kast, DO  History of Present Illness  Kaitlin Gomez is a 89 y.o. female with hx of DM, HTN, HLD, carotid artery occlusion and Crohns who presents with complaints of weakness.  Patient reports that although some weak prior was able to get up and about until yesterday.  Yesterday found herself unable to get up from her chair.  Kaitlin Gomez of her chair.  Was able to contact a neighbor and patient was brought in for evaluation.    LKW: 02/13/2024 @ 1000 Modified rankin score: 1-No significant post stroke disability and can perform usual duties with stroke symptoms IV Thrombolysis:  No, outside time window EVT:  No, outside time window   NIHSS components Score: Comment  1a Level of Conscious 0[x]  1[]  2[]  3[]      1b LOC Questions 0[x]  1[]  2[]       1c LOC Commands 0[x]  1[]  2[]       2 Best Gaze 0[x]  1[]  2[]       3 Visual 0[x]  1[]  2[]  3[]      4 Facial Palsy 0[x]  1[]  2[]  3[]      5a Motor Arm - left 0[x]  1[]  2[]  3[]  4[]  UN[]    5b Motor Arm - Right 0[]  1[x]  2[]  3[]  4[]  UN[]    6a Motor Leg - Left 0[]  1[]  2[]  3[x]  4[]  UN[]    6b Motor Leg - Right 0[]  1[]  2[]  3[x]  4[]  UN[]    7 Limb Ataxia 0[x]  1[]  2[]  UN[]      8 Sensory 0[x]  1[]  2[]  UN[]      9 Best Language 0[x]  1[]  2[]  3[]      10 Dysarthria 0[x]  1[]  2[]  UN[]      11 Extinct. and Inattention 0[x]  1[]  2[]       TOTAL: 7       ROS  Comprehensive ROS performed and pertinent positives documented in HPI    Past History   Past Medical History:  Diagnosis Date   Carotid artery occlusion    Crohn's disease (HCC)    Diabetes mellitus without complication (HCC)    Hyperlipemia    Hypertension    Hypokalemia 02/25/2022   Lower GI bleeding 09/13/2022    Past Surgical History:  Procedure Laterality Date   COLONOSCOPY WITH PROPOFOL  N/A 01/05/2015   Procedure:  COLONOSCOPY WITH PROPOFOL ;  Surgeon: Gladis RAYMOND Mariner, MD;  Location: [facility] ENDOSCOPY;  Service: Endoscopy;  Laterality: N/A;   COLONOSCOPY WITH PROPOFOL  N/A 11/02/2016   Procedure: COLONOSCOPY WITH PROPOFOL ;  Surgeon: Therisa Bi, MD;  Location: [facility] ENDOSCOPY;  Service: Endoscopy;  Laterality: N/A;   COLONOSCOPY WITH PROPOFOL  N/A 09/14/2022   Procedure: COLONOSCOPY WITH PROPOFOL ;  Surgeon: Jinny Carmine, MD;  Location: [facility] ENDOSCOPY;  Service: Endoscopy;  Laterality: N/A;   IR ANGIOGRAM SELECTIVE EACH ADDITIONAL VESSEL  02/25/2022   IR ANGIOGRAM SELECTIVE EACH ADDITIONAL VESSEL  02/25/2022   IR ANGIOGRAM SELECTIVE EACH ADDITIONAL VESSEL  02/25/2022   IR ANGIOGRAM VISCERAL SELECTIVE  02/25/2022   IR EMBO ART  VEN HEMORR LYMPH EXTRAV  INC GUIDE ROADMAPPING  02/25/2022   IR US  GUIDE VASC ACCESS RIGHT  02/25/2022    Family History: Family History  Problem Relation Age of Onset   Hypertension Father     Social History  reports that she has never smoked. She has never used smokeless tobacco. She reports that she does not drink alcohol and does  not use drugs.  Allergies  Allergen Reactions   Advil [Ibuprofen] Rash    Medications   Current Facility-Administered Medications:    0.9 %  sodium chloride  infusion, , Intravenous, Continuous, Mansy, Jan A, MD, Last Rate: 100 mL/hr at 02/14/24 1655, New Bag at 02/14/24 1655   acetaminophen  (TYLENOL ) tablet 650 mg, 650 mg, Oral, Q6H PRN **OR** acetaminophen  (TYLENOL ) suppository 650 mg, 650 mg, Rectal, Q6H PRN, Mansy, Jan A, MD   amLODipine  (NORVASC ) tablet 10 mg, 10 mg, Oral, Daily, Mansy, Jan A, MD, 10 mg at 02/14/24 0911   aspirin  EC tablet 81 mg, 81 mg, Oral, Daily, Mansy, Jan A, MD, 81 mg at 02/14/24 9088   atorvastatin  (LIPITOR) tablet 40 mg, 40 mg, Oral, Daily, Dezii, Alexandra, DO, 40 mg at 02/14/24 1432   calcium -vitamin D  (OSCAL WITH D) 500-5 MG-MCG per tablet 1 tablet, 1 tablet, Oral, Daily, Mansy, Jan A, MD, 1 tablet at 02/14/24 9088    cloNIDine  (CATAPRES ) tablet 0.3 mg, 0.3 mg, Oral, BID, Mansy, Jan A, MD, 0.3 mg at 02/14/24 0911   heparin  ADULT infusion 100 units/mL (25000 units/250mL), 1,100 Units/hr, Intravenous, Continuous, Dezii, Alexandra, DO, Last Rate: 11 mL/hr at 02/14/24 1657, 1,100 Units/hr at 02/14/24 1657   hydrALAZINE  (APRESOLINE ) injection 10 mg, 10 mg, Intravenous, Q6H PRN, Mansy, Jan A, MD, 10 mg at 02/14/24 0334   HYDROcodone -acetaminophen  (NORCO/VICODIN) 5-325 MG per tablet 1 tablet, 1 tablet, Oral, Q6H PRN, Mansy, Jan A, MD   insulin  aspart (novoLOG ) injection 0-9 Units, 0-9 Units, Subcutaneous, TID WC, Dezii, Alexandra, DO   labetalol  (NORMODYNE ) injection 20 mg, 20 mg, Intravenous, Q3H PRN, Mansy, Jan A, MD   latanoprost  (XALATAN ) 0.005 % ophthalmic solution 1 drop, 1 drop, Both Eyes, QHS, Mansy, Jan A, MD   magnesium  hydroxide (MILK OF MAGNESIA) suspension 30 mL, 30 mL, Oral, Daily PRN, Mansy, Jan A, MD   metoprolol  tartrate (LOPRESSOR ) tablet 50 mg, 50 mg, Oral, BID, Mansy, Jan A, MD, 50 mg at 02/14/24 9089   ondansetron  (ZOFRAN ) tablet 4 mg, 4 mg, Oral, Q6H PRN **OR** ondansetron  (ZOFRAN ) injection 4 mg, 4 mg, Intravenous, Q6H PRN, Mansy, Jan A, MD   traZODone  (DESYREL ) tablet 25 mg, 25 mg, Oral, QHS PRN, Mansy, Jan A, MD  Current Outpatient Medications:    amLODipine  (NORVASC ) 10 MG tablet, Take 10 mg by mouth daily., Disp: , Rfl:    aspirin  EC 81 MG tablet, Take 81 mg by mouth daily. Swallow whole., Disp: , Rfl:    calcium -vitamin D  (OSCAL 500/200 D-3) 500-200 MG-UNIT per tablet, Take 1 tablet by mouth daily., Disp: , Rfl:    cloNIDine  (CATAPRES ) 0.3 MG tablet, Take 0.3 mg by mouth 2 (two) times daily., Disp: , Rfl:    furosemide  (LASIX ) 40 MG tablet, Take 40 mg by mouth daily., Disp: , Rfl:    HYDROcodone -acetaminophen  (NORCO) 5-325 MG tablet, Take 1 tablet by mouth every 6 (six) hours as needed for moderate pain., Disp: 10 tablet, Rfl: 0   latanoprost  (XALATAN ) 0.005 % ophthalmic solution, Place 1  drop into both eyes at bedtime., Disp: , Rfl:    lisinopril  (ZESTRIL ) 40 MG tablet, Take 40 mg by mouth daily., Disp: , Rfl:    lovastatin (MEVACOR) 40 MG tablet, Take 40 mg by mouth at bedtime., Disp: , Rfl:    metFORMIN (GLUCOPHAGE-XR) 500 MG 24 hr tablet, Take 500 mg by mouth daily., Disp: , Rfl:    metoprolol  tartrate (LOPRESSOR ) 50 MG tablet, Take 1 tablet (50 mg total) by  mouth 2 (two) times daily., Disp: 60 tablet, Rfl: 2   ONETOUCH ULTRA test strip, 1 each by Other route., Disp: , Rfl:   Vitals   Vitals:   02/15/2024 1014 02/15/24 1435 02/15/24 1655 02-15-2024 1710  BP:    123/65  Pulse:   69 75  Resp:    16  Temp: 98.2 F (36.8 C) (!) 97.5 F (36.4 C)  98.2 F (36.8 C)  TempSrc: Oral Oral  Oral  SpO2:   97% 99%  Weight:      Height:        Body mass index is 33.29 kg/m.   Physical Exam   Constitutional: Appears well-developed and well-nourished. Generalized weakness Psych: Affect appropriate to situation.  Eyes: No scleral injection.  Head: Normocephalic.  Respiratory: Effort normal, non-labored breathing.  GI: Soft.  No distension. There is no tenderness.  Extremities: BLE edema  Neurologic Examination   Neurological Examination   Mental Status: Alert, oriented, thought content appropriate.  Speech fluent without evidence of aphasia.  Able to follow 3 step commands without difficulty. Cranial Nerves: II: Visual fields grossly normal III,IV, VI: ptosis not present, extra-ocular motions intact bilaterally V,VII: smile symmetric, facial light touch sensation normal bilaterally VIII: hearing normal bilaterally XI: bilateral shoulder shrug XII: midline tongue extension Motor: Right : Upper extremity   4/5    Left:     Upper extremity   5-/5  Lower extremity   2/5     Lower extremity   2/5 Tone and bulk:normal tone throughout; no atrophy noted Sensory: Pinprick and light touch intact throughout, bilaterally Deep Tendon Reflexes: Symmetric  throughout Plantars: Right: mute   Left: mute Cerebellar: normal finger-to-nose testing bilaterally Gait: not tested due to safety concerns      Labs/Imaging/Neurodiagnostic studies   CBC:  Recent Labs  Lab 02-15-24 0124 February 15, 2024 0555  WBC 11.3* 10.5  HGB 15.4* 15.6*  HCT 46.0 47.2*  MCV 89.7 90.9  PLT 212 206   Basic Metabolic Panel:  Lab Results  Component Value Date   NA 140 02-15-24   K 3.3 (L) 15-Feb-2024   CO2 25 February 15, 2024   GLUCOSE 136 (H) 02-15-24   BUN 49 (H) 02/15/2024   CREATININE 2.51 (H) 02-15-2024   CALCIUM  9.3 February 15, 2024   GFRNONAA 18 (L) Feb 15, 2024   GFRAA >60 11/02/2016   Lipid Panel:  Lab Results  Component Value Date   LDLCALC 107 (H) 2024/02/15   HgbA1c:  Lab Results  Component Value Date   HGBA1C 6.3 (H) 02/25/2022   Urine Drug Screen: No results found for: LABOPIA, COCAINSCRNUR, LABBENZ, AMPHETMU, THCU, LABBARB  Alcohol Level No results found for: [facility] INR  Lab Results  Component Value Date   INR 1.3 (H) Feb 15, 2024   APTT  Lab Results  Component Value Date   APTT 26 February 15, 2024   AED levels: No results found for: PHENYTOIN, ZONISAMIDE, LAMOTRIGINE, LEVETIRACETA  MRI Brain(Personally reviewed): MRI BRAIN WITHOUT CONTRAST 15-Feb-2024 12:35:23 PM   TECHNIQUE: Multiplanar multisequence MRI of the head/brain was performed without the administration of intravenous contrast.   COMPARISON: MRI brain 03/03/2022.   CLINICAL HISTORY: Neuro deficit, acute, stroke suspected; left side weakness, onset >12 hours ago. History of CVA.   FINDINGS:   BRAIN AND VENTRICLES: Acute infarct in the right posterior limb of the internal capsule. Background of moderate chronic small vessel disease with old infarct along the left middle frontal gyrus and old infarct in the medial aspect of the left cerebellar hemisphere. Temporoparietal predominant volume  loss. No intracranial hemorrhage. No mass. No midline shift. No  hydrocephalus. The sella is unremarkable. Normal flow voids.   ORBITS: No acute abnormality.   SINUSES AND MASTOIDS: No acute abnormality.   BONES AND SOFT TISSUES: Normal marrow signal. No acute soft tissue abnormality.   IMPRESSION: 1. Acute infarct in the posterior limb of the right internal capsule. 2. Background of moderate chronic small vessel disease with old infarcts along the left middle frontal gyrus and medial aspect of the left cerebellar hemisphere.   ASSESSMENT   ODELLE KOSIER is a 89 y.o. female with hx of DM, HTN, HLD, carotid artery occlusion and Crohns who presents with complaints of weakness.  Infarct not particularly consistent with examination findings but patient in atrial fibrillation and therefore infarct likely cardioembolic in etiology.  It is concerning that the patient's bilateral lower extremities are so weak and would rule Gomez other etiologies for her lower extremity weakness other than acute infarct.   LDL 107.  A1c pending  RECOMMENDATIONS  1. ESR, repeat CK, CRP, HgbA1c pending.  Goal A1c<7.0 2. MRI of the lumbar and cervical spines 3. PT consult, OT consult, Speech consult 4. Echocardiogram 5. Carotid dopplers pending 6. Prophylactic therapy-May start anticoagulation due to small size of infarct and presence of atrial fibrillation but would use reversible agent at this time until more clear about etiology of patient's weakness.   7. NPO until RN stroke swallow screen 8. Telemetry monitoring 9. Frequent neuro checks 10. Agree with statin initiation  ______________________________________________________________________    Signed, Zania Kalisz, MD [facility]

## 2024-02-15 ENCOUNTER — Inpatient Hospital Stay

## 2024-02-15 DIAGNOSIS — I160 Hypertensive urgency: Secondary | ICD-10-CM | POA: Diagnosis not present

## 2024-02-15 DIAGNOSIS — I480 Paroxysmal atrial fibrillation: Secondary | ICD-10-CM | POA: Diagnosis not present

## 2024-02-15 DIAGNOSIS — I4891 Unspecified atrial fibrillation: Secondary | ICD-10-CM | POA: Diagnosis not present

## 2024-02-15 DIAGNOSIS — M4802 Spinal stenosis, cervical region: Secondary | ICD-10-CM

## 2024-02-15 DIAGNOSIS — R7989 Other specified abnormal findings of blood chemistry: Secondary | ICD-10-CM | POA: Diagnosis not present

## 2024-02-15 DIAGNOSIS — N179 Acute kidney failure, unspecified: Secondary | ICD-10-CM | POA: Diagnosis not present

## 2024-02-15 DIAGNOSIS — I6340 Cerebral infarction due to embolism of unspecified cerebral artery: Secondary | ICD-10-CM | POA: Diagnosis not present

## 2024-02-15 DIAGNOSIS — W19XXXA Unspecified fall, initial encounter: Secondary | ICD-10-CM | POA: Diagnosis not present

## 2024-02-15 DIAGNOSIS — I639 Cerebral infarction, unspecified: Secondary | ICD-10-CM | POA: Diagnosis not present

## 2024-02-15 DIAGNOSIS — E785 Hyperlipidemia, unspecified: Secondary | ICD-10-CM | POA: Diagnosis not present

## 2024-02-15 LAB — COMPREHENSIVE METABOLIC PANEL WITH GFR
ALT: 22 U/L (ref 0–44)
AST: 31 U/L (ref 15–41)
Albumin: 2.6 g/dL — ABNORMAL LOW (ref 3.5–5.0)
Alkaline Phosphatase: 53 U/L (ref 38–126)
Anion gap: 13 (ref 5–15)
BUN: 38 mg/dL — ABNORMAL HIGH (ref 8–23)
CO2: 25 mmol/L (ref 22–32)
Calcium: 8.6 mg/dL — ABNORMAL LOW (ref 8.9–10.3)
Chloride: 97 mmol/L — ABNORMAL LOW (ref 98–111)
Creatinine, Ser: 1.45 mg/dL — ABNORMAL HIGH (ref 0.44–1.00)
GFR, Estimated: 35 mL/min — ABNORMAL LOW (ref >60)
Glucose, Bld: 195 mg/dL — ABNORMAL HIGH (ref 70–99)
Potassium: 3.4 mmol/L — ABNORMAL LOW (ref 3.5–5.1)
Sodium: 135 mmol/L (ref 135–145)
Total Bilirubin: 0.9 mg/dL (ref 0.0–1.2)
Total Protein: 5.9 g/dL — ABNORMAL LOW (ref 6.5–8.1)

## 2024-02-15 LAB — GLUCOSE, CAPILLARY
Glucose-Capillary: 106 mg/dL — ABNORMAL HIGH (ref 70–99)
Glucose-Capillary: 272 mg/dL — ABNORMAL HIGH (ref 70–99)
Glucose-Capillary: 242 mg/dL — ABNORMAL HIGH (ref 70–99)
Glucose-Capillary: 139 mg/dL — ABNORMAL HIGH (ref 70–99)

## 2024-02-15 LAB — CBC WITH DIFFERENTIAL/PLATELET
Abs Immature Granulocytes: 0.02 K/uL (ref 0.00–0.07)
Basophils Absolute: 0.1 K/uL (ref 0.0–0.1)
Basophils Relative: 1 %
Eosinophils Absolute: 0.1 K/uL (ref 0.0–0.5)
Eosinophils Relative: 1 %
HCT: 44 % (ref 36.0–46.0)
Hemoglobin: 14.7 g/dL (ref 12.0–15.0)
Immature Granulocytes: 0 %
Lymphocytes Relative: 13 %
Lymphs Abs: 1 K/uL (ref 0.7–4.0)
MCH: 30.6 pg (ref 26.0–34.0)
MCHC: 33.4 g/dL (ref 30.0–36.0)
MCV: 91.7 fL (ref 80.0–100.0)
Monocytes Absolute: 0.5 K/uL (ref 0.1–1.0)
Monocytes Relative: 6 %
Neutro Abs: 6.5 K/uL (ref 1.7–7.7)
Neutrophils Relative %: 79 %
Platelets: 177 K/uL (ref 150–400)
RBC: 4.8 MIL/uL (ref 3.87–5.11)
RDW: 13.2 % (ref 11.5–15.5)
WBC: 8.2 K/uL (ref 4.0–10.5)
nRBC: 0 % (ref 0.0–0.2)

## 2024-02-15 LAB — LIPID PANEL
Cholesterol: 137 mg/dL (ref 0–200)
HDL: 36 mg/dL — ABNORMAL LOW (ref >40)
LDL Cholesterol: 85 mg/dL (ref 0–99)
Total CHOL/HDL Ratio: 3.8 ratio
Triglycerides: 80 mg/dL (ref <150)
VLDL: 16 mg/dL (ref 0–40)

## 2024-02-15 LAB — HEPARIN LEVEL (UNFRACTIONATED)
Heparin Unfractionated: 0.47 [IU]/mL (ref 0.30–0.70)
Heparin Unfractionated: 0.57 [IU]/mL (ref 0.30–0.70)

## 2024-02-15 LAB — SEDIMENTATION RATE: Sed Rate: 25 mm/h (ref 0–30)

## 2024-02-15 LAB — CK: Total CK: 113 U/L (ref 38–234)

## 2024-02-15 MED ORDER — STROKE: EARLY STAGES OF RECOVERY BOOK: Freq: Once | Status: AC

## 2024-02-15 MED ORDER — CLONIDINE HCL 0.1 MG PO TABS
0.1000 mg | ORAL_TABLET | Freq: Two times a day (BID) | ORAL | Status: DC
Start: 2024-02-15 — End: 2024-02-16
  Administered 2024-02-15 – 2024-02-16 (×2): 0.1 mg via ORAL
  Filled 2024-02-15 (×2): qty 1

## 2024-02-15 MED ORDER — AMLODIPINE BESYLATE 5 MG PO TABS
5.0000 mg | ORAL_TABLET | Freq: Every day | ORAL | Status: DC
Start: 2024-02-16 — End: 2024-02-16
  Administered 2024-02-16: 5 mg via ORAL
  Filled 2024-02-15: qty 1

## 2024-02-15 NOTE — Consult Note (Signed)
 Pharmacy Consult Note - Anticoagulation  Pharmacy Consult for Heparin  drip  Indication: New Onset atrial fibrillation Allergies  Allergen Reactions   Advil [Ibuprofen] Rash    PATIENT MEASUREMENTS: Height: 5' 7 (170.2 cm) Weight: 96.4 kg (212 lb 8.4 oz) IBW/kg (Calculated) : 61.6 HEPARIN  DW (KG): 77.7  VITAL SIGNS: Temp: 97.9 F (36.6 C) (09/20 0032) Temp Source: Oral (09/19 1710) BP: 103/62 (09/20 0032) Pulse Rate: 53 (09/20 0032)  Recent Labs    02/14/24 0124 02/14/24 0555 02/14/24 1552 02/14/24 2349  HGB 15.4* 15.6*  --   --   HCT 46.0 47.2*  --   --   PLT 212 206  --   --   APTT  --   --  26  --   LABPROT  --   --  16.5*  --   INR  --   --  1.3*  --   HEPARINUNFRC  --   --   --  0.57  CREATININE 2.85* 2.51*  --   --   CKTOTAL 729*  --   --   --   TROPONINIHS 52*  --   --   --     Estimated Creatinine Clearance: 18.8 mL/min (A) (by C-G formula based on SCr of 2.51 mg/dL (H)).  PAST MEDICAL HISTORY: Past Medical History:  Diagnosis Date   Carotid artery occlusion    Crohn's disease (HCC)    Diabetes mellitus without complication (HCC)    Hyperlipemia    Hypertension    Hypokalemia 02/25/2022   Lower GI bleeding 09/13/2022    Medications:  Medications Prior to Admission  Medication Sig Dispense Refill Last Dose/Taking   amLODipine  (NORVASC ) 10 MG tablet Take 10 mg by mouth daily.   02/13/2024   aspirin  EC 81 MG tablet Take 81 mg by mouth daily. Swallow whole.   02/13/2024   calcium -vitamin D  (OSCAL 500/200 D-3) 500-200 MG-UNIT per tablet Take 1 tablet by mouth daily.   02/13/2024   cloNIDine  (CATAPRES ) 0.3 MG tablet Take 0.3 mg by mouth 2 (two) times daily.   02/13/2024   furosemide  (LASIX ) 40 MG tablet Take 40 mg by mouth daily.   02/13/2024   HYDROcodone -acetaminophen  (NORCO) 5-325 MG tablet Take 1 tablet by mouth every 6 (six) hours as needed for moderate pain. 10 tablet 0 Taking As Needed   latanoprost  (XALATAN ) 0.005 % ophthalmic solution Place 1 drop  into both eyes at bedtime.   02/12/2024   lisinopril  (ZESTRIL ) 40 MG tablet Take 40 mg by mouth daily.   02/13/2024   lovastatin (MEVACOR) 40 MG tablet Take 40 mg by mouth at bedtime.   02/13/2024   metFORMIN (GLUCOPHAGE-XR) 500 MG 24 hr tablet Take 500 mg by mouth daily.   02/13/2024   metoprolol  tartrate (LOPRESSOR ) 50 MG tablet Take 1 tablet (50 mg total) by mouth 2 (two) times daily. 60 tablet 2 02/13/2024   ONETOUCH ULTRA test strip 1 each by Other route.      Scheduled:   [START ON 02/16/2024]  stroke: early stages of recovery book   Does not apply Once   amLODipine   10 mg Oral Daily   aspirin  EC  81 mg Oral Daily   atorvastatin   40 mg Oral Daily   calcium -vitamin D   1 tablet Oral Daily   cloNIDine   0.3 mg Oral BID   insulin  aspart  0-9 Units Subcutaneous TID WC   latanoprost   1 drop Both Eyes QHS   metoprolol  tartrate  50 mg Oral BID  Infusions:   heparin  1,100 Units/hr (02/14/24 1657)   PRN: acetaminophen  **OR** acetaminophen , hydrALAZINE , HYDROcodone -acetaminophen , labetalol , magnesium  hydroxide, ondansetron  **OR** ondansetron  (ZOFRAN ) IV, traZODone   ASSESSMENT: 89 y.o. female with PMH CVA, CAD, T2DM, and HTN who is presenting with New onset Atrial Fibrillation. Patient is not on chronic anticoagulation per chart review. Pharmacy has been consulted to initiate and manage heparin  intravenous infusion.   Goal(s) of therapy: Heparin  level 0.3 - 0.7 units/mL aPTT 66 - 102 seconds Monitor platelets by anticoagulation protocol: Yes   Baseline anticoagulation labs: Recent Labs    02/13/24 2019 02/14/24 0124 02/14/24 0555 02/14/24 1552  APTT  --   --   --  26  INR  --   --   --  1.3*  HGB 14.4 15.4* 15.6*  --   PLT 223 212 206  --     0919: aPTT and INR pending  0919 2349 HL 0.57, therapeutic x 1  PLAN:  Continue heparin  infusion at 1100 units/hour.  Recheck heparin  level in 8 hours to confirm, then daily once at least two levels are consecutively therapeutic.  Monitor  CBC daily while on heparin  infusion.  Rank[place] CANDIE Dills, PharmD, [facility] 02/15/2024 12:49 AM

## 2024-02-15 NOTE — Plan of Care (Signed)
  Problem: Activity: Goal: Risk for activity intolerance will decrease Outcome: Progressing   Problem: Pain Managment: Goal: General experience of comfort will improve and/or be controlled Outcome: Progressing   Problem: Safety: Goal: Ability to remain free from injury will improve Outcome: Progressing   Problem: Skin Integrity: Goal: Risk for impaired skin integrity will decrease Outcome: Progressing   Problem: Self-Care: Goal: Ability to communicate needs accurately will improve Outcome: Progressing   Problem: Nutrition: Goal: Risk of aspiration will decrease Outcome: Progressing Goal: Dietary intake will improve Outcome: Progressing

## 2024-02-15 NOTE — Progress Notes (Signed)
 SLP Cancellation Note  Patient Details Name: Kaitlin Gomez MRN: [id number] DOB: 16-Jun-1934   Cancelled treatment:       Reason Eval/Treat Not Completed: SLP screened, no needs identified, will sign off   Orders received for cognitive communication assessment. Chart review completed. 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 Neurology note mentioning primary concern for lower extremity weakness. Pt screened for cognitive communication deficits- pt denying acute concerns. Reporting that she is at her baseline. No further SLP services indicated at this time.   [name], MS, CCC-SLP Speech Language Pathologist Rehab Services; [facility] [phone] (ascom)    [name] 02/15/2024, 10:44 AM

## 2024-02-15 NOTE — Consult Note (Signed)
 Pharmacy Consult Note - Anticoagulation  Pharmacy Consult for Heparin  drip  Indication: New Onset atrial fibrillation Allergies  Allergen Reactions   Advil [Ibuprofen] Rash    PATIENT MEASUREMENTS: Height: 5' 7 (170.2 cm) Weight: 96.4 kg (212 lb 8.4 oz) IBW/kg (Calculated) : 61.6 HEPARIN  DW (KG): 77.7  VITAL SIGNS: Temp: 98 F (36.7 C) (09/20 1150) BP: 88/49 (09/20 1150) Pulse Rate: 62 (09/20 0731)  Recent Labs    02/14/24 0124 02/14/24 0555 02/14/24 1552 02/14/24 2349 02/15/24 1112 02/15/24 1131  HGB 15.4* 15.6*  --   --   --  14.7  HCT 46.0 47.2*  --   --   --  44.0  PLT 212 206  --   --   --  177  APTT  --   --  26  --   --   --   LABPROT  --   --  16.5*  --   --   --   INR  --   --  1.3*  --   --   --   HEPARINUNFRC  --   --   --    < > 0.47  --   CREATININE 2.85* 2.51*  --   --   --   --   CKTOTAL 729*  --   --   --   --   --   TROPONINIHS 52*  --   --   --   --   --    < > = values in this interval not displayed.    Estimated Creatinine Clearance: 18.8 mL/min (A) (by C-G formula based on SCr of 2.51 mg/dL (H)).  PAST MEDICAL HISTORY: Past Medical History:  Diagnosis Date   Carotid artery occlusion    Crohn's disease (HCC)    Diabetes mellitus without complication (HCC)    Hyperlipemia    Hypertension    Hypokalemia 02/25/2022   Lower GI bleeding 09/13/2022    Medications:  Medications Prior to Admission  Medication Sig Dispense Refill Last Dose/Taking   amLODipine  (NORVASC ) 10 MG tablet Take 10 mg by mouth daily.   02/13/2024   aspirin  EC 81 MG tablet Take 81 mg by mouth daily. Swallow whole.   02/13/2024   calcium -vitamin D  (OSCAL 500/200 D-3) 500-200 MG-UNIT per tablet Take 1 tablet by mouth daily.   02/13/2024   cloNIDine  (CATAPRES ) 0.3 MG tablet Take 0.3 mg by mouth 2 (two) times daily.   02/13/2024   furosemide  (LASIX ) 40 MG tablet Take 40 mg by mouth daily.   02/13/2024   HYDROcodone -acetaminophen  (NORCO) 5-325 MG tablet Take 1 tablet by mouth  every 6 (six) hours as needed for moderate pain. 10 tablet 0 Taking As Needed   latanoprost  (XALATAN ) 0.005 % ophthalmic solution Place 1 drop into both eyes at bedtime.   02/12/2024   lisinopril  (ZESTRIL ) 40 MG tablet Take 40 mg by mouth daily.   02/13/2024   lovastatin (MEVACOR) 40 MG tablet Take 40 mg by mouth at bedtime.   02/13/2024   metFORMIN (GLUCOPHAGE-XR) 500 MG 24 hr tablet Take 500 mg by mouth daily.   02/13/2024   metoprolol  tartrate (LOPRESSOR ) 50 MG tablet Take 1 tablet (50 mg total) by mouth 2 (two) times daily. 60 tablet 2 02/13/2024   ONETOUCH ULTRA test strip 1 each by Other route.      Scheduled:   [START ON 02/16/2024]  stroke: early stages of recovery book   Does not apply Once   amLODipine   10  mg Oral Daily   aspirin  EC  81 mg Oral Daily   atorvastatin   40 mg Oral Daily   calcium -vitamin D   1 tablet Oral Daily   cloNIDine   0.3 mg Oral BID   insulin  aspart  0-9 Units Subcutaneous TID WC   latanoprost   1 drop Both Eyes QHS   metoprolol  tartrate  50 mg Oral BID   Infusions:   heparin  1,100 Units/hr (02/15/24 0332)   PRN: acetaminophen  **OR** acetaminophen , hydrALAZINE , HYDROcodone -acetaminophen , labetalol , magnesium  hydroxide, ondansetron  **OR** ondansetron  (ZOFRAN ) IV, traZODone   ASSESSMENT: 89 y.o. female with PMH CVA, CAD, T2DM, and HTN who is presenting with New onset Atrial Fibrillation. Patient is not on chronic anticoagulation per chart review. Pharmacy has been consulted to initiate and manage heparin  intravenous infusion.  0919 2349 HL 0.57, therapeutic x 1 0920 1112 HL 0.47    Goal(s) of therapy: Heparin  level 0.3 - 0.7 units/mL aPTT 66 - 102 seconds Monitor platelets by anticoagulation protocol: Yes   Baseline anticoagulation labs: Recent Labs    02/14/24 0124 02/14/24 0555 02/14/24 1552 02/15/24 1131  APTT  --   --  26  --   INR  --   --  1.3*  --   HGB 15.4* 15.6*  --  14.7  PLT 212 206  --  177    PLAN: Heparin  level is therapeutic. Will  continue heparin  infusion at 1100 units/hr. Recheck heparin  level  and CBC with AM labs.   Cathaleen Blanch, PharmD, BCPS 02/15/2024 12:15 PM

## 2024-02-15 NOTE — Consult Note (Addendum)
 [facility] Vascular Consult Note  MRN : [id number]  Kaitlin Gomez is a 89 y.o. ([birth year]) female who presents with chief complaint of  Chief Complaint  Patient presents with   Fall  .  History of Present Illness: She presents with progressive bilateral lower extremity weakness and possibly new left arm weakness.  She has a history of A. Fib not on anticoagulation and a severe LICA stenosis.  Imaging on admission includes a brain MRI with acute right internal capsule infarct, Carotid Duplex with mild RICA and severe LICA stenosis (similar to 2 years ago with the left ICA velocity lower), MRI of C-spine with severe multilevel stenosis and Lumbar spine with mild stenosis.  To my exam she keeps falling asleep but does answer questions accurately and with no speech issues.  Current Facility-Administered Medications  Medication Dose Route Frequency Provider Last Rate Last Admin   [START ON 02/16/2024]  stroke: early stages of recovery book   Does not apply Once Mansy, Jan A, MD       acetaminophen  (TYLENOL ) tablet 650 mg  650 mg Oral Q6H PRN Mansy, Jan A, MD       Or   acetaminophen  (TYLENOL ) suppository 650 mg  650 mg Rectal Q6H PRN Mansy, Jan A, MD       amLODipine  (NORVASC ) tablet 10 mg  10 mg Oral Daily Mansy, Jan A, MD   10 mg at 02/15/24 0856   aspirin  EC tablet 81 mg  81 mg Oral Daily Mansy, Jan A, MD   81 mg at 02/15/24 0856   atorvastatin  (LIPITOR) tablet 40 mg  40 mg Oral Daily Dezii, Alexandra, DO   40 mg at 02/15/24 9143   calcium -vitamin D  (OSCAL WITH D) 500-5 MG-MCG per tablet 1 tablet  1 tablet Oral Daily Mansy, Jan A, MD   1 tablet at 02/15/24 0856   cloNIDine  (CATAPRES ) tablet 0.3 mg  0.3 mg Oral BID Mansy, Jan A, MD   0.3 mg at 02/15/24 0856   heparin  ADULT infusion 100 units/mL (25000 units/250mL)  1,100 Units/hr Intravenous Continuous Dezii, Alexandra, DO 11 mL/hr at 02/15/24 0332 1,100 Units/hr at 02/15/24 0332   hydrALAZINE  (APRESOLINE ) injection  10 mg  10 mg Intravenous Q6H PRN Mansy, Jan A, MD   10 mg at 02/14/24 0334   HYDROcodone -acetaminophen  (NORCO/VICODIN) 5-325 MG per tablet 1 tablet  1 tablet Oral Q6H PRN Mansy, Jan A, MD       insulin  aspart (novoLOG ) injection 0-9 Units  0-9 Units Subcutaneous TID WC Dezii, Alexandra, DO   5 Units at 02/15/24 1252   labetalol  (NORMODYNE ) injection 20 mg  20 mg Intravenous Q3H PRN Mansy, Jan A, MD       latanoprost  (XALATAN ) 0.005 % ophthalmic solution 1 drop  1 drop Both Eyes QHS Mansy, Jan A, MD   1 drop at 02/15/24 0334   magnesium  hydroxide (MILK OF MAGNESIA) suspension 30 mL  30 mL Oral Daily PRN Mansy, Jan A, MD       metoprolol  tartrate (LOPRESSOR ) tablet 50 mg  50 mg Oral BID Mansy, Jan A, MD   50 mg at 02/15/24 9143   ondansetron  (ZOFRAN ) tablet 4 mg  4 mg Oral Q6H PRN Mansy, Jan A, MD       Or   ondansetron  (ZOFRAN ) injection 4 mg  4 mg Intravenous Q6H PRN Mansy, Jan A, MD       traZODone  (DESYREL ) tablet 25 mg  25 mg Oral QHS PRN Mansy,  Madison LABOR, MD        Past Medical History:  Diagnosis Date   Carotid artery occlusion    Crohn's disease (HCC)    Diabetes mellitus without complication (HCC)    Hyperlipemia    Hypertension    Hypokalemia 02/25/2022   Lower GI bleeding 09/13/2022    Past Surgical History:  Procedure Laterality Date   COLONOSCOPY WITH PROPOFOL  N/A 01/05/2015   Procedure: COLONOSCOPY WITH PROPOFOL ;  Surgeon: Gladis RAYMOND Mariner, MD;  Location: [facility] END[facility]COPY;  Service: Endoscopy;  Laterality: N/A;   COLONOSCOPY WITH PROPOFOL  N/A 11/02/2016   Procedure: COLONOSCOPY WITH PROPOFOL ;  Surgeon: Therisa Bi, MD;  Location: [facility] ENDOSCOPY;  Service: Endoscopy;  Laterality: N/A;   COLONOSCOPY WITH PROPOFOL  N/A 09/14/2022   Procedure: COLONOSCOPY WITH PROPOFOL ;  Surgeon: Jinny Carmine, MD;  Location: [facility] ENDOSCOPY;  Service: Endoscopy;  Laterality: N/A;   IR ANGIOGRAM SELECTIVE EACH ADDITIONAL VESSEL  02/25/2022   IR ANGIOGRAM SELECTIVE EACH ADDITIONAL VESSEL  02/25/2022   IR  ANGIOGRAM SELECTIVE EACH ADDITIONAL VESSEL  02/25/2022   IR ANGIOGRAM VISCERAL SELECTIVE  02/25/2022   IR EMBO ART  VEN HEMORR LYMPH EXTRAV  INC GUIDE ROADMAPPING  02/25/2022   IR US  GUIDE VASC ACCESS RIGHT  02/25/2022    Social History Social History   Tobacco Use   Smoking status: Never   Smokeless tobacco: Never  Vaping Use   Vaping status: Never Used  Substance Use Topics   Alcohol use: No   Drug use: No    Family History Family History  Problem Relation Age of Onset   Hypertension Father     Allergies  Allergen Reactions   Advil [Ibuprofen] Rash     REVIEW OF SYSTEMS (Negative unless checked)  Constitutional: [] Weight loss  [] Fever  [] Chills Cardiac: [] Chest pain   [] Chest pressure   [] Palpitations   [] Shortness of breath when laying flat   [] Shortness of breath at rest   [] Shortness of breath with exertion. Vascular:  [] Pain in legs with walking   [] Pain in legs at rest   [] Pain in legs when laying flat   [] Claudication   [] Pain in feet when walking  [] Pain in feet at rest  [] Pain in feet when laying flat   [] History of DVT   [] Phlebitis   [x] Swelling in legs   [] Varicose veins   [] Non-healing ulcers Pulmonary:   [] Uses home oxygen   [] Productive cough   [] Hemoptysis   [] Wheeze  [] COPD   [] Asthma Neurologic:  [] Dizziness  [] Blackouts   [] Seizures   [] History of stroke   [] History of TIA  [] Aphasia   [] Temporary blindness   [] Dysphagia   [] Weakness or numbness in arms   [x] Weakness or numbness in legs Musculoskeletal:  [] Arthritis   [] Joint swelling   [] Joint pain   [] Low back pain Hematologic:  [] Easy bruising  [] Easy bleeding   [] Hypercoagulable state   [] Anemic  [] Hepatitis Gastrointestinal:  [] Blood in stool   [] Vomiting blood  [] Gastroesophageal reflux/heartburn   [] Difficulty swallowing. Genitourinary:  [] Chronic kidney disease   [] Difficult urination  [] Frequent urination  [] Burning with urination   [] Blood in urine Skin:  [] Rashes   [] Ulcers    [] Wounds Psychological:  [] History of anxiety   []  History of major depression.  Physical Examination  Vitals:   02/15/24 0503 02/15/24 0731 02/15/24 0856 02/15/24 1150  BP: (!) 153/83 (!) 141/81 (!) 141/81 (!) 88/49  Pulse: 73 62    Resp: 14 18  18   Temp: 97.7 F (36.5 C)  98.1 F (36.7 C)  98 F (36.7 C)  TempSrc:      SpO2: 99% 100%  98%  Weight:      Height:       Body mass index is 33.29 kg/m. Gen:  WD/WN, obese, NAD, sleepy Head: Hor[place]/AT, No temporalis wasting. Prominent temp pulse not noted.  Holds neck stiff with head pointed down and to left (like torticolis) and I am unable to straighten Ear/Nose/Throat: Hearing grossly intact, nares w/o erythema or drainage, oropharynx w/o Erythema/Exudate Eyes: Sclera non-icteric, conjunctiva clear Neck: Trachea midline.  No JVD.  Pulmonary:  Good air movement, respirations not labored, equal bilaterally.  Cardiac: RRR, normal S1, S2. Vascular:  Vessel Right Left  Radial Palpable Palpable  Ulnar Palpable Palpable  Brachial Palpable Palpable  Carotid Palpable, without bruit Palpable, without bruit  Aorta Not palpable N/A  Femoral Palpable Palpable  Popliteal Palpable Palpable  PT Non-Palpable  DS + Non-Palpable  DS +  DP Non-Palpable Non-Palpable  DS +   Gastrointestinal: soft, non-tender/non-distended. No guarding/reflex.  Musculoskeletal: M/S RUE 5/5 LUE 4/5  RLE 2/5  LLE 1.5/5  No LE proximal motor but toes move Bilarterlly.  Extremities without ischemic changes.  Marked lymphedema of both legs and feet.  Neurologic: Sensation grossly intact in extremities.  Symmetrical.  Speech is fluent. Motor exam as listed above. Psychiatric: Judgment intact, Mood & affect appropriate for pt's clinical situation. Dermatologic: No rashes or ulcers noted.  No cellulitis or open wounds. Lymph : No Cervical, Axillary, or Inguinal lymphadenopathy.     CBC Lab Results  Component Value Date   WBC 8.2 02/15/2024   HGB 14.7 02/15/2024    HCT 44.0 02/15/2024   MCV 91.7 02/15/2024   PLT 177 02/15/2024    BMET    Component Value Date/Time   NA 135 02/15/2024 1131   K 3.4 (L) 02/15/2024 1131   CL 97 (L) 02/15/2024 1131   CO2 25 02/15/2024 1131   GLUCOSE 195 (H) 02/15/2024 1131   BUN 38 (H) 02/15/2024 1131   CREATININE 1.45 (H) 02/15/2024 1131   CALCIUM  8.6 (L) 02/15/2024 1131   GFRNONAA 35 (L) 02/15/2024 1131   GFRAA >60 11/02/2016 0508   Estimated Creatinine Clearance: 32.6 mL/min (A) (by C-G formula based on SCr of 1.45 mg/dL (H)).  COAG Lab Results  Component Value Date   INR 1.3 (H) 02/14/2024   INR 1.2 09/13/2022   INR 1.2 02/25/2022    Radiology US  Carotid Bilateral Result Date: 02/15/2024 CLINICAL DATA:  89 year old female with neurologic deficit, acute lacunar infarct posterior limb right internal capsule on brain MRI. EXAM: BILATERAL CAROTID DUPLEX ULTRASOUND TECHNIQUE: Elnor scale imaging, color Doppler and duplex ultrasound were performed of bilateral carotid and vertebral arteries in the neck. COMPARISON:  Brain MRI 02/14/2024.  CTA neck 03/07/2022. FINDINGS: Criteria: Quantification of carotid stenosis is based on velocity parameters that correlate the residual internal carotid diameter with NASCET-based stenosis levels, using the diameter of the distal internal carotid lumen as the denominator for stenosis measurement. The following velocity measurements were obtained: RIGHT ICA: 65 cm/sec CCA: 77 cm/sec SYSTOLIC ICA/CCA RATIO:  0.8 ECA: 90 cm/sec LEFT ICA: 216 cm/sec CCA: 52 cm/sec SYSTOLIC ICA/CCA RATIO:  4.1 ECA: 65 cm/sec RIGHT CAROTID ARTERY: Shadowing echogenic calcified plaque, appearing similar to the prior CTA. RIGHT VERTEBRAL ARTERY:  Patent with antegrade flow detected. LEFT CAROTID ARTERY: Bulky plaque with high-grade stenosis. This was seen to be approaching a radiographic string sign on the 2023 CTA (numerically estimated  at 80% at that time, series 5, image 39 of that exam). Pronounced Spectral  broadening on this exam (images 57, 60). LEFT VERTEBRAL ARTERY:  Patent with antegrade flow detected. IMPRESSION: 1. Positive for Very Severe Atherosclerotic Stenosis of the proximal Left ICA, was numerically estimated at 80% by CTA in 2023, could be Near Occlusive now. Recommend Vascular Surgery consultation. 2. Mild contralateral right carotid bifurcation atherosclerosis, probably not significantly changed from prior CTA. Electronically Signed   By: VEAR Hurst M.D.   On: 02/15/2024 05:19   MR LUMBAR SPINE WO CONTRAST Result Date: 02/15/2024 EXAM: MRI LUMBAR SPINE 02/15/2024 02:51:22 AM TECHNIQUE: Multiplanar multisequence MRI of the lumbar spine was performed without the administration of intravenous contrast. COMPARISON: None available. CLINICAL HISTORY: Myelopathy, acute, lumbar spine. FINDINGS: BONES AND ALIGNMENT: Normal alignment. Normal vertebral body heights. Bone marrow signal is unremarkable. SPINAL CORD: The conus terminates normally. SOFT TISSUES: No paraspinal mass. L1-L2: No significant disc herniation. No spinal canal stenosis or neural foraminal narrowing. L2-L3: Small disc bulge and mild facet hypertrophy with narrowing of the lateral recesses. No central spinal canal or neural foraminal stenosis. L3-L4: Small disc bulge and mild facet hypertrophy. Mild left foraminal stenosis. L4-L5: Small central disc protrusion. L5-S1: Intermediate size disc bulge with endplate spurring and narrowing of the lateral recesses. Mild central spinal canal stenosis. Mild bilateral foraminal stenosis. IMPRESSION: 1. L5-S1 mild spinal canal stenosis and mild bilateral neural foraminal stenosis 2.  L3-4 mild left foraminal stenosis Electronically signed by: Franky Stanford MD 02/15/2024 03:20 AM EDT RP Workstation: [id number]   MR CERVICAL SPINE WO CONTRAST Result Date: 02/15/2024 EXAM: MRI CERVICAL SPINE WITHOUT CONTRAST 02/15/2024 02:51:22 AM TECHNIQUE: Multiplanar multisequence MRI of the cervical spine was performed.  COMPARISON: CT from 01/23/2015. CLINICAL HISTORY: Myelopathy, acute, cervical spine. FINDINGS: BONES AND ALIGNMENT: Normal alignment. Normal vertebral body heights. Bone marrow signal is unremarkable. SPINAL CORD: There is faint hyperintense T2 weighted signal within the spinal cord at C4-C5. SOFT TISSUES: No paraspinal mass. C2-C3: Small central disc protrusion and mild left facet hypertrophy. No central spinal canal or neural foraminal stenosis. C3-C4: Small disc bulge with bilateral uncovertebral hypertrophy. Severe bilateral neuroforaminal stenosis. C4-C5: Large disc osteophyte complex with severe spinal canal stenosis and compression of the spinal cord. Severe bilateral foraminal stenosis. Compared to the CT from 01/23/2015, the degree of stenosis is likely unchanged. C5-C6: Disc space narrowing with uncovertebral hypertrophy causing severe bilateral foraminal stenosis. C6-C7: Left greater than right uncovertebral hypertrophy with mild right and moderate left foraminal stenosis. C7-T1: No significant disc herniation. No spinal canal stenosis or neural foraminal narrowing. IMPRESSION: 1. C4-5 severe spinal canal stenosis and compression of the spinal cord with severe bilateral neuroforaminal stenosis. Allowing for differences in modality, this is likely unchanged from 01/23/2015. 2. C5-6 severe bilateral foraminal stenosis 3. C3-4 severe bilateral neural foraminal stenosis 4. C6-7 moderate left neural foraminal stenosis Electronically signed by: Franky Stanford MD 02/15/2024 03:11 AM EDT RP Workstation: [id number]   ECHOCARDIOGRAM COMPLETE Result Date: 02/14/2024    ECHOCARDIOGRAM REPORT   Patient Name:   MADELINA SANDA Date of Exam: 02/14/2024 Medical Rec #:  [id number]      Height:       67.0 in Accession #:    [id number]     Weight:       212.5 lb Date of Birth:  1935-03-11      BSA:          2.075 m Patient Age:    74 years  BP:           132/63 mmHg Patient Gender: F              HR:           58 bpm.  Exam Location:  [facility] Procedure: 2D Echo, Cardiac Doppler and Color Doppler (Both Spectral and Color            Flow Doppler were utilized during procedure). STAT ECHO Indications:     Abnormal ECG R94.31  History:         Patient has prior history of Echocardiogram examinations, most                  recent 02/26/2022. Arrythmias:Atrial Fibrillation.  Sonographer:     Ashley McNeely-Sloane Referring Phys:  [id number] ALEXANDRA DEZII Diagnosing Phys: Timothy Gollan MD IMPRESSIONS  1. Left ventricular ejection fraction, by estimation, is 50 to 55%. The left ventricle has low normal function. The left ventricle demonstrates regional wall motion abnormalities (hypokinesis of the basal inferior wall). There is moderate left ventricular hypertrophy. Left ventricular diastolic parameters are indeterminate.  2. Right ventricular systolic function is normal. The right ventricular size is normal. Tricuspid regurgitation signal is inadequate for assessing PA pressure.  3. Left atrial size was mildly dilated.  4. The mitral valve is normal in structure. Mild mitral valve regurgitation. No evidence of mitral stenosis.  5. The aortic valve has an indeterminant number of cusps. Aortic valve regurgitation is not visualized. No aortic stenosis is present.  6. The inferior vena cava is normal in size with greater than 50% respiratory variability, suggesting right atrial pressure of 3 mmHg. FINDINGS  Left Ventricle: Left ventricular ejection fraction, by estimation, is 50 to 55%. The left ventricle has low normal function. The left ventricle demonstrates regional wall motion abnormalities. Strain was performed and the global longitudinal strain is indeterminate. The left ventricular internal cavity size was normal in size. There is moderate left ventricular hypertrophy. Left ventricular diastolic parameters are indeterminate. Right Ventricle: The right ventricular size is normal. No increase in right ventricular wall thickness. Right  ventricular systolic function is normal. Tricuspid regurgitation signal is inadequate for assessing PA pressure. Left Atrium: Left atrial size was mildly dilated. Right Atrium: Right atrial size was normal in size. Pericardium: There is no evidence of pericardial effusion. Mitral Valve: The mitral valve is normal in structure. Mild mitral annular calcification. Mild mitral valve regurgitation. No evidence of mitral valve stenosis. MV peak gradient, 4.4 mmHg. The mean mitral valve gradient is 1.0 mmHg. Tricuspid Valve: The tricuspid valve is normal in structure. Tricuspid valve regurgitation is mild . No evidence of tricuspid stenosis. Aortic Valve: The aortic valve has an indeterminant number of cusps. Aortic valve regurgitation is not visualized. No aortic stenosis is present. Aortic valve mean gradient measures 1.0 mmHg. Aortic valve peak gradient measures 2.6 mmHg. Aortic valve area, by VTI measures 2.06 cm. Pulmonic Valve: The pulmonic valve was normal in structure. Pulmonic valve regurgitation is not visualized. No evidence of pulmonic stenosis. Aorta: The aortic root is normal in size and structure. Venous: The inferior vena cava is normal in size with greater than 50% respiratory variability, suggesting right atrial pressure of 3 mmHg. IAS/Shunts: No atrial level shunt detected by color flow Doppler. Additional Comments: 3D was performed not requiring image post processing on an independent workstation and was indeterminate.  LEFT VENTRICLE PLAX 2D LVIDd:         3.90 cm   Diastology LVIDs:  3.40 cm   LV e' medial:    5.55 cm/s LV PW:         2.00 cm   LV E/e' medial:  16.1 LV IVS:        1.30 cm   LV e' lateral:   10.10 cm/s LVOT diam:     1.80 cm   LV E/e' lateral: 8.9 LV SV:         29 LV SV Index:   14 LVOT Area:     2.54 cm  RIGHT VENTRICLE RV S prime:     8.70 cm/s TAPSE (M-mode): 1.9 cm LEFT ATRIUM             Index        RIGHT ATRIUM           Index LA diam:        4.40 cm 2.12 cm/m   RA  Area:     14.00 cm LA Vol (A2C):   59.7 ml 28.77 ml/m  RA Volume:   27.30 ml  13.16 ml/m LA Vol (A4C):   82.0 ml 39.52 ml/m LA Biplane Vol: 69.9 ml 33.68 ml/m  AORTIC VALVE                    PULMONIC VALVE AV Area (Vmax):    2.11 cm     PV Vmax:        0.96 m/s AV Area (Vmean):   1.90 cm     PV Vmean:       67.600 cm/s AV Area (VTI):     2.06 cm     PV VTI:         0.177 m AV Vmax:           80.60 cm/s   PV Peak grad:   3.7 mmHg AV Vmean:          55.600 cm/s  PV Mean grad:   2.0 mmHg AV VTI:            0.142 m      RVOT Peak grad: 2 mmHg AV Peak Grad:      2.6 mmHg AV Mean Grad:      1.0 mmHg LVOT Vmax:         66.90 cm/s LVOT Vmean:        41.500 cm/s LVOT VTI:          0.115 m LVOT/AV VTI ratio: 0.81  AORTA Ao Root diam: 2.90 cm Ao Asc diam:  2.50 cm MITRAL VALVE MV Area (PHT): 3.39 cm    SHUNTS MV Area VTI:   1.23 cm    Systemic VTI:  0.12 m MV Peak grad:  4.4 mmHg    Systemic Diam: 1.80 cm MV Mean grad:  1.0 mmHg    Pulmonic VTI:  0.120 m MV Vmax:       1.05 m/s MV Vmean:      50.8 cm/s MV Decel Time: 224 msec MV E velocity: 89.60 cm/s Evalene Lunger MD Electronically signed by Evalene Lunger MD Signature Date/Time: 02/14/2024/1:22:43 PM    Final    MR BRAIN WO CONTRAST Result Date: 02/14/2024 EXAM: MRI BRAIN WITHOUT CONTRAST 02/14/2024 12:35:23 PM TECHNIQUE: Multiplanar multisequence MRI of the head/brain was performed without the administration of intravenous contrast. COMPARISON: MRI brain 03/03/2022. CLINICAL HISTORY: Neuro deficit, acute, stroke suspected; left side weakness, onset >12 hours ago. History of CVA. FINDINGS: BRAIN AND VENTRICLES: Acute infarct in the right posterior limb of  the internal capsule. Background of moderate chronic small vessel disease with old infarct along the left middle frontal gyrus and old infarct in the medial aspect of the left cerebellar hemisphere. Temporoparietal predominant volume loss. No intracranial hemorrhage. No mass. No midline shift. No  hydrocephalus. The sella is unremarkable. Normal flow voids. ORBITS: No acute abnormality. SINUSES AND MASTOIDS: No acute abnormality. BONES AND SOFT TISSUES: Normal marrow signal. No acute soft tissue abnormality. IMPRESSION: 1. Acute infarct in the posterior limb of the right internal capsule. 2. Background of moderate chronic small vessel disease with old infarcts along the left middle frontal gyrus and medial aspect of the left cerebellar hemisphere. Electronically signed by: Ryan Chess MD 02/14/2024 12:40 PM EDT RP Workstation: [id number]   US  Renal Result Date: 02/14/2024 CLINICAL DATA:  Renal failure EXAM: RENAL / URINARY TRACT ULTRASOUND COMPLETE COMPARISON:  09/13/2018 FINDINGS: Right Kidney: Renal measurements: 10.8 x 5.0 x 4.8 cm. = volume: 135 mL. Simple cyst is noted measuring up to 1.3 cm. No follow-up is recommended. Mild increased echogenicity is noted. No cortical thinning is seen. Left Kidney: Renal measurements: 10.6 x 6.3 x 4.6 cm. = volume: 161 mL. No mass lesion or hydronephrosis is noted. 10 mm echogenic focus is noted consistent with nonobstructing stone. This is stable from the prior CT. Mild increased echogenicity is noted. Bladder: Appears normal for degree of bladder distention. Other: None. IMPRESSION: Changes consistent with medical renal disease. 10 mm nonobstructing left renal stone. No other focal abnormality is noted. Electronically Signed   By: Oneil Devonshire M.D.   On: 02/14/2024 02:13   DG Chest Portable 1 View Result Date: 02/13/2024 CLINICAL DATA:  Fall injury.  Polytrauma. EXAM: PORTABLE CHEST 1 VIEW PORTABLE PELVIS 1 VIEW COMPARISON:  Portable chest 03/03/2022, CTA abdomen and pelvis 09/13/2022. FINDINGS: Chest AP portable 11:02 p.m.: There is overlying material and telemetry leads. The lungs are mildly emphysematous but clear. There is mild chronic elevation of the right hemidiaphragm. The sulci are sharp. Mild cardiomegaly. There is a normal mediastinal  configuration. Calcification of the transverse aorta. Thoracic cage is intact. Moderate thoracic spondylosis and bridging enthesopathy. Osteopenia. Portable AP pelvis: No pelvic fracture or diastasis is seen. No other focal bone lesions. Osteopenia. Symmetric moderate to advanced bilateral hip arthrosis with axial and superior joint space loss and slight acetabular protrusio with acetabular and femoral head osteophytes. Patchy calcification both common femoral arteries. Otherwise unremarkable soft tissues. Postsurgical change again noted right lower abdominal quadrant. IMPRESSION: 1. No evidence of acute chest disease.  Stable COPD chest. 2. Mild cardiomegaly. 3. No evidence of pelvic fracture or diastasis. 4. Osteopenia and degenerative change. 5. Aortic atherosclerosis. 6. Moderate to advanced bilateral hip arthrosis. Electronically Signed   By: Francis Quam M.D.   On: 02/13/2024 23:23   DG Pelvis 1-2 Views Result Date: 02/13/2024 CLINICAL DATA:  Fall injury.  Polytrauma. EXAM: PORTABLE CHEST 1 VIEW PORTABLE PELVIS 1 VIEW COMPARISON:  Portable chest 03/03/2022, CTA abdomen and pelvis 09/13/2022. FINDINGS: Chest AP portable 11:02 p.m.: There is overlying material and telemetry leads. The lungs are mildly emphysematous but clear. There is mild chronic elevation of the right hemidiaphragm. The sulci are sharp. Mild cardiomegaly. There is a normal mediastinal configuration. Calcification of the transverse aorta. Thoracic cage is intact. Moderate thoracic spondylosis and bridging enthesopathy. Osteopenia. Portable AP pelvis: No pelvic fracture or diastasis is seen. No other focal bone lesions. Osteopenia. Symmetric moderate to advanced bilateral hip arthrosis with axial and superior joint space loss and slight  acetabular protrusio with acetabular and femoral head osteophytes. Patchy calcification both common femoral arteries. Otherwise unremarkable soft tissues. Postsurgical change again noted right lower  abdominal quadrant. IMPRESSION: 1. No evidence of acute chest disease.  Stable COPD chest. 2. Mild cardiomegaly. 3. No evidence of pelvic fracture or diastasis. 4. Osteopenia and degenerative change. 5. Aortic atherosclerosis. 6. Moderate to advanced bilateral hip arthrosis. Electronically Signed   By: Francis Quam M.D.   On: 02/13/2024 23:23    I have called family but only got an answer form a brother in [facility] who has not seen her in 6 months.  The Brother who saw her today did not answer.  I left a message.  Assessment/Plan 1. Acute R Internal capsule infarct with mild RICA disease 2. Severe LICA disease with reduced velocity in comparison to 2 years ago, likely currently ASX.. 3. Severe Cervical spine stenosis with bilateral LE weakness (L slightly worse than R) 4. Severe cervical spine stiffness  Anticoagulation for history of A fib on no anticoagulation with R internal capsule infarct likely from A fib and not carotid.  NSU evaluation for spine disease as potential explanation for BILATERAL severe motor weakness  Recommend CTA of neck and head for further clarification of extent/severity of carotid disease and ASA (anti-platelet Rx) and statin.   Norleen LITTIE Pereyra, MD  02/15/2024 1:07 PM  Addendum:  After discussion with Hospitalist the patient would refuse any intervention and has a resolving AKI.  These factors make pursuit of evaluation of the carotids superfluous as well as the Vascular Consult.  We will sign off.  Norleen Pereyra MD

## 2024-02-15 NOTE — Evaluation (Addendum)
 Occupational Therapy Evaluation Patient Details Name: Kaitlin Gomez MRN: [id number] DOB: May 04, 1935 Today's Date: 02/15/2024   History of Present Illness   Pt. is an 89 y.o. African-American female with medical history significant for type 2 diabetes mellitus, hypertension, dyslipidemia and chron's disease as well as carotid artery stenosis, who presented to the emergency room with acute onset of fall.     Clinical Impressions Pt. presents with lethargy, weakness, limited activity tolerance, and limited functional mobility which hinders her ability to complete basic ADL and IADL functioning. Pt. resides at home with her son.  Pt. reports being independent with ADLs, and IADL functioning: including meal preparation, and medication management prior to hospitalization. Pt. was very lethargic, and reports being exhausted.  Pt. requires MaxA ADL tasks.  Lethargy is impacting performance this morning. Pt. reports having a history of limitations in the right shoulder. Pt. will benefit from OT services for ADL training, A/E training, UE there. Ex. and Pt./caregiver education about home modification, and DME.      If plan is discharge home, recommend the following:   A lot of help with walking and/or transfers;A lot of help with bathing/dressing/bathroom;Assistance with cooking/housework;Direct supervision/assist for medications management     Functional Status Assessment         Equipment Recommendations   BSC/3in1     Recommendations for Other Services         Precautions/Restrictions         Mobility Bed Mobility               General bed mobility comments: Deferred 2/2 lethary    Transfers                   General transfer comment: Deferred      Balance                                           ADL either performed or assessed with clinical judgement   ADL    MaxA ADL tasks: Engagement and performance is limited by  lethargy.                                           Vision  Pt. Reports no change from baseline       Perception         Praxis         Pertinent Vitals/Pain Pain Assessment Pain Assessment: No/denies pain     Extremity/Trunk Assessment   History of right shoulder limitations           Communication Communication Communication: No apparent difficulties   Cognition Arousal: Lethargic Behavior During Therapy:  (Lethargic) Cognition: Difficult to assess                               Following commands: Intact       Cueing  General Comments   Cueing Techniques: Verbal cues;Tactile cues;Visual cues      Exercises     Shoulder Instructions      Home Living Family/patient expects to be discharged to:: Private residence Living Arrangements: Children Available Help at Discharge: Family;Available 24 hours/day Type of Home: House Home Access: Level entry  Bathroom Shower/Tub: Chief Strategy Officer: Standard     Home Equipment: Cane - single Librarian, academic (2 wheels)          Prior Functioning/Environment Prior Level of Function : Independent/Modified Independent;History of Falls (last six months)             Mobility Comments: uses cane at baseline, fell prior to arrival. Gilmer is in rehab currently. ADLs Comments: Independent with ADLs at baseline per Pt. report    OT Problem List: Decreased strength;Decreased range of motion;Decreased activity tolerance;Decreased knowledge of use of DME or AE;Impaired UE functional use   OT Treatment/Interventions: Self-care/ADL training;Therapeutic exercise;Neuromuscular education;DME and/or AE instruction;Patient/family education;Therapeutic activities      OT Goals(Current goals can be found in the care plan section)   Acute Rehab OT Goals Patient Stated Goal: To get her strength back OT Goal Formulation: With patient Time For Goal  Achievement: 02/15/24 Potential to Achieve Goals: Good   OT Frequency:  Min 1X/week    Co-evaluation              AM-PAC OT 6 Clicks Daily Activity     Outcome Measure Help from another person eating meals?: A Little Help from another person taking care of personal grooming?: A Lot Help from another person toileting, which includes using toliet, bedpan, or urinal?: A Lot Help from another person bathing (including washing, rinsing, drying)?: A Lot Help from another person to put on and taking off regular upper body clothing?: A Lot Help from another person to put on and taking off regular lower body clothing?: A Lot 6 Click Score: 13   End of Session Equipment Utilized During Treatment: Gait belt  Activity Tolerance: Patient tolerated treatment well Patient left: in bed;in chair  OT Visit Diagnosis: Unsteadiness on feet (R26.81)                Time: 1100-1114 OT Time Calculation (min): 14 min Charges:  OT General Charges $OT Visit: 1 Visit OT Evaluation $OT Eval Low Complexity: 1 Low  Richardson Otter, MS, OTR/L   Richardson Otter 02/15/2024, 1:12 PM

## 2024-02-15 NOTE — Progress Notes (Signed)
 Progress Note  Patient Name: Kaitlin Gomez Date of Encounter: 02/15/2024  Primary Cardiologist: O'Neal   Subjective   No chest pain, dyspnea, palpitations, dizziness, presyncope, or syncope. Remains in Afib with controlled ventricular response. Tired this morning. Under increased family stress.   Inpatient Medications    Scheduled Meds:  [START ON 02/16/2024]  stroke: early stages of recovery book   Does not apply Once   amLODipine   10 mg Oral Daily   aspirin  EC  81 mg Oral Daily   atorvastatin   40 mg Oral Daily   calcium -vitamin D   1 tablet Oral Daily   cloNIDine   0.3 mg Oral BID   insulin  aspart  0-9 Units Subcutaneous TID WC   latanoprost   1 drop Both Eyes QHS   metoprolol  tartrate  50 mg Oral BID   Continuous Infusions:  heparin  1,100 Units/hr (02/15/24 0332)   PRN Meds: acetaminophen  **OR** acetaminophen , hydrALAZINE , HYDROcodone -acetaminophen , labetalol , magnesium  hydroxide, ondansetron  **OR** ondansetron  (ZOFRAN ) IV, traZODone    Vital Signs    Vitals:   02/14/24 2107 02/15/24 0032 02/15/24 0503 02/15/24 0731  BP: 128/79 103/62 (!) 153/83 (!) 141/81  Pulse: 71 (!) 53 73 62  Resp: 19  14 18   Temp: 98 F (36.7 C) 97.9 F (36.6 C) 97.7 F (36.5 C) 98.1 F (36.7 C)  TempSrc:      SpO2: 99% 99% 99% 100%  Weight:      Height:        Intake/Output Summary (Last 24 hours) at 02/15/2024 0744 Last data filed at 02/14/2024 1434 Gross per 24 hour  Intake 764.36 ml  Output --  Net 764.36 ml   Filed Weights   02/13/24 2018 02/14/24 0151  Weight: 79.4 kg 96.4 kg    Telemetry    Afib with ventricular rates in the 70s to 80s bpm - Personally Reviewed  ECG    No new tracings - Personally Reviewed  Physical Exam   GEN: No acute distress.   Neck: No JVD. Cardiac: IRIR, no murmurs, rubs, or gallops.  Respiratory: Clear to auscultation bilaterally.  GI: Soft, nontender, non-distended.   MS: No edema; No deformity. Neuro:  Alert and oriented x 3. Psych:  Normal affect.  Labs    Chemistry Recent Labs  Lab 02/13/24 2019 02/14/24 0124 02/14/24 0555  NA 139  --  140  K 3.9  --  3.3*  CL 102  --  100  CO2 23  --  25  GLUCOSE 149*  --  136*  BUN 54*  --  49*  CREATININE 3.30* 2.85* 2.51*  CALCIUM  9.3  --  9.3  PROT 7.2  --   --   ALBUMIN 3.5  --   --   AST 30  --   --   ALT 21  --   --   ALKPHOS 58  --   --   BILITOT 1.3*  --   --   GFRNONAA 13* 16* 18*  ANIONGAP 14  --  15     Hematology Recent Labs  Lab 02/13/24 2019 02/14/24 0124 02/14/24 0555  WBC 10.6* 11.3* 10.5  RBC 4.73 5.13* 5.19*  HGB 14.4 15.4* 15.6*  HCT 43.7 46.0 47.2*  MCV 92.4 89.7 90.9  MCH 30.4 30.0 30.1  MCHC 33.0 33.5 33.1  RDW 13.6 13.5 13.3  PLT 223 212 206    Cardiac EnzymesNo results for input(s): TROPONINI in the last 168 hours. No results for input(s): TROPIPOC in the last 168 hours.  BNP Recent Labs  Lab 02/13/24 2019  BNP 39.8     DDimer No results for input(s): DDIMER in the last 168 hours.   Radiology    US  Carotid Bilateral Result Date: 02/15/2024 IMPRESSION: 1. Positive for Very Severe Atherosclerotic Stenosis of the proximal Left ICA, was numerically estimated at 80% by CTA in 2023, could be Near Occlusive now. Recommend Vascular Surgery consultation. 2. Mild contralateral right carotid bifurcation atherosclerosis, probably not significantly changed from prior CTA. Electronically Signed   By: VEAR Hurst M.D.   On: 02/15/2024 05:19   MR LUMBAR SPINE WO CONTRAST Result Date: 02/15/2024 IMPRESSION: 1. L5-S1 mild spinal canal stenosis and mild bilateral neural foraminal stenosis 2.  L3-4 mild left foraminal stenosis Electronically signed by: Franky Stanford MD 02/15/2024 03:20 AM EDT RP Workstation: [id number]   MR CERVICAL SPINE WO CONTRAST Result Date: 02/15/2024 IMPRESSION: 1. C4-5 severe spinal canal stenosis and compression of the spinal cord with severe bilateral neuroforaminal stenosis. Allowing for differences in  modality, this is likely unchanged from 01/23/2015. 2. C5-6 severe bilateral foraminal stenosis 3. C3-4 severe bilateral neural foraminal stenosis 4. C6-7 moderate left neural foraminal stenosis Electronically signed by: Franky Stanford MD 02/15/2024 03:11 AM EDT RP Workstation: [id number]    MR BRAIN WO CONTRAST Result Date: 02/14/2024 IMPRESSION: 1. Acute infarct in the posterior limb of the right internal capsule. 2. Background of moderate chronic small vessel disease with old infarcts along the left middle frontal gyrus and medial aspect of the left cerebellar hemisphere. Electronically signed by: Jaiyla Granados Chess MD 02/14/2024 12:40 PM EDT RP Workstation: [id number]   US  Renal Result Date: 02/14/2024 IMPRESSION: Changes consistent with medical renal disease. 10 mm nonobstructing left renal stone. No other focal abnormality is noted. Electronically Signed   By: Oneil Devonshire M.D.   On: 02/14/2024 02:13   DG Chest Portable 1 View Result Date: 02/13/2024 IMPRESSION: 1. No evidence of acute chest disease.  Stable COPD chest. 2. Mild cardiomegaly. 3. No evidence of pelvic fracture or diastasis. 4. Osteopenia and degenerative change. 5. Aortic atherosclerosis. 6. Moderate to advanced bilateral hip arthrosis. Electronically Signed   By: Francis Quam M.D.   On: 02/13/2024 23:23   DG Pelvis 1-2 Views Result Date: 02/13/2024 IMPRESSION: 1. No evidence of acute chest disease.  Stable COPD chest. 2. Mild cardiomegaly. 3. No evidence of pelvic fracture or diastasis. 4. Osteopenia and degenerative change. 5. Aortic atherosclerosis. 6. Moderate to advanced bilateral hip arthrosis. Electronically Signed   By: Francis Quam M.D.   On: 02/13/2024 23:23    Cardiac Studies   2D echo 02/14/2024: 1. Left ventricular ejection fraction, by estimation, is 50 to 55%. The  left ventricle has low normal function. The left ventricle demonstrates  regional wall motion abnormalities (hypokinesis of the basal inferior   wall). There is moderate left  ventricular hypertrophy. Left ventricular diastolic parameters are  indeterminate.   2. Right ventricular systolic function is normal. The right ventricular  size is normal. Tricuspid regurgitation signal is inadequate for assessing  PA pressure.   3. Left atrial size was mildly dilated.   4. The mitral valve is normal in structure. Mild mitral valve  regurgitation. No evidence of mitral stenosis.   5. The aortic valve has an indeterminant number of cusps. Aortic valve  regurgitation is not visualized. No aortic stenosis is present.   6. The inferior vena cava is normal in size with greater than 50%  respiratory variability, suggesting right atrial pressure of 3 mmHg.  __________   2D echo 02/26/2022: 1. Left ventricular ejection fraction, by estimation, is 60 to 65%. The  left ventricle has normal function. The left ventricle has no regional  wall motion abnormalities. There is mild left ventricular hypertrophy.  Left ventricular diastolic parameters  are consistent with Grade I diastolic dysfunction (impaired relaxation).   2. Right ventricular systolic function is normal. The right ventricular  size is normal. There is normal pulmonary artery systolic pressure. The  estimated right ventricular systolic pressure is 26.2 mmHg.   3. The mitral valve is degenerative. No evidence of mitral valve  regurgitation. Moderate mitral annular calcification.   4. The aortic valve was not well visualized. Aortic valve regurgitation  is not visualized. No aortic stenosis is present.   Patient Profile     89 y.o. female with history of diverticulosis complicated by lower GI bleeding in 02/2022 s/p embolization with recurrent bleed in 2024, possible Crohn's disease, recurrent CVA, severe left-sided carotid artery stenosis, DM2, HTN, and HLD who was admitted to [facility] on 02/13/2024 with acute CVA complicated by acute renal failure and newly diagnosed A-fib, and is being  seen today for the evaluation of newly diagnosed A-fib at the request of Dr. Leesa.   Assessment & Plan    1. Newly diagnosed A-fib with RVR:  - Presented to [facility] in sinus rhythm with development of A-fib with RVR at 19:15 a.m. 02/14/2024 and remains in rate controlled A-fib since with rates in the 60s to 70s bpm - Lopressor  50 mg twice daily  - For now, in the setting of acute CVA, anticipate rate control strategy with recommendation to pursue outpatient DCCV after she has been adequately anticoagulated without interruption for a minimum of 3 to 4 weeks - If however she has difficult to control ventricular rates or is symptomatic with ambulation while working with PT/OT, would need to pursue TEE guided DCCV prior to discharge (hopefully this can be avoided given recent CVA) - CHA2DS2-VASc at least 8 (HTN, age x 2, CVA x 2, DM, vascular disease, sex category)  - Currently on heparin  drip, okay by neurology, recommend transitioning to DOAC when it is clear she will not require inpatient invasive procedures - Patient does have a history of recurrent GI bleed, if she redevelops GI bleed on anticoagulation, would recommend EP evaluation for consideration of Watchman  - TSH normal - Magnesium  normal - Labs pending this morning  2. Elevated troponin:  - Never with symptoms of angina - Mildly elevated and flat trending, not consistent with ACS  - Likely supply/demand ischemia in the setting of CVA and rhabdomyolysis from a mechanical fall 2 days prior to admission complicated by acute renal failure  - Patient currently on heparin  drip given diagnosis of A-fib  - Echo this admission with low normal LV systolic function with hypokinesis of the basal inferior wall  - Patient not currently coronary CTA or cardiac cath candidate with acute renal failure  - Consider outpatient ischemic testing given lack of anginal symptoms and in the context of acute CVA   3. Recurrent CVA:  - Patient on heparin  drip   - Neurology consulted  - Currently on aspirin  and statin   4. Carotid artery stenosis:  - At time of CVA in 2023 she was found to have high-grade left ICA stenosis of approximately 80%, recommend updating   - Carotid artery ultrasound this admission with very severe atherosclerotic stenosis of the proximal left ICA nearly occlusive (contralateral to CVA) - Recommend vascular  surgery consult  - Aspirin  and statin for now   5. AKI:  - Admission creatinine 3.3 with baseline around 0.8-0.9  - Improving to 2.51  - Pending this morning - Cannot exclude hypertensive renal disease  - Avoid nephrotoxic agents  - Further management per internal medicine   6. Accelerated hypertension:  - Blood pressure largely stable - Currently on amlodipine  10 mg, clonidine  0.3 mg twice daily, and   - Lopressor  50 mg twice daily   7. Recurrent GI bleed:  - Denies symptoms for active bleeding  - Hemoglobin stable   8. Hypokalemia:  - Replete to goal 4.0  - Labs pending  9. HLD:  - Lipid panel pending  - Atorvastatin  40 mg   10. Preoperative cardiac risk stratification: - Known significant left ICA stenosis has progressed by ultrasound this admission - Vascular consult pending - Patient without symptoms of angina or cardiac decompensation - Echo this admission with low normal LV systolic function - Per RCRI, there is a 5% estimated rate of adverse cardiac event in the perioperative timeframe  - No further cardiac testing or intervention would reduce perioperative risk       For questions or updates, please contact [facility] Please consult [email or web address] for contact info under Cardiology/STEMI.    Signed, Bernardino Bring, PA-C  [facility] Pager: [phone] 02/15/2024, 7:44 AM

## 2024-02-15 NOTE — Progress Notes (Signed)
 BP 88/49, P 56.  Pt asleep but easily aroused and answering questions.  Denies dizziness, states she is tired. MD notified.  Will continue to monitor.

## 2024-02-15 NOTE — Progress Notes (Signed)
 NEUROLOGY CONSULT FOLLOW UP NOTE   Date of service: February 15, 2024 Patient Name: Kaitlin Gomez MRN:  [id number] DOB:  December 23, 1934  Interval Hx/subjective   No new complaints.  Continued LE weakness  Vitals   Vitals:   02/15/24 0032 02/15/24 0503 02/15/24 0731 02/15/24 0856  BP: 103/62 (!) 153/83 (!) 141/81 (!) 141/81  Pulse: (!) 53 73 62   Resp:  14 18   Temp: 97.9 F (36.6 C) 97.7 F (36.5 C) 98.1 F (36.7 C)   TempSrc:      SpO2: 99% 99% 100%   Weight:      Height:         Body mass index is 33.29 kg/m.  Physical Exam   Constitutional: Appears well-developed and well-nourished. Generalized weakness Psych: Affect appropriate to situation.  Eyes: No scleral injection.  Head: Normocephalic.  Respiratory: Effort normal, non-labored breathing.  GI: Soft.  No distension. There is no tenderness.  Extremities: BLE edema  Neurologic Examination   Mental Status: Alert, oriented, thought content appropriate.  Speech fluent without evidence of aphasia.  Able to follow 3 step commands without difficulty. Cranial Nerves: II: Visual fields grossly normal III,IV, VI: ptosis not present, extra-ocular motions intact bilaterally V,VII: smile symmetric, facial light touch sensation normal bilaterally VIII: hearing normal bilaterally XI: bilateral shoulder shrug XII: midline tongue extension Motor: Right :Upper extremity   4/5                                      Left:     Upper extremity   5-/5             Lower extremity   2/5                                                  Lower extremity   2/5 Tone and bulk:normal tone throughout; no atrophy noted Sensory: Pinprick and light touch intact throughout, bilaterally Deep Tendon Reflexes: Symmetric throughout Plantars: Right: mute                              Left: mute  Medications  Current Facility-Administered Medications:    [START ON 02/16/2024]  stroke: early stages of recovery book, , Does not apply, Once, Mansy,  Jan A, MD   acetaminophen  (TYLENOL ) tablet 650 mg, 650 mg, Oral, Q6H PRN **OR** acetaminophen  (TYLENOL ) suppository 650 mg, 650 mg, Rectal, Q6H PRN, Mansy, Jan A, MD   amLODipine  (NORVASC ) tablet 10 mg, 10 mg, Oral, Daily, Mansy, Jan A, MD, 10 mg at 02/15/24 0856   aspirin  EC tablet 81 mg, 81 mg, Oral, Daily, Mansy, Jan A, MD, 81 mg at 02/15/24 0856   atorvastatin  (LIPITOR) tablet 40 mg, 40 mg, Oral, Daily, Dezii, Alexandra, DO, 40 mg at 02/15/24 9143   calcium -vitamin D  (OSCAL WITH D) 500-5 MG-MCG per tablet 1 tablet, 1 tablet, Oral, Daily, Mansy, Jan A, MD, 1 tablet at 02/15/24 0856   cloNIDine  (CATAPRES ) tablet 0.3 mg, 0.3 mg, Oral, BID, Mansy, Jan A, MD, 0.3 mg at 02/15/24 0856   heparin  ADULT infusion 100 units/mL (25000 units/250mL), 1,100 Units/hr, Intravenous, Continuous, Dezii, Alexandra, DO, Last Rate: 11 mL/hr at 02/15/24 0332, 1,100 Units/hr at 02/15/24  9667   hydrALAZINE  (APRESOLINE ) injection 10 mg, 10 mg, Intravenous, Q6H PRN, Mansy, Jan A, MD, 10 mg at 02/14/24 0334   HYDROcodone -acetaminophen  (NORCO/VICODIN) 5-325 MG per tablet 1 tablet, 1 tablet, Oral, Q6H PRN, Mansy, Jan A, MD   insulin  aspart (novoLOG ) injection 0-9 Units, 0-9 Units, Subcutaneous, TID WC, Dezii, Alexandra, DO   labetalol  (NORMODYNE ) injection 20 mg, 20 mg, Intravenous, Q3H PRN, Mansy, Jan A, MD   latanoprost  (XALATAN ) 0.005 % ophthalmic solution 1 drop, 1 drop, Both Eyes, QHS, Mansy, Jan A, MD, 1 drop at 02/15/24 9665   magnesium  hydroxide (MILK OF MAGNESIA) suspension 30 mL, 30 mL, Oral, Daily PRN, Mansy, Jan A, MD   metoprolol  tartrate (LOPRESSOR ) tablet 50 mg, 50 mg, Oral, BID, Mansy, Jan A, MD, 50 mg at 02/15/24 9143   ondansetron  (ZOFRAN ) tablet 4 mg, 4 mg, Oral, Q6H PRN **OR** ondansetron  (ZOFRAN ) injection 4 mg, 4 mg, Intravenous, Q6H PRN, Mansy, Jan A, MD   traZODone  (DESYREL ) tablet 25 mg, 25 mg, Oral, QHS PRN, Mansy, Madison LABOR, MD  Labs and Diagnostic Imaging   CBC:  Recent Labs  Lab 02/14/24 0124  02/14/24 0555  WBC 11.3* 10.5  HGB 15.4* 15.6*  HCT 46.0 47.2*  MCV 89.7 90.9  PLT 212 206    Basic Metabolic Panel:  Lab Results  Component Value Date   NA 140 02/14/2024   K 3.3 (L) 02/14/2024   CO2 25 02/14/2024   GLUCOSE 136 (H) 02/14/2024   BUN 49 (H) 02/14/2024   CREATININE 2.51 (H) 02/14/2024   CALCIUM  9.3 02/14/2024   GFRNONAA 18 (L) 02/14/2024   GFRAA >60 11/02/2016   Lipid Panel:  Lab Results  Component Value Date   LDLCALC 107 (H) 02/14/2024   HgbA1c:  Lab Results  Component Value Date   HGBA1C 6.0 (H) 02/14/2024   Urine Drug Screen: No results found for: LABOPIA, COCAINSCRNUR, LABBENZ, AMPHETMU, THCU, LABBARB  Alcohol Level No results found for: [facility] INR  Lab Results  Component Value Date   INR 1.3 (H) 02/14/2024   APTT  Lab Results  Component Value Date   APTT 26 02/14/2024   AED levels: No results found for: PHENYTOIN, ZONISAMIDE, LAMOTRIGINE, LEVETIRACETA  Carotid Duplex BL((Personally reviewed): BILATERAL CAROTID DUPLEX ULTRASOUND   TECHNIQUE: Elnor scale imaging, color Doppler and duplex ultrasound were performed of bilateral carotid and vertebral arteries in the neck.   COMPARISON:  Brain MRI 02/14/2024.  CTA neck 03/07/2022.   FINDINGS: Criteria: Quantification of carotid stenosis is based on velocity parameters that correlate the residual internal carotid diameter with NASCET-based stenosis levels, using the diameter of the distal internal carotid lumen as the denominator for stenosis measurement.   The following velocity measurements were obtained:   RIGHT   ICA: 65 cm/sec   CCA: 77 cm/sec   SYSTOLIC ICA/CCA RATIO:  0.8   ECA: 90 cm/sec   LEFT   ICA: 216 cm/sec   CCA: 52 cm/sec   SYSTOLIC ICA/CCA RATIO:  4.1   ECA: 65 cm/sec   RIGHT CAROTID ARTERY: Shadowing echogenic calcified plaque, appearing similar to the prior CTA.   RIGHT VERTEBRAL ARTERY:  Patent with antegrade flow detected.    LEFT CAROTID ARTERY: Bulky plaque with high-grade stenosis. This was seen to be approaching a radiographic string sign on the 2023 CTA (numerically estimated at 80% at that time, series 5, image 39 of that exam). Pronounced Spectral broadening on this exam (images 57, 60).   LEFT VERTEBRAL ARTERY:  Patent with antegrade  flow detected.   IMPRESSION: 1. Positive for Very Severe Atherosclerotic Stenosis of the proximal Left ICA, was numerically estimated at 80% by CTA in 2023, could be Near Occlusive now. Recommend Vascular Surgery consultation.   2. Mild contralateral right carotid bifurcation atherosclerosis, probably not significantly changed from prior CTA.    MR Cervical Spine (Personally reviewed): MRI CERVICAL SPINE WITHOUT CONTRAST 02/15/2024 02:51:22 AM   TECHNIQUE: Multiplanar multisequence MRI of the cervical spine was performed.   COMPARISON: CT from 01/23/2015.   CLINICAL HISTORY: Myelopathy, acute, cervical spine.   FINDINGS:   BONES AND ALIGNMENT: Normal alignment. Normal vertebral body heights. Bone marrow signal is unremarkable.   SPINAL CORD: There is faint hyperintense T2 weighted signal within the spinal cord at C4-C5.   SOFT TISSUES: No paraspinal mass.   C2-C3: Small central disc protrusion and mild left facet hypertrophy. No central spinal canal or neural foraminal stenosis.   C3-C4: Small disc bulge with bilateral uncovertebral hypertrophy. Severe bilateral neuroforaminal stenosis.   C4-C5: Large disc osteophyte complex with severe spinal canal stenosis and compression of the spinal cord. Severe bilateral foraminal stenosis. Compared to the CT from 01/23/2015, the degree of stenosis is likely unchanged.   C5-C6: Disc space narrowing with uncovertebral hypertrophy causing severe bilateral foraminal stenosis.   C6-C7: Left greater than right uncovertebral hypertrophy with mild right and moderate left foraminal stenosis.   C7-T1: No  significant disc herniation. No spinal canal stenosis or neural foraminal narrowing.   IMPRESSION: 1. C4-5 severe spinal canal stenosis and compression of the spinal cord with severe bilateral neuroforaminal stenosis. Allowing for differences in modality, this is likely unchanged from 01/23/2015. 2. C5-6 severe bilateral foraminal stenosis 3. C3-4 severe bilateral neural foraminal stenosis 4. C6-7 moderate left neural foraminal stenosis  MRI Lumbar Spine(Personally reviewed): MRI LUMBAR SPINE 02/15/2024 02:51:22 AM   TECHNIQUE: Multiplanar multisequence MRI of the lumbar spine was performed without the administration of intravenous contrast.   COMPARISON: None available.   CLINICAL HISTORY: Myelopathy, acute, lumbar spine.   FINDINGS:   BONES AND ALIGNMENT: Normal alignment. Normal vertebral body heights. Bone marrow signal is unremarkable.   SPINAL CORD: The conus terminates normally.   SOFT TISSUES: No paraspinal mass.   L1-L2: No significant disc herniation. No spinal canal stenosis or neural foraminal narrowing.   L2-L3: Small disc bulge and mild facet hypertrophy with narrowing of the lateral recesses. No central spinal canal or neural foraminal stenosis.   L3-L4: Small disc bulge and mild facet hypertrophy. Mild left foraminal stenosis.   L4-L5: Small central disc protrusion.   L5-S1: Intermediate size disc bulge with endplate spurring and narrowing of the lateral recesses. Mild central spinal canal stenosis. Mild bilateral foraminal stenosis.   IMPRESSION: 1. L5-S1 mild spinal canal stenosis and mild bilateral neural foraminal stenosis 2.  L3-4 mild left foraminal stenosis    Assessment   CLORENE NERIO is a 89 y.o. female with hx of DM, HTN, HLD, carotid artery occlusion and Crohns who presents with complaints of weakness.  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  Infarct not particularly consistent with examination  findings but patient in atrial fibrillation and therefore infarct likely cardioembolic in etiology.  Patient on heparin .  Carotid dopplers show 80% LICA stenosis.  Patient asymptomatic from this finding on this presentation.  It is concerning that the patient's bilateral lower extremities are so weak.  Again not consistent with MIR findings.  MRI of the cervical spine does show  severe spinal stenosis at C4-5 with cord compression.  This may be contributing to patient's BLE weakness which seems to have been progressive over some time.  Treatment options discussed with patient and she is not interested in surgical evaluation at this time.   LDL 107.  A1c 6.0.  Echocardiogram shows no cardiac source of emboli, EF 50-55%.    Recommendations  Vascular consult Cardiology following patient and patient on heparin . May go to [facility] when appropriate per neurology standpoint. Telemetry Neuro checks PT/OT ______________________________________________________________________   Signed, Lennix Kneisel, MD [facility]

## 2024-02-15 NOTE — Progress Notes (Signed)
 PROGRESS NOTE    [name]  FMW:[id number] DOB: [birth year] DOA: 02/13/2024 PCP: [place] HERO, MD  Chief Complaint  Patient presents with   [facility] Course:  Kaitlin Gomez is an 89 year old female with prior CVA, CAD, type 2 diabetes, hypertension, dyslipidemia, carotid disease, known coronary artery stenosis, who presents to the emergency department in which she woke up and felt weak in both legs.  She reports she tried to walk but was unable to do so. On arrival to ED BP was 145/59, heart rate 56, creatinine 3.3, CKD 729.  High-sensitivity troponin 39 with repeat at 52.  EKG revealed NSR, QTc 49, rate 75.  CXR and pelvic x-ray without acute process.  Renal ultrasound revealed changes consistent with medical renal disease and a 10 mm obstructing left renal stone with no other focal abnormalities.  She was given IV fluid bolus and admitted for management.  Subjective: No acute events overnight.  Patient denies any pain this morning.  Does endorse continued weakness.  Objective: Vitals:   02/15/24 0503 02/15/24 0731 02/15/24 0856 02/15/24 1150  BP: (!) 153/83 (!) 141/81 (!) 141/81 (!) 88/49  Pulse: 73 62    Resp: 14 18  18   Temp: 97.7 F (36.5 C) 98.1 F (36.7 C)  98 F (36.7 C)  TempSrc:      SpO2: 99% 100%  98%  Weight:      Height:        Intake/Output Summary (Last 24 hours) at 02/15/2024 1358 Last data filed at 02/15/2024 0900 Gross per 24 hour  Intake 884.36 ml  Output 550 ml  Net 334.36 ml   Filed Weights   02/13/24 2018 02/14/24 0151  Weight: 79.4 kg 96.4 kg    Examination: General exam: Appears calm and comfortable, NAD  Respiratory system: No work of breathing, symmetric chest wall expansion Cardiovascular system: S1 & S2 heard, RRR.  Gastrointestinal system: Abdomen is nondistended, soft and nontender.  Neuro: Alert and oriented.  Drowsy but easily arousable Skin: No rashes, lesions Psychiatry: Demonstrates appropriate judgement and insight.  Mood & affect appropriate for situation.   Assessment & Plan:  Principal Problem:   AKI (acute kidney injury) (HCC) Active Problems:   Hypertensive urgency   Fall at home, initial encounter   Dyslipidemia   Type 2 diabetes mellitus without complications (HCC)   Weakness   Atrial fibrillation with RVR (HCC)   Acute stroke due to ischemia [facility])   PAD (peripheral artery disease) (HCC)   Stenosis of carotid artery    Acute posterior CVA - Left upper extremity weakness, profound lower extremity weakness. - Brain MRI ordered: Acute infarct in posterior limb of right internal capsule.  Patient is 24 hours out from event. - Echocardiogram: EF 50 to 55%, hypokinesis of the basal inferior wall with moderate left ventricular hypertrophy.  Diastolic parameters indeterminate. - LDL 107, statin initiated - Hemoglobin A1c at goal 6.0% - Continue PT/OT while admitted - On telemetry  Lower extremity weakness - Greater than expected given stroke findings.  Cervical spine MRI reveals spinal stenosis at C4-5 with cord compression though this is chronic.  Suspect her weakness is multifactorial from new CVA and chronic spinal stenosis. - Patient has declined surgical intervention with neurosurgery or vascular surgery. - Continue with PT/OT - Would benefit from SNF placement - TOC consulted to assist in [facility] arrangements.  Coronary artery stenosis, left severe - Known prior to arrival, but is consistently less than 80% so is  being followed outpatient - Carotid duplex this admission: Mild right ICA and severe left ICA stenosis, possibly representing complete occlusion - Vascular surgery was consulted and has recommended CTA however patient refuses surgical intervention even if offered.  Will discontinue further workup and proceed with aspirin , statin, DOAC as above  History of CVA - 2023 patient was diagnosed with bilateral small embolic infarcts thought to be cardioembolic though atrial fibrillation  was not diagnosed at that time.  She was determined not to be a candidate for anticoagulation due to recent GI bleed and anemia.  She did receive loop recorder during that time.  New onset atrial fibrillation, with RVR - Patient denies history of this.  Did have cardiac monitor in the past and there was concern of possible A-fib in the past from likely cardioembolic CVA 2023 - Did have RVR this admission which resolved with one-time push metoprolol  - Currently rate controlled on Lopressor  - Cardiology consulted, no plans to restore NSR given recent CVA - Continue heparin  drip in favor of DOAC - Cardiology to consider ischemic workup outpatient - Echo with preserved Ef, hypokinesis of the basal anterior wall with moderate LVH. - TSH WNL  Hypertensive urgency ruled out - Blood pressure overnight 9/19 reported to be severely elevated.  This was blood pressure cuff issue.  When measured accurately blood pressures have been normotensive.  Reading drip was initially ordered but not needed.  Hypertension - Blood pressure has been low today.  Decreasing antihypertensives.  Normotensive blood pressure goals.  AKI - Baseline creatinine appears to be 0.88 from 1 year ago.  Creatinine 3.3 on arrival with elevated BUN. - Likely prerenal given poor p.o. intake and downtime.  Also likely some degree of ATN given mild rhabdomyolysis and possible hypotension at home with A-fib RVR. - Creatinine continues to resolved. - Continue monitoring CMP - Renally dosed with a creatinine clearance of 32 when needed  Rhabdomyolysis, nontraumatic - Secondary to prolonged downtime.  Resolved.  10 mm nonobstructing left renal stone - Aware.  No flank pain currently.  Stone is nonobstructive.  She is currently on IV fluids, will continue.  Monitor creatinine.  Type 2 diabetes - Hemoglobin A1c 6.0% - No indication for sliding scale insulin  at this time.  DVT prophylaxis: Lovenox    Code Status: Full  Code Disposition: Nearing medical readiness for discharge.  Will need SNF.  TOC consulted  Consultants:  Treatment Team:  Consulting Physician: Germaine Raring, MD Consulting Physician: Perla Evalene PARAS, MD  Procedures:    Antimicrobials:  Anti-infectives (From admission, onward)    None       Data Reviewed: I have personally reviewed following labs and imaging studies CBC: Recent Labs  Lab 02/13/24 2019 02/14/24 0124 02/14/24 0555 02/15/24 1131  WBC 10.6* 11.3* 10.5 8.2  NEUTROABS  --   --   --  6.5  HGB 14.4 15.4* 15.6* 14.7  HCT 43.7 46.0 47.2* 44.0  MCV 92.4 89.7 90.9 91.7  PLT 223 212 206 177   Basic Metabolic Panel: Recent Labs  Lab 02/13/24 2019 02/14/24 0124 02/14/24 0555 02/14/24 1552 02/15/24 1131  NA 139  --  140  --  135  K 3.9  --  3.3*  --  3.4*  CL 102  --  100  --  97*  CO2 23  --  25  --  25  GLUCOSE 149*  --  136*  --  195*  BUN 54*  --  49*  --  38*  CREATININE  3.30* 2.85* 2.51*  --  1.45*  CALCIUM  9.3  --  9.3  --  8.6*  MG  --   --   --  1.7  --    GFR: Estimated Creatinine Clearance: 32.6 mL/min (A) (by C-G formula based on SCr of 1.45 mg/dL (H)). Liver Function Tests: Recent Labs  Lab 02/13/24 2019 02/15/24 1131  AST 30 31  ALT 21 22  ALKPHOS 58 53  BILITOT 1.3* 0.9  PROT 7.2 5.9*  ALBUMIN 3.5 2.6*   CBG: Recent Labs  Lab 02/14/24 1706 02/14/24 2110 02/15/24 0732 02/15/24 1152  GLUCAP 151* 110* 106* 272*    No results found for this or any previous visit (from the past 240 hours).   Radiology Studies: US  Carotid Bilateral Result Date: 02/15/2024 CLINICAL DATA:  89 year old female with neurologic deficit, acute lacunar infarct posterior limb right internal capsule on brain MRI. EXAM: BILATERAL CAROTID DUPLEX ULTRASOUND TECHNIQUE: Elnor scale imaging, color Doppler and duplex ultrasound were performed of bilateral carotid and vertebral arteries in the neck. COMPARISON:  Brain MRI 02/14/2024.  CTA neck 03/07/2022.  FINDINGS: Criteria: Quantification of carotid stenosis is based on velocity parameters that correlate the residual internal carotid diameter with NASCET-based stenosis levels, using the diameter of the distal internal carotid lumen as the denominator for stenosis measurement. The following velocity measurements were obtained: RIGHT ICA: 65 cm/sec CCA: 77 cm/sec SYSTOLIC ICA/CCA RATIO:  0.8 ECA: 90 cm/sec LEFT ICA: 216 cm/sec CCA: 52 cm/sec SYSTOLIC ICA/CCA RATIO:  4.1 ECA: 65 cm/sec RIGHT CAROTID ARTERY: Shadowing echogenic calcified plaque, appearing similar to the prior CTA. RIGHT VERTEBRAL ARTERY:  Patent with antegrade flow detected. LEFT CAROTID ARTERY: Bulky plaque with high-grade stenosis. This was seen to be approaching a radiographic string sign on the 2023 CTA (numerically estimated at 80% at that time, series 5, image 39 of that exam). Pronounced Spectral broadening on this exam (images 57, 60). LEFT VERTEBRAL ARTERY:  Patent with antegrade flow detected. IMPRESSION: 1. Positive for Very Severe Atherosclerotic Stenosis of the proximal Left ICA, was numerically estimated at 80% by CTA in 2023, could be Near Occlusive now. Recommend Vascular Surgery consultation. 2. Mild contralateral right carotid bifurcation atherosclerosis, probably not significantly changed from prior CTA. Electronically Signed   By: VEAR Hurst M.D.   On: 02/15/2024 05:19   MR LUMBAR SPINE WO CONTRAST Result Date: 02/15/2024 EXAM: MRI LUMBAR SPINE 02/15/2024 02:51:22 AM TECHNIQUE: Multiplanar multisequence MRI of the lumbar spine was performed without the administration of intravenous contrast. COMPARISON: None available. CLINICAL HISTORY: Myelopathy, acute, lumbar spine. FINDINGS: BONES AND ALIGNMENT: Normal alignment. Normal vertebral body heights. Bone marrow signal is unremarkable. SPINAL CORD: The conus terminates normally. SOFT TISSUES: No paraspinal mass. L1-L2: No significant disc herniation. No spinal canal stenosis or neural  foraminal narrowing. L2-L3: Small disc bulge and mild facet hypertrophy with narrowing of the lateral recesses. No central spinal canal or neural foraminal stenosis. L3-L4: Small disc bulge and mild facet hypertrophy. Mild left foraminal stenosis. L4-L5: Small central disc protrusion. L5-S1: Intermediate size disc bulge with endplate spurring and narrowing of the lateral recesses. Mild central spinal canal stenosis. Mild bilateral foraminal stenosis. IMPRESSION: 1. L5-S1 mild spinal canal stenosis and mild bilateral neural foraminal stenosis 2.  L3-4 mild left foraminal stenosis Electronically signed by: Franky Stanford MD 02/15/2024 03:20 AM EDT RP Workstation: [id number]   MR CERVICAL SPINE WO CONTRAST Result Date: 02/15/2024 EXAM: MRI CERVICAL SPINE WITHOUT CONTRAST 02/15/2024 02:51:22 AM TECHNIQUE: Multiplanar multisequence MRI of the cervical  spine was performed. COMPARISON: CT from 01/23/2015. CLINICAL HISTORY: Myelopathy, acute, cervical spine. FINDINGS: BONES AND ALIGNMENT: Normal alignment. Normal vertebral body heights. Bone marrow signal is unremarkable. SPINAL CORD: There is faint hyperintense T2 weighted signal within the spinal cord at C4-C5. SOFT TISSUES: No paraspinal mass. C2-C3: Small central disc protrusion and mild left facet hypertrophy. No central spinal canal or neural foraminal stenosis. C3-C4: Small disc bulge with bilateral uncovertebral hypertrophy. Severe bilateral neuroforaminal stenosis. C4-C5: Large disc osteophyte complex with severe spinal canal stenosis and compression of the spinal cord. Severe bilateral foraminal stenosis. Compared to the CT from 01/23/2015, the degree of stenosis is likely unchanged. C5-C6: Disc space narrowing with uncovertebral hypertrophy causing severe bilateral foraminal stenosis. C6-C7: Left greater than right uncovertebral hypertrophy with mild right and moderate left foraminal stenosis. C7-T1: No significant disc herniation. No spinal canal stenosis or  neural foraminal narrowing. IMPRESSION: 1. C4-5 severe spinal canal stenosis and compression of the spinal cord with severe bilateral neuroforaminal stenosis. Allowing for differences in modality, this is likely unchanged from 01/23/2015. 2. C5-6 severe bilateral foraminal stenosis 3. C3-4 severe bilateral neural foraminal stenosis 4. C6-7 moderate left neural foraminal stenosis Electronically signed by: Franky Stanford MD 02/15/2024 03:11 AM EDT RP Workstation: [id number]   ECHOCARDIOGRAM COMPLETE Result Date: 02/14/2024    ECHOCARDIOGRAM REPORT   Patient Name:   PEIGHTON MEHRA Date of Exam: 02/14/2024 Medical Rec #:  [id number]      Height:       67.0 in Accession #:    [id number]     Weight:       212.5 lb Date of Birth:  12/22/1934      BSA:          2.075 m Patient Age:    89 years       BP:           132/63 mmHg Patient Gender: F              HR:           58 bpm. Exam Location:  [facility] Procedure: 2D Echo, Cardiac Doppler and Color Doppler (Both Spectral and Color            Flow Doppler were utilized during procedure). STAT ECHO Indications:     Abnormal ECG R94.31  History:         Patient has prior history of Echocardiogram examinations, most                  recent 02/26/2022. Arrythmias:Atrial Fibrillation.  Sonographer:     Ashley McNeely-Sloane Referring Phys:  [id number] Malasia Torain Diagnosing Phys: Timothy Gollan MD IMPRESSIONS  1. Left ventricular ejection fraction, by estimation, is 50 to 55%. The left ventricle has low normal function. The left ventricle demonstrates regional wall motion abnormalities (hypokinesis of the basal inferior wall). There is moderate left ventricular hypertrophy. Left ventricular diastolic parameters are indeterminate.  2. Right ventricular systolic function is normal. The right ventricular size is normal. Tricuspid regurgitation signal is inadequate for assessing PA pressure.  3. Left atrial size was mildly dilated.  4. The mitral valve is normal in structure. Mild mitral  valve regurgitation. No evidence of mitral stenosis.  5. The aortic valve has an indeterminant number of cusps. Aortic valve regurgitation is not visualized. No aortic stenosis is present.  6. The inferior vena cava is normal in size with greater than 50% respiratory variability, suggesting right atrial pressure of 3 mmHg. FINDINGS  Left Ventricle:  Left ventricular ejection fraction, by estimation, is 50 to 55%. The left ventricle has low normal function. The left ventricle demonstrates regional wall motion abnormalities. Strain was performed and the global longitudinal strain is indeterminate. The left ventricular internal cavity size was normal in size. There is moderate left ventricular hypertrophy. Left ventricular diastolic parameters are indeterminate. Right Ventricle: The right ventricular size is normal. No increase in right ventricular wall thickness. Right ventricular systolic function is normal. Tricuspid regurgitation signal is inadequate for assessing PA pressure. Left Atrium: Left atrial size was mildly dilated. Right Atrium: Right atrial size was normal in size. Pericardium: There is no evidence of pericardial effusion. Mitral Valve: The mitral valve is normal in structure. Mild mitral annular calcification. Mild mitral valve regurgitation. No evidence of mitral valve stenosis. MV peak gradient, 4.4 mmHg. The mean mitral valve gradient is 1.0 mmHg. Tricuspid Valve: The tricuspid valve is normal in structure. Tricuspid valve regurgitation is mild . No evidence of tricuspid stenosis. Aortic Valve: The aortic valve has an indeterminant number of cusps. Aortic valve regurgitation is not visualized. No aortic stenosis is present. Aortic valve mean gradient measures 1.0 mmHg. Aortic valve peak gradient measures 2.6 mmHg. Aortic valve area, by VTI measures 2.06 cm. Pulmonic Valve: The pulmonic valve was normal in structure. Pulmonic valve regurgitation is not visualized. No evidence of pulmonic stenosis.  Aorta: The aortic root is normal in size and structure. Venous: The inferior vena cava is normal in size with greater than 50% respiratory variability, suggesting right atrial pressure of 3 mmHg. IAS/Shunts: No atrial level shunt detected by color flow Doppler. Additional Comments: 3D was performed not requiring image post processing on an independent workstation and was indeterminate.  LEFT VENTRICLE PLAX 2D LVIDd:         3.90 cm   Diastology LVIDs:         3.40 cm   LV e' medial:    5.55 cm/s LV PW:         2.00 cm   LV E/e' medial:  16.1 LV IVS:        1.30 cm   LV e' lateral:   10.10 cm/s LVOT diam:     1.80 cm   LV E/e' lateral: 8.9 LV SV:         29 LV SV Index:   14 LVOT Area:     2.54 cm  RIGHT VENTRICLE RV S prime:     8.70 cm/s TAPSE (M-mode): 1.9 cm LEFT ATRIUM             Index        RIGHT ATRIUM           Index LA diam:        4.40 cm 2.12 cm/m   RA Area:     14.00 cm LA Vol (A2C):   59.7 ml 28.77 ml/m  RA Volume:   27.30 ml  13.16 ml/m LA Vol (A4C):   82.0 ml 39.52 ml/m LA Biplane Vol: 69.9 ml 33.68 ml/m  AORTIC VALVE                    PULMONIC VALVE AV Area (Vmax):    2.11 cm     PV Vmax:        0.96 m/s AV Area (Vmean):   1.90 cm     PV Vmean:       67.600 cm/s AV Area (VTI):     2.06 cm  PV VTI:         0.177 m AV Vmax:           80.60 cm/s   PV Peak grad:   3.7 mmHg AV Vmean:          55.600 cm/s  PV Mean grad:   2.0 mmHg AV VTI:            0.142 m      RVOT Peak grad: 2 mmHg AV Peak Grad:      2.6 mmHg AV Mean Grad:      1.0 mmHg LVOT Vmax:         66.90 cm/s LVOT Vmean:        41.500 cm/s LVOT VTI:          0.115 m LVOT/AV VTI ratio: 0.81  AORTA Ao Root diam: 2.90 cm Ao Asc diam:  2.50 cm MITRAL VALVE MV Area (PHT): 3.39 cm    SHUNTS MV Area VTI:   1.23 cm    Systemic VTI:  0.12 m MV Peak grad:  4.4 mmHg    Systemic Diam: 1.80 cm MV Mean grad:  1.0 mmHg    Pulmonic VTI:  0.120 m MV Vmax:       1.05 m/s MV Vmean:      50.8 cm/s MV Decel Time: 224 msec MV E velocity: 89.60 cm/s  Evalene Lunger MD Electronically signed by Evalene Lunger MD Signature Date/Time: 02/14/2024/1:22:43 PM    Final    MR BRAIN WO CONTRAST Result Date: 02/14/2024 EXAM: MRI BRAIN WITHOUT CONTRAST 02/14/2024 12:35:23 PM TECHNIQUE: Multiplanar multisequence MRI of the head/brain was performed without the administration of intravenous contrast. COMPARISON: MRI brain 03/03/2022. CLINICAL HISTORY: Neuro deficit, acute, stroke suspected; left side weakness, onset >12 hours ago. History of CVA. FINDINGS: BRAIN AND VENTRICLES: Acute infarct in the right posterior limb of the internal capsule. Background of moderate chronic small vessel disease with old infarct along the left middle frontal gyrus and old infarct in the medial aspect of the left cerebellar hemisphere. Temporoparietal predominant volume loss. No intracranial hemorrhage. No mass. No midline shift. No hydrocephalus. The sella is unremarkable. Normal flow voids. ORBITS: No acute abnormality. SINUSES AND MASTOIDS: No acute abnormality. BONES AND SOFT TISSUES: Normal marrow signal. No acute soft tissue abnormality. IMPRESSION: 1. Acute infarct in the posterior limb of the right internal capsule. 2. Background of moderate chronic small vessel disease with old infarcts along the left middle frontal gyrus and medial aspect of the left cerebellar hemisphere. Electronically signed by: Ryan Chess MD 02/14/2024 12:40 PM EDT RP Workstation: [id number]   US  Renal Result Date: 02/14/2024 CLINICAL DATA:  Renal failure EXAM: RENAL / URINARY TRACT ULTRASOUND COMPLETE COMPARISON:  09/13/2018 FINDINGS: Right Kidney: Renal measurements: 10.8 x 5.0 x 4.8 cm. = volume: 135 mL. Simple cyst is noted measuring up to 1.3 cm. No follow-up is recommended. Mild increased echogenicity is noted. No cortical thinning is seen. Left Kidney: Renal measurements: 10.6 x 6.3 x 4.6 cm. = volume: 161 mL. No mass lesion or hydronephrosis is noted. 10 mm echogenic focus is noted consistent with  nonobstructing stone. This is stable from the prior CT. Mild increased echogenicity is noted. Bladder: Appears normal for degree of bladder distention. Other: None. IMPRESSION: Changes consistent with medical renal disease. 10 mm nonobstructing left renal stone. No other focal abnormality is noted. Electronically Signed   By: Oneil Devonshire M.D.   On: 02/14/2024 02:13   DG Chest Portable 1 View Result  Date: 02/13/2024 CLINICAL DATA:  Fall injury.  Polytrauma. EXAM: PORTABLE CHEST 1 VIEW PORTABLE PELVIS 1 VIEW COMPARISON:  Portable chest 03/03/2022, CTA abdomen and pelvis 09/13/2022. FINDINGS: Chest AP portable 11:02 p.m.: There is overlying material and telemetry leads. The lungs are mildly emphysematous but clear. There is mild chronic elevation of the right hemidiaphragm. The sulci are sharp. Mild cardiomegaly. There is a normal mediastinal configuration. Calcification of the transverse aorta. Thoracic cage is intact. Moderate thoracic spondylosis and bridging enthesopathy. Osteopenia. Portable AP pelvis: No pelvic fracture or diastasis is seen. No other focal bone lesions. Osteopenia. Symmetric moderate to advanced bilateral hip arthrosis with axial and superior joint space loss and slight acetabular protrusio with acetabular and femoral head osteophytes. Patchy calcification both common femoral arteries. Otherwise unremarkable soft tissues. Postsurgical change again noted right lower abdominal quadrant. IMPRESSION: 1. No evidence of acute chest disease.  Stable COPD chest. 2. Mild cardiomegaly. 3. No evidence of pelvic fracture or diastasis. 4. Osteopenia and degenerative change. 5. Aortic atherosclerosis. 6. Moderate to advanced bilateral hip arthrosis. Electronically Signed   By: Francis Quam M.D.   On: 02/13/2024 23:23   DG Pelvis 1-2 Views Result Date: 02/13/2024 CLINICAL DATA:  Fall injury.  Polytrauma. EXAM: PORTABLE CHEST 1 VIEW PORTABLE PELVIS 1 VIEW COMPARISON:  Portable chest 03/03/2022, CTA  abdomen and pelvis 09/13/2022. FINDINGS: Chest AP portable 11:02 p.m.: There is overlying material and telemetry leads. The lungs are mildly emphysematous but clear. There is mild chronic elevation of the right hemidiaphragm. The sulci are sharp. Mild cardiomegaly. There is a normal mediastinal configuration. Calcification of the transverse aorta. Thoracic cage is intact. Moderate thoracic spondylosis and bridging enthesopathy. Osteopenia. Portable AP pelvis: No pelvic fracture or diastasis is seen. No other focal bone lesions. Osteopenia. Symmetric moderate to advanced bilateral hip arthrosis with axial and superior joint space loss and slight acetabular protrusio with acetabular and femoral head osteophytes. Patchy calcification both common femoral arteries. Otherwise unremarkable soft tissues. Postsurgical change again noted right lower abdominal quadrant. IMPRESSION: 1. No evidence of acute chest disease.  Stable COPD chest. 2. Mild cardiomegaly. 3. No evidence of pelvic fracture or diastasis. 4. Osteopenia and degenerative change. 5. Aortic atherosclerosis. 6. Moderate to advanced bilateral hip arthrosis. Electronically Signed   By: Francis Quam M.D.   On: 02/13/2024 23:23    Scheduled Meds:  [START ON 02/16/2024]  stroke: early stages of recovery book   Does not apply Once   amLODipine   10 mg Oral Daily   aspirin  EC  81 mg Oral Daily   atorvastatin   40 mg Oral Daily   calcium -vitamin D   1 tablet Oral Daily   cloNIDine   0.3 mg Oral BID   insulin  aspart  0-9 Units Subcutaneous TID WC   latanoprost   1 drop Both Eyes QHS   metoprolol  tartrate  50 mg Oral BID   Continuous Infusions:  heparin  1,100 Units/hr (02/15/24 0332)     LOS: 1 day  MDM: Patient is high risk for one or more organ failure.  They necessitate ongoing hospitalization for continued IV therapies and subsequent lab monitoring. Total time spent interpreting labs and vitals, reviewing the medical record, coordinating care amongst  consultants and care team members, directly assessing and discussing care with the patient and/or family: 55 min   Fardeen Steinberger, DO [facility]  To contact the attending physician between 7A-7P please use Epic Chat. To contact the covering physician during after hours 7P-7A, please review Amion.  02/15/2024, 1:58 PM   *  This document has been created with the assistance of dictation software. Please excuse typographical errors. *

## 2024-02-15 NOTE — TOC Initial Note (Signed)
 Transition of Care [facility]) - Initial/Assessment Note    Patient Details  Name: Kaitlin Gomez MRN: [id number] Date of Birth: 04/25/1935  Transition of Care [facility]) CM/SW Contact:    [place] L Eveleen Mcnear, LCSW Phone Number: 02/15/2024, 5:54 PM  Clinical Narrative:                  CSW completed brief assessment. Patient was not motivated to participate. Patient lives with her son but advised that her son is currently in a rehab facility in [place]. She did not specify what kind of facility.   Prescriptions are filled at [facility]. Patient did not report financial concerns. Patient stated that the only DME she has at home is a cane.   CSW attempted to complete SNF workup. Patient advised that she wanted to wait before she considered SNF. The phone rang and CSW lost the patients attention. Patient began to focus on her phone call.   CSW left the room to print resources for SNF. CSW returned to the room to provide the list to patient. She was off the phone but she eating.   TOC will follow-up with patient.        Patient Goals and CMS Choice            Expected Discharge Plan and Services                                              Prior Living Arrangements/Services                       Activities of Daily Living      Permission Sought/Granted                  Emotional Assessment              Admission diagnosis:  Weakness [R53.1] AKI (acute kidney injury) (HCC) [N17.9] Fall, initial encounter [W19.XXXA] Patient Active Problem List   Diagnosis Date Noted   Hypertensive urgency 02/14/2024   Dyslipidemia 02/14/2024   Type 2 diabetes mellitus without complications (HCC) 02/14/2024   Fall at home, initial encounter 02/14/2024   Weakness 02/14/2024   Atrial fibrillation with RVR (HCC) 02/14/2024   Acute stroke due to ischemia (HCC) 02/14/2024   PAD (peripheral artery disease) (HCC) 02/14/2024   Stenosis of carotid artery 02/14/2024   AKI (acute  kidney injury) (HCC) 02/13/2024   Sinus bradycardia 02/25/2022   Hyperlipidemia 02/25/2022   BRBPR (bright red blood per rectum) 10/30/2016   Diabetes (HCC) 10/30/2016   HTN (hypertension) 10/30/2016   Crohn's disease (HCC) 10/30/2016   Lower GI bleed 10/30/2016   Colitis 12/31/2014   PCP:  Buren Rock HERO, MD Pharmacy:   [facility] [address]), [place] - [address] [place]) [place] [address] Phone: [phone] Fax: [phone]     Social Drivers of Health (SDOH) Social History: SDOH Screenings   Food Insecurity: No Food Insecurity (09/13/2022)  Housing: Low Risk  (09/13/2022)  Transportation Needs: No Transportation Needs (09/13/2022)  Utilities: Not At Risk (09/13/2022)  Tobacco Use: Low Risk  (02/13/2024)   SDOH Interventions:     Readmission Risk Interventions    03/07/2022   12:00 PM  Readmission Risk Prevention Plan  Transportation Screening Complete  PCP or Specialist Appt within 5-7 Days Complete  Home Care  Screening Complete  Medication Review (RN CM) Complete

## 2024-02-16 DIAGNOSIS — R7989 Other specified abnormal findings of blood chemistry: Secondary | ICD-10-CM | POA: Diagnosis not present

## 2024-02-16 DIAGNOSIS — M4802 Spinal stenosis, cervical region: Secondary | ICD-10-CM | POA: Diagnosis not present

## 2024-02-16 DIAGNOSIS — I480 Paroxysmal atrial fibrillation: Secondary | ICD-10-CM | POA: Diagnosis not present

## 2024-02-16 DIAGNOSIS — W19XXXA Unspecified fall, initial encounter: Secondary | ICD-10-CM | POA: Diagnosis not present

## 2024-02-16 DIAGNOSIS — N179 Acute kidney failure, unspecified: Secondary | ICD-10-CM | POA: Diagnosis not present

## 2024-02-16 DIAGNOSIS — I160 Hypertensive urgency: Secondary | ICD-10-CM | POA: Diagnosis not present

## 2024-02-16 DIAGNOSIS — E785 Hyperlipidemia, unspecified: Secondary | ICD-10-CM | POA: Diagnosis not present

## 2024-02-16 DIAGNOSIS — I639 Cerebral infarction, unspecified: Secondary | ICD-10-CM | POA: Diagnosis not present

## 2024-02-16 DIAGNOSIS — I4891 Unspecified atrial fibrillation: Secondary | ICD-10-CM | POA: Diagnosis not present

## 2024-02-16 DIAGNOSIS — I6340 Cerebral infarction due to embolism of unspecified cerebral artery: Secondary | ICD-10-CM | POA: Diagnosis not present

## 2024-02-16 LAB — COMPREHENSIVE METABOLIC PANEL WITH GFR
ALT: 58 U/L — ABNORMAL HIGH (ref 0–44)
AST: 73 U/L — ABNORMAL HIGH (ref 15–41)
Albumin: 2.8 g/dL — ABNORMAL LOW (ref 3.5–5.0)
Alkaline Phosphatase: 63 U/L (ref 38–126)
Anion gap: 11 (ref 5–15)
BUN: 40 mg/dL — ABNORMAL HIGH (ref 8–23)
CO2: 25 mmol/L (ref 22–32)
Calcium: 9.4 mg/dL (ref 8.9–10.3)
Chloride: 99 mmol/L (ref 98–111)
Creatinine, Ser: 1.18 mg/dL — ABNORMAL HIGH (ref 0.44–1.00)
GFR, Estimated: 45 mL/min — ABNORMAL LOW (ref >60)
Glucose, Bld: 140 mg/dL — ABNORMAL HIGH (ref 70–99)
Potassium: 4.6 mmol/L (ref 3.5–5.1)
Sodium: 135 mmol/L (ref 135–145)
Total Bilirubin: 1 mg/dL (ref 0.0–1.2)
Total Protein: 6.7 g/dL (ref 6.5–8.1)

## 2024-02-16 LAB — CBC WITH DIFFERENTIAL/PLATELET
Abs Immature Granulocytes: 0.05 K/uL (ref 0.00–0.07)
Basophils Absolute: 0.1 K/uL (ref 0.0–0.1)
Basophils Relative: 1 %
Eosinophils Absolute: 0.1 K/uL (ref 0.0–0.5)
Eosinophils Relative: 1 %
HCT: 46.1 % — ABNORMAL HIGH (ref 36.0–46.0)
Hemoglobin: 15.4 g/dL — ABNORMAL HIGH (ref 12.0–15.0)
Immature Granulocytes: 1 %
Lymphocytes Relative: 14 %
Lymphs Abs: 1.4 K/uL (ref 0.7–4.0)
MCH: 30.3 pg (ref 26.0–34.0)
MCHC: 33.4 g/dL (ref 30.0–36.0)
MCV: 90.7 fL (ref 80.0–100.0)
Monocytes Absolute: 1.2 K/uL — ABNORMAL HIGH (ref 0.1–1.0)
Monocytes Relative: 12 %
Neutro Abs: 7 K/uL (ref 1.7–7.7)
Neutrophils Relative %: 71 %
Platelets: 238 K/uL (ref 150–400)
RBC: 5.08 MIL/uL (ref 3.87–5.11)
RDW: 13.3 % (ref 11.5–15.5)
WBC: 9.8 K/uL (ref 4.0–10.5)
nRBC: 0 % (ref 0.0–0.2)

## 2024-02-16 LAB — GLUCOSE, CAPILLARY
Glucose-Capillary: 114 mg/dL — ABNORMAL HIGH (ref 70–99)
Glucose-Capillary: 154 mg/dL — ABNORMAL HIGH (ref 70–99)
Glucose-Capillary: 131 mg/dL — ABNORMAL HIGH (ref 70–99)
Glucose-Capillary: 165 mg/dL — ABNORMAL HIGH (ref 70–99)

## 2024-02-16 LAB — C-REACTIVE PROTEIN: CRP: 14.7 mg/dL — ABNORMAL HIGH (ref <1.0)

## 2024-02-16 LAB — MAGNESIUM: Magnesium: 1.8 mg/dL (ref 1.7–2.4)

## 2024-02-16 LAB — CK: Total CK: 22 U/L — ABNORMAL LOW (ref 38–234)

## 2024-02-16 LAB — PHOSPHORUS: Phosphorus: 3.2 mg/dL (ref 2.5–4.6)

## 2024-02-16 MED ORDER — POTASSIUM CHLORIDE CRYS ER 20 MEQ PO TBCR
30.0000 meq | EXTENDED_RELEASE_TABLET | Freq: Once | ORAL | Status: AC
  Administered 2024-02-16: 30 meq via ORAL
  Filled 2024-02-16: qty 1

## 2024-02-16 MED ORDER — AMLODIPINE BESYLATE 10 MG PO TABS
10.0000 mg | ORAL_TABLET | Freq: Every day | ORAL | Status: DC
  Administered 2024-02-17 – 2024-02-19 (×3): 10 mg via ORAL
  Filled 2024-02-16 (×3): qty 1

## 2024-02-16 MED ORDER — APIXABAN 5 MG PO TABS
5.0000 mg | ORAL_TABLET | Freq: Two times a day (BID) | ORAL | Status: DC
  Administered 2024-02-16 – 2024-02-19 (×8): 5 mg via ORAL
  Filled 2024-02-16 (×8): qty 1

## 2024-02-16 MED ORDER — NYSTATIN 100000 UNIT/GM EX CREA
TOPICAL_CREAM | Freq: Two times a day (BID) | CUTANEOUS | Status: DC
  Filled 2024-02-16: qty 30

## 2024-02-16 NOTE — Progress Notes (Addendum)
 NEUROLOGY CONSULT FOLLOW UP NOTE   Date of service: February 16, 2024 Patient Name: Kaitlin Gomez MRN:  [id number] DOB:  Jan 24, 1935  Interval Hx/subjective   No new events overnight.  Continued significant BLE weakness Vitals   Vitals:   02/15/24 2357 02/16/24 0405 02/16/24 0737 02/16/24 0832  BP: 137/83 135/77 (!) 150/67 (!) 150/67  Pulse: 87 81 78   Resp: 16 14 18    Temp: 98.7 F (37.1 C) 98.1 F (36.7 C) 98.8 F (37.1 C)   TempSrc:   Oral   SpO2: 97% 97% 100%   Weight:      Height:         Body mass index is 33.29 kg/m.  Physical Exam   Constitutional: Appears well-developed and well-nourished. Generalized weakness Psych: Affect appropriate to situation.  Eyes: No scleral injection.  Head: Normocephalic.  Respiratory: Effort normal, non-labored breathing.  GI: Soft.  No distension. There is no tenderness.  Extremities: BLE edema  Neurologic Examination   Mental Status: Alert, oriented, thought content appropriate.  Speech fluent without evidence of aphasia.  Able to follow 3 step commands without difficulty. Cranial Nerves: II: Visual fields grossly normal III,IV, VI: ptosis not present, extra-ocular motions intact bilaterally V,VII: smile symmetric, facial light touch sensation normal bilaterally VIII: hearing normal bilaterally XI: bilateral shoulder shrug XII: midline tongue extension Motor: Right :Upper extremity   4/5                                      Left:     Upper extremity   5-/5             Lower extremity   1-2/5                                                  Lower extremity   1-2/5 Sensory: Pinprick and light touch intact throughout, bilaterally Deep Tendon Reflexes: Symmetric throughout   Medications  Current Facility-Administered Medications:    acetaminophen  (TYLENOL ) tablet 650 mg, 650 mg, Oral, Q6H PRN **OR** acetaminophen  (TYLENOL ) suppository 650 mg, 650 mg, Rectal, Q6H PRN, Mansy, Jan A, MD   amLODipine  (NORVASC ) tablet 5 mg, 5  mg, Oral, Daily, Dezii, Alexandra, DO, 5 mg at 02/16/24 9167   apixaban  (ELIQUIS ) tablet 5 mg, 5 mg, Oral, BID, Dunn, Ryan M, PA-C, 5 mg at 02/16/24 1153   aspirin  EC tablet 81 mg, 81 mg, Oral, Daily, Mansy, Jan A, MD, 81 mg at 02/16/24 0830   atorvastatin  (LIPITOR) tablet 40 mg, 40 mg, Oral, Daily, Dezii, Alexandra, DO, 40 mg at 02/16/24 0831   calcium -vitamin D  (OSCAL WITH D) 500-5 MG-MCG per tablet 1 tablet, 1 tablet, Oral, Daily, Mansy, Jan A, MD, 1 tablet at 02/16/24 0831   cloNIDine  (CATAPRES ) tablet 0.1 mg, 0.1 mg, Oral, BID, Dezii, Alexandra, DO, 0.1 mg at 02/16/24 0830   hydrALAZINE  (APRESOLINE ) injection 10 mg, 10 mg, Intravenous, Q6H PRN, Mansy, Jan A, MD, 10 mg at 02/14/24 0334   HYDROcodone -acetaminophen  (NORCO/VICODIN) 5-325 MG per tablet 1 tablet, 1 tablet, Oral, Q6H PRN, Mansy, Jan A, MD   labetalol  (NORMODYNE ) injection 20 mg, 20 mg, Intravenous, Q3H PRN, Mansy, Jan A, MD   latanoprost  (XALATAN ) 0.005 % ophthalmic solution 1 drop, 1 drop, Both Eyes, QHS, Mansy,  Madison LABOR, MD, 1 drop at 02/15/24 2339   magnesium  hydroxide (MILK OF MAGNESIA) suspension 30 mL, 30 mL, Oral, Daily PRN, Mansy, Jan A, MD   metoprolol  tartrate (LOPRESSOR ) tablet 50 mg, 50 mg, Oral, BID, Mansy, Jan A, MD, 50 mg at 02/16/24 0831   ondansetron  (ZOFRAN ) tablet 4 mg, 4 mg, Oral, Q6H PRN **OR** ondansetron  (ZOFRAN ) injection 4 mg, 4 mg, Intravenous, Q6H PRN, Mansy, Jan A, MD   traZODone  (DESYREL ) tablet 25 mg, 25 mg, Oral, QHS PRN, Mansy, Jan A, MD  Labs and Diagnostic Imaging   CBC:  Recent Labs  Lab 02/14/24 0555 02/15/24 1131  WBC 10.5 8.2  NEUTROABS  --  6.5  HGB 15.6* 14.7  HCT 47.2* 44.0  MCV 90.9 91.7  PLT 206 177    Basic Metabolic Panel:  Lab Results  Component Value Date   NA 135 02/15/2024   K 3.4 (L) 02/15/2024   CO2 25 02/15/2024   GLUCOSE 195 (H) 02/15/2024   BUN 38 (H) 02/15/2024   CREATININE 1.45 (H) 02/15/2024   CALCIUM  8.6 (L) 02/15/2024   GFRNONAA 35 (L) 02/15/2024   GFRAA  >60 11/02/2016   Lipid Panel:  Lab Results  Component Value Date   LDLCALC 85 02/15/2024   HgbA1c:  Lab Results  Component Value Date   HGBA1C 6.0 (H) 02/14/2024   Urine Drug Screen: No results found for: LABOPIA, COCAINSCRNUR, LABBENZ, AMPHETMU, THCU, LABBARB  Alcohol Level No results found for: [facility] INR  Lab Results  Component Value Date   INR 1.3 (H) 02/14/2024   APTT  Lab Results  Component Value Date   APTT 26 02/14/2024   AED levels: No results found for: PHENYTOIN, ZONISAMIDE, LAMOTRIGINE, LEVETIRACETA   Assessment   Kaitlin Gomez is a 89 y.o. female  with hx of DM, HTN, HLD, carotid artery occlusion and Crohns who presents with complaints of weakness.  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  Infarct not particularly consistent with examination findings but patient in atrial fibrillation and therefore infarct likely cardioembolic in etiology.  Patient on heparin .  Carotid dopplers show 80% LICA stenosis.  Patient asymptomatic from this finding on this presentation.  It is concerning that the patient's bilateral lower extremities are so weak.  Again not consistent with MIR findings.  MRI of the cervical spine does show severe spinal stenosis at C4-5 with cord compression.  This may be contributing to patient's BLE weakness which seems to have been progressive over some time.  Treatment options discussed with patient and she is not interested in surgical evaluation at this time.  Patient seen by vascular for carotid results.  At this time patient is also not inclined to pursue intervention for that as well.   LDL 107.  A1c 6.0.  Echocardiogram shows no cardiac source of emboli, EF 50-55%.    Recommendations  Agree with anticoagulation PT/OT as tolerated No further neurologic intervention is recommended at this time.  If further questions arise, please call or page at that time.  Thank you for allowing neurology to  participate in the care of this patient. ______________________________________________________________________   Signed, Lien Lyman, MD [facility]

## 2024-02-16 NOTE — Progress Notes (Signed)
 PROGRESS NOTE    [name]  FMW:[id number] DOB: 17-Mar-1935 DOA: 02/13/2024 PCP: Buren Rock HERO, MD  Chief Complaint  Patient presents with   [facility] Course:  CHIRSTY Gomez is an 89 year old female with prior CVA, CAD, type 2 diabetes, hypertension, dyslipidemia, carotid disease, known coronary artery stenosis, who presents to the emergency department in which she woke up and felt weak in both legs.  She reports she tried to walk but was unable to do so. On arrival to ED BP was 145/59, heart rate 56, creatinine 3.3, CKD 729.  High-sensitivity troponin 39 with repeat at 52.  EKG revealed NSR, QTc 49, rate 75.  CXR and pelvic x-ray without acute process.  Renal ultrasound revealed changes consistent with medical renal disease and a 10 mm obstructing left renal stone with no other focal abnormalities.  She was given IV fluid bolus and admitted for management.  Subjective: Patient reports she still is very weak.  Has not yet worked with physical therapy today.  She is hopeful that rehab will help her regain her strength.  Objective: Vitals:   02/16/24 0405 02/16/24 0737 02/16/24 0832 02/16/24 1209  BP: 135/77 (!) 150/67 (!) 150/67 (!) 140/73  Pulse: 81 78  (!) 51  Resp: 14 18  18   Temp: 98.1 F (36.7 C) 98.8 F (37.1 C)  98.7 F (37.1 C)  TempSrc:  Oral    SpO2: 97% 100%  99%  Weight:      Height:        Intake/Output Summary (Last 24 hours) at 02/16/2024 1413 Last data filed at 02/16/2024 0705 Gross per 24 hour  Intake 240 ml  Output 300 ml  Net -60 ml   Filed Weights   02/13/24 2018 02/14/24 0151  Weight: 79.4 kg 96.4 kg    Examination: General exam: Appears calm and comfortable, NAD  Respiratory system: No work of breathing, symmetric chest wall expansion Cardiovascular system: S1 & S2 heard, RRR.  Gastrointestinal system: Abdomen is nondistended, soft and nontender.  Neuro: Alert and oriented.  Drowsy but easily arousable Skin: No rashes,  lesions Psychiatry: Demonstrates appropriate judgement and insight. Mood & affect appropriate for situation.   Assessment & Plan:  Principal Problem:   AKI (acute kidney injury) (HCC) Active Problems:   Hypertensive urgency   Fall at home, initial encounter   Dyslipidemia   Type 2 diabetes mellitus without complications (HCC)   Weakness   Atrial fibrillation with RVR (HCC)   Acute stroke due to ischemia [facility])   PAD (peripheral artery disease) (HCC)   Stenosis of carotid artery    Acute posterior CVA - Left upper extremity weakness, profound lower extremity weakness. - Brain MRI ordered: Acute infarct in posterior limb of right internal capsule.  Patient is 24 hours out from event. - Echocardiogram: EF 50 to 55%, hypokinesis of the basal inferior wall with moderate left ventricular hypertrophy.  Diastolic parameters indeterminate. - LDL 107, statin initiated - Hemoglobin A1c at goal 6.0% - Continue PT/OT while admitted.  Plan to discharge to SNF. - Continue monitoring on telemetry.  Lower extremity weakness - Greater than expected given stroke findings.  Cervical spine MRI reveals spinal stenosis at C4-5 with cord compression though this is chronic.  Suspect her weakness is multifactorial from new CVA and chronic spinal stenosis. - Patient has declined surgical intervention with neurosurgery or vascular surgery. - Continue with PT/OT, TOC consulted for SNF placement.  Coronary artery stenosis, left severe - Known  prior to arrival, but is consistently less than 80% so was being followed outpatient - Carotid duplex this admission: Mild right ICA and severe left ICA stenosis, possibly representing complete occlusion - Vascular surgery was consulted and has recommended CTA however patient refuses surgical intervention even if offered.  Will discontinue further workup and proceed with aspirin , statin, DOAC as above  History of CVA - 2023 patient was diagnosed with bilateral small  embolic infarcts thought to be cardioembolic though atrial fibrillation was not diagnosed at that time.  At that time she was determined not to be a candidate for anticoagulation due to recent GI bleed and anemia.  She did receive loop recorder during that time.  New onset atrial fibrillation, with RVR - Patient denies history of this.  Did have cardiac monitor in the past and there was concern of possible A-fib in the past from likely cardioembolic CVA 2023 - Did have RVR this admission which resolved with one-time push metoprolol  - Currently rate controlled on Lopressor  - Continue monitoring on telemetry.  Currently very stable - Cardiology consulted, no plans to restore NSR given recent CVA - Continue with DOAC - Cardiology to consider ischemic workup outpatient - Echo with preserved Ef, hypokinesis of the basal anterior wall with moderate LVH. - TSH WNL  Hypertensive urgency ruled out - Blood pressure overnight 9/19 reported to be severely elevated.  This was blood pressure cuff issue.  When measured accurately blood pressures have been normotensive.  Reading drip was initially ordered but not needed.  Hypertension - Normotensive blood pressure goals now - Initially started on amlodipine  and clonidine  given AKI on arrival.  Blood pressure better controlled now.  Discontinue clonidine .  Increase amlodipine  to 10 daily.  Further med titration daily  AKI - Baseline creatinine appears to be 0.88 from 1 year ago.  Creatinine 3.3 on arrival with elevated BUN. - Likely prerenal given poor p.o. intake and downtime.  Also likely some degree of ATN given mild rhabdomyolysis and possible hypotension at home with A-fib RVR. - Creatinine continues to resolve, continue monitoring daily CMP.  Difficult stick and unable to get labs today.  Will try again tomorrow. - Continue to renally dosed with a creatinine clearance of 32 for now  Rhabdomyolysis, nontraumatic - Repeat CK today to ensure  downtrend.  10 mm nonobstructing left renal stone - Aware.  No flank pain currently.  Stone is nonobstructive.  She is currently on IV fluids, will continue.  Monitor creatinine.  Type 2 diabetes - Hemoglobin A1c 6.0% - No indication for sliding scale insulin  at this time.  DVT prophylaxis: Eliquis    Code Status: Full Code Disposition: Medically ready for discharge.  Pending SNF placement.  TOC aware working on it.  FL 2 signed today  Consultants:  Treatment Team:  Consulting Physician: Germaine Raring, MD  Procedures:    Antimicrobials:  Anti-infectives (From admission, onward)    None       Data Reviewed: I have personally reviewed following labs and imaging studies CBC: Recent Labs  Lab 02/13/24 2019 02/14/24 0124 02/14/24 0555 02/15/24 1131  WBC 10.6* 11.3* 10.5 8.2  NEUTROABS  --   --   --  6.5  HGB 14.4 15.4* 15.6* 14.7  HCT 43.7 46.0 47.2* 44.0  MCV 92.4 89.7 90.9 91.7  PLT 223 212 206 177   Basic Metabolic Panel: Recent Labs  Lab 02/13/24 2019 02/14/24 0124 02/14/24 0555 02/14/24 1552 02/15/24 1131  NA 139  --  140  --  135  K 3.9  --  3.3*  --  3.4*  CL 102  --  100  --  97*  CO2 23  --  25  --  25  GLUCOSE 149*  --  136*  --  195*  BUN 54*  --  49*  --  38*  CREATININE 3.30* 2.85* 2.51*  --  1.45*  CALCIUM  9.3  --  9.3  --  8.6*  MG  --   --   --  1.7  --    GFR: Estimated Creatinine Clearance: 32.6 mL/min (A) (by C-G formula based on SCr of 1.45 mg/dL (H)). Liver Function Tests: Recent Labs  Lab 02/13/24 2019 02/15/24 1131  AST 30 31  ALT 21 22  ALKPHOS 58 53  BILITOT 1.3* 0.9  PROT 7.2 5.9*  ALBUMIN 3.5 2.6*   CBG: Recent Labs  Lab 02/15/24 1152 02/15/24 1555 02/15/24 2252 02/16/24 0739 02/16/24 1211  GLUCAP 272* 242* 139* 114* 154*    No results found for this or any previous visit (from the past 240 hours).   Radiology Studies: US  Carotid Bilateral Result Date: 02/15/2024 CLINICAL DATA:  89 year old female with  neurologic deficit, acute lacunar infarct posterior limb right internal capsule on brain MRI. EXAM: BILATERAL CAROTID DUPLEX ULTRASOUND TECHNIQUE: Elnor scale imaging, color Doppler and duplex ultrasound were performed of bilateral carotid and vertebral arteries in the neck. COMPARISON:  Brain MRI 02/14/2024.  CTA neck 03/07/2022. FINDINGS: Criteria: Quantification of carotid stenosis is based on velocity parameters that correlate the residual internal carotid diameter with NASCET-based stenosis levels, using the diameter of the distal internal carotid lumen as the denominator for stenosis measurement. The following velocity measurements were obtained: RIGHT ICA: 65 cm/sec CCA: 77 cm/sec SYSTOLIC ICA/CCA RATIO:  0.8 ECA: 90 cm/sec LEFT ICA: 216 cm/sec CCA: 52 cm/sec SYSTOLIC ICA/CCA RATIO:  4.1 ECA: 65 cm/sec RIGHT CAROTID ARTERY: Shadowing echogenic calcified plaque, appearing similar to the prior CTA. RIGHT VERTEBRAL ARTERY:  Patent with antegrade flow detected. LEFT CAROTID ARTERY: Bulky plaque with high-grade stenosis. This was seen to be approaching a radiographic string sign on the 2023 CTA (numerically estimated at 80% at that time, series 5, image 39 of that exam). Pronounced Spectral broadening on this exam (images 57, 60). LEFT VERTEBRAL ARTERY:  Patent with antegrade flow detected. IMPRESSION: 1. Positive for Very Severe Atherosclerotic Stenosis of the proximal Left ICA, was numerically estimated at 80% by CTA in 2023, could be Near Occlusive now. Recommend Vascular Surgery consultation. 2. Mild contralateral right carotid bifurcation atherosclerosis, probably not significantly changed from prior CTA. Electronically Signed   By: VEAR Hurst M.D.   On: 02/15/2024 05:19   MR LUMBAR SPINE WO CONTRAST Result Date: 02/15/2024 EXAM: MRI LUMBAR SPINE 02/15/2024 02:51:22 AM TECHNIQUE: Multiplanar multisequence MRI of the lumbar spine was performed without the administration of intravenous contrast. COMPARISON: None  available. CLINICAL HISTORY: Myelopathy, acute, lumbar spine. FINDINGS: BONES AND ALIGNMENT: Normal alignment. Normal vertebral body heights. Bone marrow signal is unremarkable. SPINAL CORD: The conus terminates normally. SOFT TISSUES: No paraspinal mass. L1-L2: No significant disc herniation. No spinal canal stenosis or neural foraminal narrowing. L2-L3: Small disc bulge and mild facet hypertrophy with narrowing of the lateral recesses. No central spinal canal or neural foraminal stenosis. L3-L4: Small disc bulge and mild facet hypertrophy. Mild left foraminal stenosis. L4-L5: Small central disc protrusion. L5-S1: Intermediate size disc bulge with endplate spurring and narrowing of the lateral recesses. Mild central spinal canal stenosis. Mild bilateral  foraminal stenosis. IMPRESSION: 1. L5-S1 mild spinal canal stenosis and mild bilateral neural foraminal stenosis 2.  L3-4 mild left foraminal stenosis Electronically signed by: Franky Stanford MD 02/15/2024 03:20 AM EDT RP Workstation: [id number]   MR CERVICAL SPINE WO CONTRAST Result Date: 02/15/2024 EXAM: MRI CERVICAL SPINE WITHOUT CONTRAST 02/15/2024 02:51:22 AM TECHNIQUE: Multiplanar multisequence MRI of the cervical spine was performed. COMPARISON: CT from 01/23/2015. CLINICAL HISTORY: Myelopathy, acute, cervical spine. FINDINGS: BONES AND ALIGNMENT: Normal alignment. Normal vertebral body heights. Bone marrow signal is unremarkable. SPINAL CORD: There is faint hyperintense T2 weighted signal within the spinal cord at C4-C5. SOFT TISSUES: No paraspinal mass. C2-C3: Small central disc protrusion and mild left facet hypertrophy. No central spinal canal or neural foraminal stenosis. C3-C4: Small disc bulge with bilateral uncovertebral hypertrophy. Severe bilateral neuroforaminal stenosis. C4-C5: Large disc osteophyte complex with severe spinal canal stenosis and compression of the spinal cord. Severe bilateral foraminal stenosis. Compared to the CT from  01/23/2015, the degree of stenosis is likely unchanged. C5-C6: Disc space narrowing with uncovertebral hypertrophy causing severe bilateral foraminal stenosis. C6-C7: Left greater than right uncovertebral hypertrophy with mild right and moderate left foraminal stenosis. C7-T1: No significant disc herniation. No spinal canal stenosis or neural foraminal narrowing. IMPRESSION: 1. C4-5 severe spinal canal stenosis and compression of the spinal cord with severe bilateral neuroforaminal stenosis. Allowing for differences in modality, this is likely unchanged from 01/23/2015. 2. C5-6 severe bilateral foraminal stenosis 3. C3-4 severe bilateral neural foraminal stenosis 4. C6-7 moderate left neural foraminal stenosis Electronically signed by: Franky Stanford MD 02/15/2024 03:11 AM EDT RP Workstation: [id number]    Scheduled Meds:  amLODipine   5 mg Oral Daily   apixaban   5 mg Oral BID   aspirin  EC  81 mg Oral Daily   atorvastatin   40 mg Oral Daily   calcium -vitamin D   1 tablet Oral Daily   cloNIDine   0.1 mg Oral BID   latanoprost   1 drop Both Eyes QHS   metoprolol  tartrate  50 mg Oral BID   Continuous Infusions:     LOS: 2 days  MDM: Patient is high risk for one or more organ failure.  They necessitate ongoing hospitalization for continued IV therapies and subsequent lab monitoring. Total time spent interpreting labs and vitals, reviewing the medical record, coordinating care amongst consultants and care team members, directly assessing and discussing care with the patient and/or family: 55 min   Cartina Brousseau, DO [facility]  To contact the attending physician between 7A-7P please use Epic Chat. To contact the covering physician during after hours 7P-7A, please review Amion.  02/16/2024, 2:13 PM   *This document has been created with the assistance of dictation software. Please excuse typographical errors. *

## 2024-02-16 NOTE — TOC Progression Note (Addendum)
 Transition of Care [facility]) - Progression Note    Patient Details  Name: Kaitlin Gomez MRN: [id number] Date of Birth: 25-Nov-1934  Transition of Care [facility] CM/SW Contact  [place] L Kelii [place], [place] Phone Number: 02/16/2024, 2:22 PM  Clinical Narrative:     CSW met with patient again to discuss SNF options. Patient reviewed the list provided to her the previous day by CSW. Patient advised that she would like to go to [facility] to be with her son. Patient advised that she would also consider [facility] and [facility] as a back up.   FL2 will be completed. CSW will start bed search.   2:28pm- Bed offer received from [facility]. Bed accepted. Shara will be initiated.                     Expected Discharge Plan and Services                                               Social Drivers of Health (SDOH) Interventions SDOH Screenings   Food Insecurity: No Food Insecurity (09/13/2022)  Housing: Low Risk  (09/13/2022)  Transportation Needs: No Transportation Needs (09/13/2022)  Utilities: Not At Risk (09/13/2022)  Tobacco Use: Low Risk  (02/13/2024)    Readmission Risk Interventions    03/07/2022   12:00 PM  Readmission Risk Prevention Plan  Transportation Screening Complete  PCP or Specialist Appt within 5-7 Days Complete  Home Care Screening Complete  Medication Review (RN CM) Complete

## 2024-02-16 NOTE — Plan of Care (Signed)

## 2024-02-16 NOTE — Plan of Care (Signed)
  Problem: Coping: Goal: Ability to adjust to condition or change in health will improve Outcome: Progressing   Problem: Ischemic Stroke/TIA Tissue Perfusion: Goal: Complications of ischemic stroke/TIA will be minimized Outcome: Progressing   Problem: Self-Care: Goal: Ability to communicate needs accurately will improve Outcome: Progressing   Problem: Nutrition: Goal: Dietary intake will improve Outcome: Progressing

## 2024-02-16 NOTE — NC FL2 (Signed)
 [place]  MEDICAID FL2 LEVEL OF CARE FORM     IDENTIFICATION  Patient Name: Kaitlin Gomez Birthdate: 12-Dec-1934 Sex: female Admission Date (Current Location): 02/13/2024  [facility] and [place] Number:  Chiropodist and Address:  [facility], [address], [place], [place] [address]      Provider Number: [id number]  Attending Physician Name and Address:  Leesa Kast, DO  Relative Name and Phone Number:  Corinthia Brakeman [phone]    Current Level of Care: Hospital Recommended Level of Care: Skilled Nursing Facility Prior Approval Number:    Date Approved/Denied: 02/16/24 PASRR Number: [id number]  Discharge Plan: SNF    Current Diagnoses: Patient Active Problem List   Diagnosis Date Noted   Hypertensive urgency 02/14/2024   Dyslipidemia 02/14/2024   Type 2 diabetes mellitus without complications (HCC) 02/14/2024   Fall at home, initial encounter 02/14/2024   Weakness 02/14/2024   Atrial fibrillation with RVR (HCC) 02/14/2024   Acute stroke due to ischemia (HCC) 02/14/2024   PAD (peripheral artery disease) (HCC) 02/14/2024   Stenosis of carotid artery 02/14/2024   AKI (acute kidney injury) (HCC) 02/13/2024   Sinus bradycardia 02/25/2022   Hyperlipidemia 02/25/2022   BRBPR (bright red blood per rectum) 10/30/2016   Diabetes (HCC) 10/30/2016   HTN (hypertension) 10/30/2016   Crohn's disease (HCC) 10/30/2016   Lower GI bleed 10/30/2016   Colitis 12/31/2014    Orientation RESPIRATION BLADDER Height & Weight     Self, Time, Situation, Place  Normal External catheter Weight: 212 lb 8.4 oz (96.4 kg) Height:  5' 7 (170.2 cm)  BEHAVIORAL SYMPTOMS/MOOD NEUROLOGICAL BOWEL NUTRITION STATUS      Continent Diet (Diet Heart Room service appropriate? Yes; Fluid consistency: Thin: Cardiac starting at 09/20 0130)  AMBULATORY STATUS COMMUNICATION OF NEEDS Skin   Extensive Assist Verbally Normal                       Personal  Care Assistance Level of Assistance  Bathing, Feeding, Dressing Bathing Assistance: Maximum assistance Feeding assistance: Maximum assistance Dressing Assistance: Maximum assistance     Functional Limitations Info  Sight, Hearing, Speech          SPECIAL CARE FACTORS FREQUENCY  PT (By licensed PT), OT (By licensed OT)     PT Frequency: 2x OT Frequency: 1x            Contractures Contractures Info: Not present    Additional Factors Info  Code Status, Allergies Code Status Info: FULL Allergies Info: Advil (Ibuprofen) Rash           Current Medications (02/16/2024):  This is the current hospital active medication list Current Facility-Administered Medications  Medication Dose Route Frequency Provider Last Rate Last Admin   acetaminophen  (TYLENOL ) tablet 650 mg  650 mg Oral Q6H PRN Mansy, Jan A, MD       Or   acetaminophen  (TYLENOL ) suppository 650 mg  650 mg Rectal Q6H PRN Mansy, Jan A, MD       amLODipine  (NORVASC ) tablet 5 mg  5 mg Oral Daily Dezii, Alexandra, DO   5 mg at 02/16/24 9167   apixaban  (ELIQUIS ) tablet 5 mg  5 mg Oral BID Abigail Motto M, PA-C   5 mg at 02/16/24 1153   aspirin  EC tablet 81 mg  81 mg Oral Daily Mansy, Jan A, MD   81 mg at 02/16/24 0830   atorvastatin  (LIPITOR) tablet 40 mg  40 mg Oral Daily Dezii, Alexandra,  DO   40 mg at 02/16/24 0831   calcium -vitamin D  (OSCAL WITH D) 500-5 MG-MCG per tablet 1 tablet  1 tablet Oral Daily Mansy, Jan A, MD   1 tablet at 02/16/24 0831   cloNIDine  (CATAPRES ) tablet 0.1 mg  0.1 mg Oral BID Dezii, Alexandra, DO   0.1 mg at 02/16/24 0830   hydrALAZINE  (APRESOLINE ) injection 10 mg  10 mg Intravenous Q6H PRN Mansy, Jan A, MD   10 mg at 02/14/24 0334   HYDROcodone -acetaminophen  (NORCO/VICODIN) 5-325 MG per tablet 1 tablet  1 tablet Oral Q6H PRN Mansy, Jan A, MD       labetalol  (NORMODYNE ) injection 20 mg  20 mg Intravenous Q3H PRN Mansy, Jan A, MD       latanoprost  (XALATAN ) 0.005 % ophthalmic solution 1 drop  1 drop  Both Eyes QHS Mansy, Jan A, MD   1 drop at 02/15/24 2339   magnesium  hydroxide (MILK OF MAGNESIA) suspension 30 mL  30 mL Oral Daily PRN Mansy, Jan A, MD       metoprolol  tartrate (LOPRESSOR ) tablet 50 mg  50 mg Oral BID Mansy, Jan A, MD   50 mg at 02/16/24 0831   ondansetron  (ZOFRAN ) tablet 4 mg  4 mg Oral Q6H PRN Mansy, Jan A, MD       Or   ondansetron  (ZOFRAN ) injection 4 mg  4 mg Intravenous Q6H PRN Mansy, Jan A, MD       traZODone  (DESYREL ) tablet 25 mg  25 mg Oral QHS PRN Mansy, Madison LABOR, MD         Discharge Medications: Please see discharge summary for a list of discharge medications.  Relevant Imaging Results:  Relevant Lab Results:   Additional Information [id number]  [place], [place]

## 2024-02-16 NOTE — Progress Notes (Signed)
 Progress Note  Patient Name: Kaitlin Gomez Date of Encounter: 02/16/2024  Primary Cardiologist: Barbaraann   Subjective   She was evaluated by vascular surgery on 9/24 for significant left sided ICA stenosis.  She is refusing intervention of this.  Continues to have bilateral lower extremity weakness with significant cervical spinal stenosis. She does not want to pursue surgical intervention.  No chest pain, dyspnea, or palpitations.  No new focal deficits.  Inpatient Medications    Scheduled Meds:   stroke: early stages of recovery book   Does not apply Once   amLODipine   5 mg Oral Daily   aspirin  EC  81 mg Oral Daily   atorvastatin   40 mg Oral Daily   calcium -vitamin D   1 tablet Oral Daily   cloNIDine   0.1 mg Oral BID   latanoprost   1 drop Both Eyes QHS   metoprolol  tartrate  50 mg Oral BID   Continuous Infusions:  heparin  1,100 Units/hr (02/16/24 0221)   PRN Meds: acetaminophen  **OR** acetaminophen , hydrALAZINE , HYDROcodone -acetaminophen , labetalol , magnesium  hydroxide, ondansetron  **OR** ondansetron  (ZOFRAN ) IV, traZODone    Vital Signs    Vitals:   02/15/24 1942 02/15/24 2357 02/16/24 0405 02/16/24 0737  BP: 130/83 137/83 135/77 (!) 150/67  Pulse: 85 87 81 78  Resp: 16 16 14 18   Temp: 98 F (36.7 C) 98.7 F (37.1 C) 98.1 F (36.7 C) 98.8 F (37.1 C)  TempSrc:    Oral  SpO2: 97% 97% 97% 100%  Weight:      Height:        Intake/Output Summary (Last 24 hours) at 02/16/2024 0759 Last data filed at 02/16/2024 0705 Gross per 24 hour  Intake 600 ml  Output 850 ml  Net -250 ml   Filed Weights   02/13/24 2018 02/14/24 0151  Weight: 79.4 kg 96.4 kg    Telemetry    A-fib ventricular rates in the 60s bpm - Personally Reviewed  ECG    No new tracings - Personally Reviewed  Physical Exam   GEN: No acute distress.   Neck: No JVD. Cardiac: IRIR, no murmurs, rubs, or gallops.  Respiratory: Clear to auscultation bilaterally.  GI: Soft, nontender,  non-distended.   MS: No edema; No deformity. Neuro:  Alert and oriented x 3. Psych: Normal affect.  Labs    Chemistry Recent Labs  Lab 02/13/24 2019 02/14/24 0124 02/14/24 0555 02/15/24 1131  NA 139  --  140 135  K 3.9  --  3.3* 3.4*  CL 102  --  100 97*  CO2 23  --  25 25  GLUCOSE 149*  --  136* 195*  BUN 54*  --  49* 38*  CREATININE 3.30* 2.85* 2.51* 1.45*  CALCIUM  9.3  --  9.3 8.6*  PROT 7.2  --   --  5.9*  ALBUMIN 3.5  --   --  2.6*  AST 30  --   --  31  ALT 21  --   --  22  ALKPHOS 58  --   --  53  BILITOT 1.3*  --   --  0.9  GFRNONAA 13* 16* 18* 35*  ANIONGAP 14  --  15 13     Hematology Recent Labs  Lab 02/14/24 0124 02/14/24 0555 02/15/24 1131  WBC 11.3* 10.5 8.2  RBC 5.13* 5.19* 4.80  HGB 15.4* 15.6* 14.7  HCT 46.0 47.2* 44.0  MCV 89.7 90.9 91.7  MCH 30.0 30.1 30.6  MCHC 33.5 33.1 33.4  RDW 13.5 13.3  13.2  PLT 212 206 177    Cardiac EnzymesNo results for input(s): TROPONINI in the last 168 hours. No results for input(s): TROPIPOC in the last 168 hours.   BNP Recent Labs  Lab 02/13/24 2019  BNP 39.8     DDimer No results for input(s): DDIMER in the last 168 hours.   Radiology    US  Carotid Bilateral Result Date: 02/15/2024 IMPRESSION: 1. Positive for Very Severe Atherosclerotic Stenosis of the proximal Left ICA, was numerically estimated at 80% by CTA in 2023, could be Near Occlusive now. Recommend Vascular Surgery consultation. 2. Mild contralateral right carotid bifurcation atherosclerosis, probably not significantly changed from prior CTA. Electronically Signed   By: VEAR Hurst M.D.   On: 02/15/2024 05:19   MR LUMBAR SPINE WO CONTRAST Result Date: 02/15/2024 IMPRESSION: 1. L5-S1 mild spinal canal stenosis and mild bilateral neural foraminal stenosis 2.  L3-4 mild left foraminal stenosis Electronically signed by: Franky Stanford MD 02/15/2024 03:20 AM EDT RP Workstation: [id number]   MR CERVICAL SPINE WO CONTRAST Result Date:  02/15/2024 IMPRESSION: 1. C4-5 severe spinal canal stenosis and compression of the spinal cord with severe bilateral neuroforaminal stenosis. Allowing for differences in modality, this is likely unchanged from 01/23/2015. 2. C5-6 severe bilateral foraminal stenosis 3. C3-4 severe bilateral neural foraminal stenosis 4. C6-7 moderate left neural foraminal stenosis Electronically signed by: Franky Stanford MD 02/15/2024 03:11 AM EDT RP Workstation: [id number]    MR BRAIN WO CONTRAST Result Date: 02/14/2024 IMPRESSION: 1. Acute infarct in the posterior limb of the right internal capsule. 2. Background of moderate chronic small vessel disease with old infarcts along the left middle frontal gyrus and medial aspect of the left cerebellar hemisphere. Electronically signed by: Navika Hoopes Chess MD 02/14/2024 12:40 PM EDT RP Workstation: [id number]   US  Renal Result Date: 02/14/2024 IMPRESSION: Changes consistent with medical renal disease. 10 mm nonobstructing left renal stone. No other focal abnormality is noted. Electronically Signed   By: Oneil Devonshire M.D.   On: 02/14/2024 02:13   DG Chest Portable 1 View Result Date: 02/13/2024 IMPRESSION: 1. No evidence of acute chest disease.  Stable COPD chest. 2. Mild cardiomegaly. 3. No evidence of pelvic fracture or diastasis. 4. Osteopenia and degenerative change. 5. Aortic atherosclerosis. 6. Moderate to advanced bilateral hip arthrosis. Electronically Signed   By: Francis Quam M.D.   On: 02/13/2024 23:23   DG Pelvis 1-2 Views Result Date: 02/13/2024 IMPRESSION: 1. No evidence of acute chest disease.  Stable COPD chest. 2. Mild cardiomegaly. 3. No evidence of pelvic fracture or diastasis. 4. Osteopenia and degenerative change. 5. Aortic atherosclerosis. 6. Moderate to advanced bilateral hip arthrosis. Electronically Signed   By: Francis Quam M.D.   On: 02/13/2024 23:23    Cardiac Studies   2D echo 02/14/2024: 1. Left ventricular ejection fraction, by estimation,  is 50 to 55%. The  left ventricle has low normal function. The left ventricle demonstrates  regional wall motion abnormalities (hypokinesis of the basal inferior  wall). There is moderate left  ventricular hypertrophy. Left ventricular diastolic parameters are  indeterminate.   2. Right ventricular systolic function is normal. The right ventricular  size is normal. Tricuspid regurgitation signal is inadequate for assessing  PA pressure.   3. Left atrial size was mildly dilated.   4. The mitral valve is normal in structure. Mild mitral valve  regurgitation. No evidence of mitral stenosis.   5. The aortic valve has an indeterminant number of cusps. Aortic valve  regurgitation is  not visualized. No aortic stenosis is present.   6. The inferior vena cava is normal in size with greater than 50%  respiratory variability, suggesting right atrial pressure of 3 mmHg.  __________   2D echo 02/26/2022: 1. Left ventricular ejection fraction, by estimation, is 60 to 65%. The  left ventricle has normal function. The left ventricle has no regional  wall motion abnormalities. There is mild left ventricular hypertrophy.  Left ventricular diastolic parameters  are consistent with Grade I diastolic dysfunction (impaired relaxation).   2. Right ventricular systolic function is normal. The right ventricular  size is normal. There is normal pulmonary artery systolic pressure. The  estimated right ventricular systolic pressure is 26.2 mmHg.   3. The mitral valve is degenerative. No evidence of mitral valve  regurgitation. Moderate mitral annular calcification.   4. The aortic valve was not well visualized. Aortic valve regurgitation  is not visualized. No aortic stenosis is present.   Patient Profile     89 y.o. female with history of diverticulosis complicated by lower GI bleeding in 02/2022 s/p embolization with recurrent bleed in 2024, possible Crohn's disease, recurrent CVA, severe left-sided carotid  artery stenosis, DM2, HTN, and HLD who was admitted to [facility] on 02/13/2024 with acute CVA complicated by acute renal failure and newly diagnosed A-fib, and is being seen today for the evaluation of newly diagnosed A-fib at the request of Dr. Leesa.   Assessment & Plan    1. Newly diagnosed A-fib with RVR:  - Presented to [facility] in sinus rhythm with development of A-fib with RVR at 19:15 a.m. 02/14/2024 and remains in rate controlled A-fib since with rates in the 60s to 70s bpm - Lopressor  50 mg twice daily  - For now, in the setting of acute CVA, anticipate rate control strategy with recommendation to pursue outpatient DCCV after she has been adequately anticoagulated without interruption for a minimum of 3 to 4 weeks - If however she has difficult to control ventricular rates or is symptomatic with ambulation while working with PT/OT, would need to pursue TEE guided DCCV prior to discharge (hopefully this can be avoided given recent CVA) - CHA2DS2-VASc at least 8 (HTN, age x 2, CVA x 2, DM, vascular disease, sex category)  - Patient has refused intervention of LICA stenosis; transition from heparin  drip to apixaban  5 mg bid - Patient does have a history of recurrent GI bleed, if she redevelops GI bleed on anticoagulation, would recommend EP evaluation for consideration of Watchman  - TSH normal - Magnesium  normal - Replete potassium as below  2. Elevated troponin:  - Never with symptoms of angina - Mildly elevated and flat trending, not consistent with ACS  - Likely supply/demand ischemia in the setting of CVA and rhabdomyolysis from a mechanical fall 2 days prior to admission complicated by acute renal failure  - DOAC as above - Echo this admission with low normal LV systolic function with hypokinesis of the basal inferior wall  - Patient not currently coronary CTA or cardiac cath candidate with acute renal failure  - Consider outpatient ischemic testing given lack of anginal symptoms and in the  context of acute CVA   3. Recurrent CVA:  - DOAC as above - Neurology consulted  - Currently on aspirin  and statin; consider stopping ASA   4. Carotid artery stenosis:  - At time of CVA in 2023 she was found to have high-grade left ICA stenosis of approximately 80%, recommend updating   - Carotid artery  ultrasound this admission with very severe atherosclerotic stenosis of the proximal left ICA nearly occlusive (contralateral to CVA) - Evaluated by vascular surgery, patient refusing intervention - To minimize bleeding risk, consider discontinuing aspirin  with initiation of DOAC - Now on atorvastatin  40 mg  5. AKI:  - Admission creatinine 3.3 with baseline around 0.8-0.9  - Improving to 1.45 - Cannot exclude hypertensive renal disease  - Avoid nephrotoxic agents  - Further management per internal medicine   6. Accelerated hypertension:  - Blood pressure labile over the past 24 hours - Monitor  - Currently on amlodipine  10 mg, clonidine  0.3 mg twice daily, and   - Lopressor  50 mg twice daily   7. Recurrent GI bleed:  - Denies symptoms for active bleeding  - Hemoglobin stable   8. Hypokalemia:  - Replete to goal 4.0  - Give KCl 30 mEq once  9. HLD:  - LDL 85 with target less than 70 - Atorvastatin  40 mg   10. Lower extremity weakness: - Not attributed to the CVA - Known significant cervical spinal stenosis - Does not want to pursue intervention - Functional status will be significantly limited in current state - PT/OT      For questions or updates, please contact [facility] Please consult [email or web address] for contact info under Cardiology/STEMI.    Signed, Bernardino Bring, PA-C [facility] Pager: [phone] 02/16/2024, 7:59 AM

## 2024-02-17 ENCOUNTER — Other Ambulatory Visit (HOSPITAL_COMMUNITY): Payer: Self-pay

## 2024-02-17 ENCOUNTER — Telehealth (HOSPITAL_COMMUNITY): Payer: Self-pay | Admitting: Pharmacy Technician

## 2024-02-17 DIAGNOSIS — W19XXXA Unspecified fall, initial encounter: Secondary | ICD-10-CM | POA: Diagnosis not present

## 2024-02-17 DIAGNOSIS — I4891 Unspecified atrial fibrillation: Secondary | ICD-10-CM | POA: Diagnosis not present

## 2024-02-17 DIAGNOSIS — R531 Weakness: Secondary | ICD-10-CM

## 2024-02-17 DIAGNOSIS — N179 Acute kidney failure, unspecified: Secondary | ICD-10-CM | POA: Diagnosis not present

## 2024-02-17 DIAGNOSIS — I739 Peripheral vascular disease, unspecified: Secondary | ICD-10-CM

## 2024-02-17 LAB — CBC WITH DIFFERENTIAL/PLATELET
Abs Immature Granulocytes: 0.05 K/uL (ref 0.00–0.07)
Basophils Absolute: 0.1 K/uL (ref 0.0–0.1)
Basophils Relative: 1 %
Eosinophils Absolute: 0.1 K/uL (ref 0.0–0.5)
Eosinophils Relative: 1 %
HCT: 42.7 % (ref 36.0–46.0)
Hemoglobin: 14.4 g/dL (ref 12.0–15.0)
Immature Granulocytes: 1 %
Lymphocytes Relative: 14 %
Lymphs Abs: 1.4 K/uL (ref 0.7–4.0)
MCH: 30.4 pg (ref 26.0–34.0)
MCHC: 33.7 g/dL (ref 30.0–36.0)
MCV: 90.1 fL (ref 80.0–100.0)
Monocytes Absolute: 1 K/uL (ref 0.1–1.0)
Monocytes Relative: 10 %
Neutro Abs: 7.3 K/uL (ref 1.7–7.7)
Neutrophils Relative %: 73 %
Platelets: 232 K/uL (ref 150–400)
RBC: 4.74 MIL/uL (ref 3.87–5.11)
RDW: 13.4 % (ref 11.5–15.5)
WBC: 9.8 K/uL (ref 4.0–10.5)
nRBC: 0 % (ref 0.0–0.2)

## 2024-02-17 LAB — COMPREHENSIVE METABOLIC PANEL WITH GFR
ALT: 77 U/L — ABNORMAL HIGH (ref 0–44)
AST: 74 U/L — ABNORMAL HIGH (ref 15–41)
Albumin: 2.6 g/dL — ABNORMAL LOW (ref 3.5–5.0)
Alkaline Phosphatase: 60 U/L (ref 38–126)
Anion gap: 11 (ref 5–15)
BUN: 35 mg/dL — ABNORMAL HIGH (ref 8–23)
CO2: 25 mmol/L (ref 22–32)
Calcium: 8.8 mg/dL — ABNORMAL LOW (ref 8.9–10.3)
Chloride: 97 mmol/L — ABNORMAL LOW (ref 98–111)
Creatinine, Ser: 1.29 mg/dL — ABNORMAL HIGH (ref 0.44–1.00)
GFR, Estimated: 40 mL/min — ABNORMAL LOW (ref >60)
Glucose, Bld: 115 mg/dL — ABNORMAL HIGH (ref 70–99)
Potassium: 3.9 mmol/L (ref 3.5–5.1)
Sodium: 133 mmol/L — ABNORMAL LOW (ref 135–145)
Total Bilirubin: 0.9 mg/dL (ref 0.0–1.2)
Total Protein: 6.4 g/dL — ABNORMAL LOW (ref 6.5–8.1)

## 2024-02-17 LAB — PHOSPHORUS: Phosphorus: 3.1 mg/dL (ref 2.5–4.6)

## 2024-02-17 LAB — GLUCOSE, CAPILLARY
Glucose-Capillary: 139 mg/dL — ABNORMAL HIGH (ref 70–99)
Glucose-Capillary: 155 mg/dL — ABNORMAL HIGH (ref 70–99)
Glucose-Capillary: 136 mg/dL — ABNORMAL HIGH (ref 70–99)
Glucose-Capillary: 183 mg/dL — ABNORMAL HIGH (ref 70–99)

## 2024-02-17 LAB — MAGNESIUM: Magnesium: 1.6 mg/dL — ABNORMAL LOW (ref 1.7–2.4)

## 2024-02-17 NOTE — TOC Progression Note (Signed)
 Transition of Care [facility]) - Progression Note    Patient Details  Name: Kaitlin Gomez MRN: [id number] Date of Birth: 08/25/1934  [facility] of [facility]) CM/SW Contact  Dalia GORMAN Fuse, RN Phone Number: 02/17/2024, 4:01 PM  Clinical Narrative:    SNF pending shara ID SNF pending ref # [id number]                      Expected Discharge Plan and Services                                               Social Drivers of Health (SDOH) Interventions SDOH Screenings   Food Insecurity: No Food Insecurity (09/13/2022)  Housing: Low Risk  (09/13/2022)  Transportation Needs: No Transportation Needs (09/13/2022)  Utilities: Not At Risk (09/13/2022)  Tobacco Use: Low Risk  (02/13/2024)    Readmission Risk Interventions    03/07/2022   12:00 PM  Readmission Risk Prevention Plan  Transportation Screening Complete  PCP or Specialist Appt within 5-7 Days Complete  Home Care Screening Complete  Medication Review (RN CM) Complete

## 2024-02-17 NOTE — Telephone Encounter (Signed)
 Patient Product/process development scientist completed.    The patient is insured through [employer]. Patient has Medicare and is not eligible for a copay card, but may be able to apply for patient assistance or Medicare RX Payment Plan (Patient Must reach out to their plan, if eligible for payment plan), if available.    Ran test claim for Eliquis 5 mg and the current 30 day co-pay is $152.71.   This test claim was processed through [facility]- copay amounts may vary at other pharmacies due to pharmacy/plan contracts, or as the patient moves through the different stages of their insurance plan.     Roland Earl, CPHT Pharmacy Technician III Certified Patient Advocate [facility] Patient Advocate Team Direct Number: [phone]  Fax: [phone]

## 2024-02-17 NOTE — Plan of Care (Signed)
  Problem: Activity: Goal: Risk for activity intolerance will decrease Outcome: Progressing   Problem: Nutrition: Goal: Adequate nutrition will be maintained Outcome: Progressing   Problem: Pain Managment: Goal: General experience of comfort will improve and/or be controlled Outcome: Progressing   Problem: Safety: Goal: Ability to remain free from injury will improve Outcome: Progressing   Problem: Skin Integrity: Goal: Risk for impaired skin integrity will decrease Outcome: Progressing   Problem: Self-Care: Goal: Ability to communicate needs accurately will improve Outcome: Progressing   Problem: Nutrition: Goal: Risk of aspiration will decrease Outcome: Progressing Goal: Dietary intake will improve Outcome: Progressing

## 2024-02-17 NOTE — Progress Notes (Signed)
 Physical Therapy Treatment Patient Details Name: Kaitlin Gomez MRN: [id number] DOB: [birth year] Today's Date: 02/17/2024   History of Present Illness Pt. is an 89 y.o. African-American female with medical history significant for type 2 diabetes mellitus, hypertension, dyslipidemia and chron's disease as well as carotid artery stenosis, who presented to the emergency room with acute onset of fall.    PT Comments  PT treatment focus on upper trunk activation/strengthening and control/balance , of activity progressing from Max A to Supervision level.  Pt's mobility continues to be limited 2/2 significant trunk and LE weakness and poor activity tolerance requiring Max A +2 for bed mobility and unable to perform sit<>stand transfer at this time 2/2 poor trunk control and LE weakness.  Continue to recommend post acute rehab <3 hours therapy/day upon d/c.     If plan is discharge home, recommend the following: Two people to help with walking and/or transfers;Two people to help with bathing/dressing/bathroom;Assistance with cooking/housework;Assist for transportation   Can travel by private vehicle     No  Equipment Recommendations  None recommended by PT    Recommendations for Other Services       Precautions / Restrictions Precautions Precautions: Fall Recall of Precautions/Restrictions: Intact Restrictions Weight Bearing Restrictions Per Provider Order: No     Mobility  Bed Mobility Overal bed mobility: Needs Assistance Bed Mobility: Supine to Sit, Sit to Supine     Supine to sit: Max assist, +2 for physical assistance Sit to supine: Max assist, +2 for physical assistance        Transfers                   General transfer comment: Poor trunk control and significant fatigue from activities seated EOB.  Deferred at this time.    Ambulation/Gait               General Gait Details: NT d/t safety   Stairs             Wheelchair Mobility     Tilt  Bed    Modified Rankin (Stroke Patients Only)       Balance Overall balance assessment: Needs assistance Sitting-balance support: Bilateral upper extremity supported, Feet supported Sitting balance-Leahy Scale: Poor Sitting balance - Comments: Heavy posterior lean that improved with prolonged sitting from max down to Supervision Postural control: Posterior lean     Standing balance comment: NT. Pt has poor sitting balance. Unable to stand at this time                            Communication Communication Communication: No apparent difficulties  Cognition Arousal: Alert Behavior During Therapy: WFL for tasks assessed/performed   PT - Cognitive impairments: No apparent impairments                       PT - Cognition Comments: pleasant and agreeable to PT session Following commands: Intact      Cueing Cueing Techniques: Verbal cues, Tactile cues, Visual cues  Exercises Other Exercises Other Exercises: Upper trunk strengthening and stability activities:  progressing from 2 UE support to 1 to No UE support with activities Assisted pushing with hand against manual resistance, progressed with Active resisted pushing shoulders/trunk into maual resistance; transistioned into upper trunk lateral and A/P weight shifts.  of activity progressing from Max A to Supervision level.    General Comments  Pertinent Vitals/Pain Pain Assessment Pain Assessment: Faces Faces Pain Scale: Hurts even more Pain Descriptors / Indicators: Aching Pain Intervention(s): Limited activity within patient's tolerance, Monitored during session    Home Living                          Prior Function            PT Goals (current goals can now be found in the care plan section) Acute Rehab PT Goals Patient Stated Goal: to get moving PT Goal Formulation: With patient Time For Goal Achievement: 02/28/24 Potential to Achieve Goals: Fair Progress towards PT  goals: Progressing toward goals    Frequency    Min 2X/week      PT Plan      Co-evaluation              AM-PAC PT 6 Clicks Mobility   Outcome Measure  Help needed turning from your back to your side while in a flat bed without using bedrails?: A Lot Help needed moving from lying on your back to sitting on the side of a flat bed without using bedrails?: A Lot Help needed moving to and from a bed to a chair (including a wheelchair)?: A Lot Help needed standing up from a chair using your arms (e.g., wheelchair or bedside chair)?: A Lot Help needed to walk in hospital room?: A Lot Help needed climbing 3-5 steps with a railing? : Total 6 Click Score: 11    End of Session   Activity Tolerance: Patient limited by fatigue Patient left: in bed;with call bell/phone within reach;with bed alarm set   PT Visit Diagnosis: Unsteadiness on feet (R26.81);Muscle weakness (generalized) (M62.81);History of falling (Z91.81);Difficulty in walking, not elsewhere classified (R26.2)     Time: 9064-9042 PT Time Calculation (min) (ACUTE ONLY): 22 min  Charges:    $Therapeutic Activity: 8-22 mins PT General Charges $$ ACUTE PT VISIT: 1 Visit                     Harland Irving, PTA  02/17/24, 11:18 AM

## 2024-02-17 NOTE — Progress Notes (Signed)
 PROGRESS NOTE    Kaitlin Gomez  FMW:[id number] DOB: [birth year] DOA: 02/13/2024 PCP: [place] HERO, MD  Chief Complaint  Patient presents with   [facility] Course:  Kaitlin Gomez is an 89 year old female with prior CVA, CAD, type 2 diabetes, hypertension, dyslipidemia, carotid disease, known coronary artery stenosis, who presents to the emergency department in which she woke up and felt weak in both legs.  She reports she tried to walk but was unable to do so. On arrival to ED BP was 145/59, heart rate 56, creatinine 3.3, CKD 729.  High-sensitivity troponin 39 with repeat at 52.  EKG revealed NSR, QTc 49, rate 75.  CXR and pelvic x-ray without acute process.  Renal ultrasound revealed changes consistent with medical renal disease and a 10 mm obstructing left renal stone with no other focal abnormalities.  She was given IV fluid bolus and admitted for management.  Subjective: No acute events overnight.  Patient is still very weak.  Remains optimistic for discharge to rehab facility  Objective: Vitals:   02/16/24 2345 02/17/24 0417 02/17/24 0738 02/17/24 1200  BP: 125/75 (!) 159/72 (!) 173/94 (!) 158/76  Pulse: 95 73  65  Resp:   20 18  Temp: 99.4 F (37.4 C) 98.8 F (37.1 C) 98.9 F (37.2 C) 98.8 F (37.1 C)  TempSrc:    Oral  SpO2: 99% 100% 100% 100%  Weight:      Height:        Intake/Output Summary (Last 24 hours) at 02/17/2024 1542 Last data filed at 02/16/2024 1900 Gross per 24 hour  Intake 240 ml  Output --  Net 240 ml   Filed Weights   02/13/24 2018 02/14/24 0151  Weight: 79.4 kg 96.4 kg    Examination: General exam: Appears calm and comfortable, NAD  Respiratory system: No work of breathing, symmetric chest wall expansion Cardiovascular system: S1 & S2 heard, RRR.  Gastrointestinal system: Abdomen is nondistended, soft and nontender.  Neuro: Alert and oriented.  Drowsy but easily arousable Skin: No rashes, lesions Psychiatry: Demonstrates appropriate  judgement and insight. Mood & affect appropriate for situation.   Assessment & Plan:  Principal Problem:   AKI (acute kidney injury) (HCC) Active Problems:   Hypertensive urgency   Fall at home, initial encounter   Dyslipidemia   Type 2 diabetes mellitus without complications (HCC)   Weakness   Atrial fibrillation with RVR (HCC)   Acute stroke due to ischemia [facility])   PAD (peripheral artery disease)   Stenosis of carotid artery    Acute posterior CVA - Left upper extremity weakness, profound lower extremity weakness. - Brain MRI ordered: Acute infarct in posterior limb of right internal capsule.  Patient is 24 hours out from event. - Echocardiogram: EF 50 to 55%, hypokinesis of the basal inferior wall with moderate left ventricular hypertrophy.  Diastolic parameters indeterminate. - LDL 107, statin initiated - Hemoglobin A1c at goal 6.0% - Continue PT/OT while admitted.  Plan to discharge to SNF. - Continue monitoring on telemetry.  Lower extremity weakness - Greater than expected given stroke findings.  Cervical spine MRI reveals spinal stenosis at C4-5 with cord compression though this is chronic.  Suspect her weakness is multifactorial from new CVA and chronic spinal stenosis. - Patient has declined surgical intervention with neurosurgery or vascular surgery. - Continue with PT/OT, TOC consulted for SNF placement.  Coronary artery stenosis, left severe - Known prior to arrival, but is consistently less than 80% so  was being followed outpatient - Carotid duplex this admission: Mild right ICA and severe left ICA stenosis, possibly representing complete occlusion - Vascular surgery was consulted and has recommended CTA however patient refuses surgical intervention even if offered.  Will discontinue further workup and proceed with aspirin , statin, DOAC as above  History of CVA - 2023 patient was diagnosed with bilateral small embolic infarcts thought to be cardioembolic though  atrial fibrillation was not diagnosed at that time.  At that time she was determined not to be a candidate for anticoagulation due to recent GI bleed and anemia.  She did receive loop recorder during that time.  New onset atrial fibrillation, with RVR - Patient denies history of this.  Did have cardiac monitor in the past and there was concern of possible A-fib in the past from likely cardioembolic CVA 2023 - Did have RVR this admission which resolved with one-time push metoprolol .  RVR again overnight which self resolved. - Currently rate controlled on Lopressor  - Continue monitoring on telemetry - Cardiology consulted, no plans to restore NSR given recent CVA - Continue DOAC - Cardiology to consider ischemic workup outpatient - Echo with preserved Ef, hypokinesis of the basal anterior wall with moderate LVH. - TSH WNL  Hypertensive urgency ruled out - Blood pressure overnight 9/19 reported to be severely elevated.  This was blood pressure cuff issue.  When measured accurately blood pressures have been normotensive.  Reading drip was initially ordered but not needed.  Hypertension - Normotensive blood pressure goals now - Initially started on amlodipine  and clonidine  given AKI on arrival.  Blood pressure better controlled now.  Discontinue clonidine .  Increase amlodipine  to 10 daily.  Further med titration daily - As needed hydralazine  and labetalol  remain available  AKI - Baseline creatinine appears to be 0.88 from 1 year ago.  Creatinine 3.3 on arrival with elevated BUN. - Likely prerenal given poor p.o. intake and downtime.  Also likely some degree of ATN given mild rhabdomyolysis and possible hypotension at home with A-fib RVR. - Creatinine continues to resolve - Will discontinue daily CMP, recheck in 1 week to ensure continued downtrend - Creatinine clearance 36, renally dose when needed  Rhabdomyolysis, nontraumatic - Resolved.  10 mm nonobstructing left renal stone - Aware.   No flank pain currently.  Stone is nonobstructive.  She is currently on IV fluids, will continue.  Monitor creatinine.  Type 2 diabetes - Hemoglobin A1c 6.0% - No indication for sliding scale insulin  at this time.  DVT prophylaxis: Eliquis    Code Status: Full Code Disposition: Medically ready for discharge.  Pending SNF placement.  TOC aware working on it.  FL 2 signed today  Consultants:    Procedures:    Antimicrobials:  Anti-infectives (From admission, onward)    None       Data Reviewed: I have personally reviewed following labs and imaging studies CBC: Recent Labs  Lab 02/14/24 0124 02/14/24 0555 02/15/24 1131 02/16/24 2215 02/17/24 0609  WBC 11.3* 10.5 8.2 9.8 9.8  NEUTROABS  --   --  6.5 7.0 7.3  HGB 15.4* 15.6* 14.7 15.4* 14.4  HCT 46.0 47.2* 44.0 46.1* 42.7  MCV 89.7 90.9 91.7 90.7 90.1  PLT 212 206 177 238 232   Basic Metabolic Panel: Recent Labs  Lab 02/13/24 2019 02/14/24 0124 02/14/24 0555 02/14/24 1552 02/15/24 1131 02/16/24 1844 02/17/24 0609  NA 139  --  140  --  135 135 133*  K 3.9  --  3.3*  --  3.4* 4.6 3.9  CL 102  --  100  --  97* 99 97*  CO2 23  --  25  --  25 25 25   GLUCOSE 149*  --  136*  --  195* 140* 115*  BUN 54*  --  49*  --  38* 40* 35*  CREATININE 3.30* 2.85* 2.51*  --  1.45* 1.18* 1.29*  CALCIUM  9.3  --  9.3  --  8.6* 9.4 8.8*  MG  --   --   --  1.7  --  1.8 1.6*  PHOS  --   --   --   --   --  3.2 3.1   GFR: Estimated Creatinine Clearance: 36.6 mL/min (A) (by C-G formula based on SCr of 1.29 mg/dL (H)). Liver Function Tests: Recent Labs  Lab 02/13/24 2019 02/15/24 1131 02/16/24 1844 02/17/24 0609  AST 30 31 73* 74*  ALT 21 22 58* 77*  ALKPHOS 58 53 63 60  BILITOT 1.3* 0.9 1.0 0.9  PROT 7.2 5.9* 6.7 6.4*  ALBUMIN 3.5 2.6* 2.8* 2.6*   CBG: Recent Labs  Lab 02/16/24 1211 02/16/24 1814 02/16/24 2135 02/17/24 0741 02/17/24 1227  GLUCAP 154* 131* 165* 139* 155*    No results found for this or any previous  visit (from the past 240 hours).   Radiology Studies: No results found.   Scheduled Meds:  amLODipine   10 mg Oral Daily   apixaban   5 mg Oral BID   aspirin  EC  81 mg Oral Daily   atorvastatin   40 mg Oral Daily   calcium -vitamin D   1 tablet Oral Daily   latanoprost   1 drop Both Eyes QHS   metoprolol  tartrate  50 mg Oral BID   nystatin  cream   Topical BID   Continuous Infusions:     LOS: 3 days  MDM: Patient is high risk for one or more organ failure.  They necessitate ongoing hospitalization for continued IV therapies and subsequent lab monitoring. Total time spent interpreting labs and vitals, reviewing the medical record, coordinating care amongst consultants and care team members, directly assessing and discussing care with the patient and/or family: 55 min   Georgana Romain, DO [facility]  To contact the attending physician between 7A-7P please use Epic Chat. To contact the covering physician during after hours 7P-7A, please review Amion.  02/17/2024, 3:42 PM   *This document has been created with the assistance of dictation software. Please excuse typographical errors. *

## 2024-02-18 LAB — COMPREHENSIVE METABOLIC PANEL WITH GFR
ALT: 84 U/L — ABNORMAL HIGH (ref 0–44)
AST: 55 U/L — ABNORMAL HIGH (ref 15–41)
Albumin: 2.8 g/dL — ABNORMAL LOW (ref 3.5–5.0)
Alkaline Phosphatase: 71 U/L (ref 38–126)
Anion gap: 10 (ref 5–15)
BUN: 38 mg/dL — ABNORMAL HIGH (ref 8–23)
CO2: 26 mmol/L (ref 22–32)
Calcium: 9.3 mg/dL (ref 8.9–10.3)
Chloride: 98 mmol/L (ref 98–111)
Creatinine, Ser: 1.18 mg/dL — ABNORMAL HIGH (ref 0.44–1.00)
GFR, Estimated: 45 mL/min — ABNORMAL LOW (ref >60)
Glucose, Bld: 133 mg/dL — ABNORMAL HIGH (ref 70–99)
Potassium: 4 mmol/L (ref 3.5–5.1)
Sodium: 134 mmol/L — ABNORMAL LOW (ref 135–145)
Total Bilirubin: 0.7 mg/dL (ref 0.0–1.2)
Total Protein: 6.8 g/dL (ref 6.5–8.1)

## 2024-02-18 LAB — CBC WITH DIFFERENTIAL/PLATELET
Abs Immature Granulocytes: 0.05 K/uL (ref 0.00–0.07)
Basophils Absolute: 0.1 K/uL (ref 0.0–0.1)
Basophils Relative: 1 %
Eosinophils Absolute: 0.1 K/uL (ref 0.0–0.5)
Eosinophils Relative: 1 %
HCT: 45.7 % (ref 36.0–46.0)
Hemoglobin: 15.3 g/dL — ABNORMAL HIGH (ref 12.0–15.0)
Immature Granulocytes: 1 %
Lymphocytes Relative: 13 %
Lymphs Abs: 1.3 K/uL (ref 0.7–4.0)
MCH: 30.5 pg (ref 26.0–34.0)
MCHC: 33.5 g/dL (ref 30.0–36.0)
MCV: 91 fL (ref 80.0–100.0)
Monocytes Absolute: 1 K/uL (ref 0.1–1.0)
Monocytes Relative: 11 %
Neutro Abs: 7.1 K/uL (ref 1.7–7.7)
Neutrophils Relative %: 73 %
Platelets: 270 K/uL (ref 150–400)
RBC: 5.02 MIL/uL (ref 3.87–5.11)
RDW: 13.6 % (ref 11.5–15.5)
WBC: 9.6 K/uL (ref 4.0–10.5)
nRBC: 0 % (ref 0.0–0.2)

## 2024-02-18 LAB — GLUCOSE, CAPILLARY
Glucose-Capillary: 125 mg/dL — ABNORMAL HIGH (ref 70–99)
Glucose-Capillary: 164 mg/dL — ABNORMAL HIGH (ref 70–99)
Glucose-Capillary: 138 mg/dL — ABNORMAL HIGH (ref 70–99)
Glucose-Capillary: 168 mg/dL — ABNORMAL HIGH (ref 70–99)

## 2024-02-18 LAB — MAGNESIUM: Magnesium: 1.8 mg/dL (ref 1.7–2.4)

## 2024-02-18 LAB — PHOSPHORUS: Phosphorus: 2.9 mg/dL (ref 2.5–4.6)

## 2024-02-18 MED ORDER — CLONIDINE HCL 0.1 MG PO TABS
0.3000 mg | ORAL_TABLET | Freq: Two times a day (BID) | ORAL | Status: DC
  Administered 2024-02-18 – 2024-02-19 (×4): 0.3 mg via ORAL
  Filled 2024-02-18 (×4): qty 3

## 2024-02-18 NOTE — Progress Notes (Signed)
 PROGRESS NOTE    Kaitlin Gomez  FMW:[id number] DOB: July 28, 1934 DOA: 02/13/2024 PCP: [place] HERO, MD  Chief Complaint  Patient presents with   [facility] Course:  Kaitlin Gomez is an 89 year old female with prior CVA, CAD, type 2 diabetes, hypertension, dyslipidemia, carotid disease, known coronary artery stenosis, who presents to the emergency department in which she woke up and felt weak in both legs.  She reports she tried to walk but was unable to do so.  Ultimately patient was found to have acute posterior CVA, severe left left coronary artery stenosis, new onset A-fib with RVR.  Cardiology, neurology, vascular surgery consulted.  Ultimately patient has refused any surgical intervention and we are proceeding with medical management only.  Stay has now been prolonged since 8/21 pending insurance authorization for SNF.  Subjective: No acute events overnight.  Patient is still very weak.  Remains optimistic for discharge to rehab facility  Objective: Vitals:   02/18/24 0350 02/18/24 0804 02/18/24 1212 02/18/24 1639  BP: (!) 164/79 (!) 151/87 (!) 157/75 (!) 140/76  Pulse: 75 88 78 76  Resp: 16 18 17 19   Temp: 98.4 F (36.9 C) 97.6 F (36.4 C)  98.2 F (36.8 C)  TempSrc:  Oral    SpO2: 100% 97% 100% 100%  Weight:      Height:        Intake/Output Summary (Last 24 hours) at 02/18/2024 1700 Last data filed at 02/18/2024 0900 Gross per 24 hour  Intake 120 ml  Output 300 ml  Net -180 ml   Filed Weights   02/13/24 2018 02/14/24 0151  Weight: 79.4 kg 96.4 kg    Examination: General exam: Appears calm and comfortable, NAD  Respiratory system: No work of breathing, symmetric chest wall expansion Cardiovascular system: S1 & S2 heard, RRR.  Gastrointestinal system: Abdomen is nondistended, soft and nontender.  Neuro: Alert and oriented Skin: No rashes, lesions Psychiatry: Demonstrates appropriate judgement and insight. Mood & affect appropriate for situation.    Assessment & Plan:  Principal Problem:   AKI (acute kidney injury) Active Problems:   Hypertensive urgency   Fall at home, initial encounter   Dyslipidemia   Type 2 diabetes mellitus without complications (HCC)   Weakness   Atrial fibrillation with RVR (HCC)   Acute stroke due to ischemia [facility])   PAD (peripheral artery disease)   Stenosis of carotid artery    Acute posterior CVA - Left upper extremity weakness, profound lower extremity weakness. - Brain MRI ordered: Acute infarct in posterior limb of right internal capsule.  Patient is 24 hours out from event. - Echocardiogram: EF 50 to 55%, hypokinesis of the basal inferior wall with moderate left ventricular hypertrophy.  Diastolic parameters indeterminate. - LDL 107, statin initiated - Hemoglobin A1c at goal 6.0% - Continue PT/OT while admitted.  Plan to discharge to SNF. - Continue monitoring on telemetry.  Lower extremity weakness - Greater than expected given stroke findings.  Cervical spine MRI reveals spinal stenosis at C4-5 with cord compression though this is chronic.  Suspect her weakness is multifactorial from new CVA and chronic spinal stenosis. - Patient has declined surgical intervention with neurosurgery or vascular surgery. - Continue with PT/OT, TOC consulted for SNF placement.  Coronary artery stenosis, left severe - Known prior to arrival, but is consistently less than 80% so was being followed outpatient - Carotid duplex this admission: Mild right ICA and severe left ICA stenosis, possibly representing complete occlusion - Vascular  surgery was consulted and has recommended CTA however patient refuses surgical intervention even if offered.  Will discontinue further workup and proceed with aspirin , statin, DOAC as above  History of CVA - 2023 patient was diagnosed with bilateral small embolic infarcts thought to be cardioembolic though atrial fibrillation was not diagnosed at that time.  At that time she was  determined not to be a candidate for anticoagulation due to recent GI bleed and anemia.  She did receive loop recorder during that time.  New onset atrial fibrillation, with RVR - Patient denies history of this.  Did have cardiac monitor in the past and there was concern of possible A-fib in the past from likely cardioembolic CVA 2023 - Did have RVR this admission which resolved with one-time push metoprolol .  RVR again overnight which self resolved. - Currently rate controlled on Lopressor  - Continue monitoring on telemetry - Cardiology consulted, no plans to restore NSR given recent CVA - Continue DOAC - Cardiology to consider ischemic workup outpatient - Echo with preserved Ef, hypokinesis of the basal anterior wall with moderate LVH. - TSH WNL  Hypertensive urgency ruled out - Blood pressure overnight 9/19 reported to be severely elevated.  This was blood pressure cuff issue.  When measured accurately blood pressures have been normotensive.  Reading drip was initially ordered but not needed.  Hypertension - Normotensive blood pressure goals now - Continue daily titration of medications.  Currently on amlodipine  and clonidine . - As needed hydralazine  and labetalol  remain available  AKI - Baseline creatinine appears to be 0.88 from 1 year ago.  Creatinine 3.3 on arrival with elevated BUN. - Likely prerenal given poor p.o. intake and downtime.  Also likely some degree of ATN given mild rhabdomyolysis and possible hypotension at home with A-fib RVR. - Creatinine continues to resolve - Will discontinue daily CMP, recheck in 1 week to ensure continued downtrend - Creatinine clearance 36, renally dose when needed  Rhabdomyolysis, nontraumatic - Resolved.  10 mm nonobstructing left renal stone - Aware.  No flank pain currently.  Stone is nonobstructive.  She is currently on IV fluids, will continue.  Monitor creatinine.  Type 2 diabetes - Hemoglobin A1c 6.0% - No indication for  sliding scale insulin  at this time.  DVT prophylaxis: Eliquis    Code Status: Full Code Disposition: Medically ready for discharge.  Pending SNF placement.  TOC aware working on it.  Discharge order placed per DWD protocol.  Consultants:    Procedures:    Antimicrobials:  Anti-infectives (From admission, onward)    None       Data Reviewed: I have personally reviewed following labs and imaging studies CBC: Recent Labs  Lab 02/14/24 0555 02/15/24 1131 02/16/24 2215 02/17/24 0609 02/18/24 0322  WBC 10.5 8.2 9.8 9.8 9.6  NEUTROABS  --  6.5 7.0 7.3 7.1  HGB 15.6* 14.7 15.4* 14.4 15.3*  HCT 47.2* 44.0 46.1* 42.7 45.7  MCV 90.9 91.7 90.7 90.1 91.0  PLT 206 177 238 232 270   Basic Metabolic Panel: Recent Labs  Lab 02/14/24 0555 02/14/24 1552 02/15/24 1131 02/16/24 1844 02/17/24 0609 02/18/24 0322  NA 140  --  135 135 133* 134*  K 3.3*  --  3.4* 4.6 3.9 4.0  CL 100  --  97* 99 97* 98  CO2 25  --  25 25 25 26   GLUCOSE 136*  --  195* 140* 115* 133*  BUN 49*  --  38* 40* 35* 38*  CREATININE 2.51*  --  1.45* 1.18* 1.29* 1.18*  CALCIUM  9.3  --  8.6* 9.4 8.8* 9.3  MG  --  1.7  --  1.8 1.6* 1.8  PHOS  --   --   --  3.2 3.1 2.9   GFR: Estimated Creatinine Clearance: 40 mL/min (A) (by C-G formula based on SCr of 1.18 mg/dL (H)). Liver Function Tests: Recent Labs  Lab 02/13/24 2019 02/15/24 1131 02/16/24 1844 02/17/24 0609 02/18/24 0322  AST 30 31 73* 74* 55*  ALT 21 22 58* 77* 84*  ALKPHOS 58 53 63 60 71  BILITOT 1.3* 0.9 1.0 0.9 0.7  PROT 7.2 5.9* 6.7 6.4* 6.8  ALBUMIN 3.5 2.6* 2.8* 2.6* 2.8*   CBG: Recent Labs  Lab 02/17/24 1227 02/17/24 1708 02/17/24 2041 02/18/24 0801 02/18/24 1213  GLUCAP 155* 136* 183* 125* 164*    No results found for this or any previous visit (from the past 240 hours).   Radiology Studies: No results found.   Scheduled Meds:  amLODipine   10 mg Oral Daily   apixaban   5 mg Oral BID   aspirin  EC  81 mg Oral Daily    atorvastatin   40 mg Oral Daily   calcium -vitamin D   1 tablet Oral Daily   cloNIDine   0.3 mg Oral BID   latanoprost   1 drop Both Eyes QHS   metoprolol  tartrate  50 mg Oral BID   nystatin  cream   Topical BID   Continuous Infusions:     LOS: 4 days  MDM: Patient is high risk for one or more organ failure.  They necessitate ongoing hospitalization for continued IV therapies and subsequent lab monitoring. Total time spent interpreting labs and vitals, reviewing the medical record, coordinating care amongst consultants and care team members, directly assessing and discussing care with the patient and/or family: 55 min   Mackay Hanauer, DO [facility]  To contact the attending physician between 7A-7P please use Epic Chat. To contact the covering physician during after hours 7P-7A, please review Amion.  02/18/2024, 5:00 PM   *This document has been created with the assistance of dictation software. Please excuse typographical errors. *

## 2024-02-18 NOTE — TOC Progression Note (Signed)
 Transition of Care [facility]) - Progression Note    Patient Details  Name: Kaitlin Gomez MRN: [id number] Date of Birth: 04-Sep-1934  [facility] of [facility] CM/SW Contact  Dalia GORMAN Fuse, RN Phone Number: 02/18/2024, 3:41 PM  Clinical Narrative:      Insurance shara is pending for [facility].                   Expected Discharge Plan and Services                                               Social Drivers of Health (SDOH) Interventions SDOH Screenings   Food Insecurity: No Food Insecurity (02/17/2024)  Housing: Low Risk  (02/17/2024)  Transportation Needs: No Transportation Needs (02/17/2024)  Utilities: Not At Risk (02/17/2024)  Social Connections: Socially Isolated (02/17/2024)  Tobacco Use: Low Risk  (02/13/2024)    Readmission Risk Interventions    03/07/2022   12:00 PM  Readmission Risk Prevention Plan  Transportation Screening Complete  PCP or Specialist Appt within 5-7 Days Complete  Home Care Screening Complete  Medication Review (RN CM) Complete

## 2024-02-18 NOTE — Progress Notes (Signed)
 Occupational Therapy Treatment Patient Details Name: Kaitlin Gomez MRN: [id number] DOB: 18-Apr-1935 Today's Date: 02/18/2024   History of present illness Pt. is an 89 y.o. African-American female with medical history significant for type 2 diabetes mellitus, hypertension, dyslipidemia and chron's disease as well as carotid artery stenosis, who presented to the emergency room with acute onset of fall.   OT comments  Upon entering the room, pt supine in bed and agreeable to OT intervention. Pt needing max A for bed mobility. Pt needing mod A progressing to SBA for static sitting balance with posterior bias on EOB. Pt stands x 2 reps with max A from standard bed height. On second stand, pt takes several side steps with RW with mod A towards the L. Pt needing max A to returns to supine at end of session. Call bell and all needed items within reach upon exiting the room.       If plan is discharge home, recommend the following:  A lot of help with walking and/or transfers;A lot of help with bathing/dressing/bathroom;Assistance with cooking/housework;Direct supervision/assist for medications management   Equipment Recommendations  Other (comment) (defer to next venue of care)       Precautions / Restrictions Precautions Precautions: Fall Recall of Precautions/Restrictions: Intact       Mobility Bed Mobility Overal bed mobility: Needs Assistance Bed Mobility: Supine to Sit, Sit to Supine     Supine to sit: Max assist Sit to supine: Max assist        Transfers Overall transfer level: Needs assistance Equipment used: Rolling walker (2 wheels) Transfers: Sit to/from Stand Sit to Stand: Max assist                 Balance Overall balance assessment: Needs assistance   Sitting balance-Leahy Scale: Fair   Postural control: Posterior lean Standing balance support: During functional activity, Bilateral upper extremity supported Standing balance-Leahy Scale: Fair                              ADL either performed or assessed with clinical judgement    Extremity/Trunk Assessment Upper Extremity Assessment Upper Extremity Assessment: Generalized weakness   Lower Extremity Assessment Lower Extremity Assessment: Generalized weakness   Cervical / Trunk Assessment Cervical / Trunk Assessment: Kyphotic    Vision Patient Visual Report: No change from baseline           Communication Communication Communication: No apparent difficulties   Cognition Arousal: Alert Behavior During Therapy: WFL for tasks assessed/performed Cognition: No apparent impairments                               Following commands: Intact        Cueing   Cueing Techniques: Verbal cues, Tactile cues, Visual cues             Pertinent Vitals/ Pain       Pain Assessment Pain Assessment: Faces Faces Pain Scale: Hurts little more Pain Location: BLEs Pain Descriptors / Indicators: Discomfort Pain Intervention(s): Monitored during session, Limited activity within patient's tolerance         Frequency  Min 1X/week        Progress Toward Goals  OT Goals(current goals can now be found in the care plan section)  Progress towards OT goals: Progressing toward goals  Acute Rehab OT Goals Time For Goal Achievement: 02/29/24   AM-PAC OT  6 Clicks Daily Activity     Outcome Measure   Help from another person eating meals?: A Little Help from another person taking care of personal grooming?: A Lot Help from another person toileting, which includes using toliet, bedpan, or urinal?: A Lot Help from another person bathing (including washing, rinsing, drying)?: A Lot Help from another person to put on and taking off regular upper body clothing?: A Lot Help from another person to put on and taking off regular lower body clothing?: A Lot 6 Click Score: 13    End of Session Equipment Utilized During Treatment: Rolling walker (2 wheels)  OT Visit  Diagnosis: Unsteadiness on feet (R26.81)   Activity Tolerance Patient tolerated treatment well   Patient Left with call bell/phone within reach;with bed alarm set;in bed   Nurse Communication          Time: 9077-9054 OT Time Calculation (min): 23 min  Charges: OT General Charges $OT Visit: 1 Visit OT Treatments $Therapeutic Activity: 23-37 mins  Izetta Claude, MS, OTR/L , CBIS ascom [phone]  02/18/24, 10:47 AM

## 2024-02-18 NOTE — Care Management Important Message (Signed)
 Important Message  Patient Details  Name: Kaitlin Gomez MRN: [id number] Date of Birth: 10-10-1934   Important Message Given:  Yes - Medicare IM     Modean Mccullum W, CMA 02/18/2024, 9:51 AM

## 2024-02-18 NOTE — Plan of Care (Signed)
  Problem: Safety: Goal: Ability to remain free from injury will improve Outcome: Progressing   Problem: Education: Goal: Knowledge of disease or condition will improve Outcome: Progressing   Problem: Ischemic Stroke/TIA Tissue Perfusion: Goal: Complications of ischemic stroke/TIA will be minimized Outcome: Progressing   Problem: Self-Care: Goal: Ability to communicate needs accurately will improve Outcome: Progressing   Problem: Nutrition: Goal: Risk of aspiration will decrease Outcome: Progressing

## 2024-02-18 NOTE — Plan of Care (Signed)
   Problem: Education: Goal: Ability to describe self-care measures that may prevent or decrease complications (Diabetes Survival Skills Education) will improve Outcome: Progressing   Problem: Nutritional: Goal: Maintenance of adequate nutrition will improve Outcome: Progressing

## 2024-02-19 LAB — GLUCOSE, CAPILLARY
Glucose-Capillary: 116 mg/dL — ABNORMAL HIGH (ref 70–99)
Glucose-Capillary: 172 mg/dL — ABNORMAL HIGH (ref 70–99)
Glucose-Capillary: 130 mg/dL — ABNORMAL HIGH (ref 70–99)

## 2024-02-19 LAB — PHOSPHORUS: Phosphorus: 3.6 mg/dL (ref 2.5–4.6)

## 2024-02-19 LAB — COMPREHENSIVE METABOLIC PANEL WITH GFR
ALT: 55 U/L — ABNORMAL HIGH (ref 0–44)
AST: 27 U/L (ref 15–41)
Albumin: 2.7 g/dL — ABNORMAL LOW (ref 3.5–5.0)
Alkaline Phosphatase: 56 U/L (ref 38–126)
Anion gap: 7 (ref 5–15)
BUN: 44 mg/dL — ABNORMAL HIGH (ref 8–23)
CO2: 27 mmol/L (ref 22–32)
Calcium: 9.2 mg/dL (ref 8.9–10.3)
Chloride: 101 mmol/L (ref 98–111)
Creatinine, Ser: 1.12 mg/dL — ABNORMAL HIGH (ref 0.44–1.00)
GFR, Estimated: 48 mL/min — ABNORMAL LOW (ref >60)
Glucose, Bld: 137 mg/dL — ABNORMAL HIGH (ref 70–99)
Potassium: 4.3 mmol/L (ref 3.5–5.1)
Sodium: 135 mmol/L (ref 135–145)
Total Bilirubin: 0.5 mg/dL (ref 0.0–1.2)
Total Protein: 6.2 g/dL — ABNORMAL LOW (ref 6.5–8.1)

## 2024-02-19 LAB — CBC WITH DIFFERENTIAL/PLATELET
Abs Immature Granulocytes: 0.09 K/uL — ABNORMAL HIGH (ref 0.00–0.07)
Basophils Absolute: 0.1 K/uL (ref 0.0–0.1)
Basophils Relative: 1 %
Eosinophils Absolute: 0.2 K/uL (ref 0.0–0.5)
Eosinophils Relative: 2 %
HCT: 41.2 % (ref 36.0–46.0)
Hemoglobin: 13.5 g/dL (ref 12.0–15.0)
Immature Granulocytes: 1 %
Lymphocytes Relative: 17 %
Lymphs Abs: 1.5 K/uL (ref 0.7–4.0)
MCH: 30.3 pg (ref 26.0–34.0)
MCHC: 32.8 g/dL (ref 30.0–36.0)
MCV: 92.6 fL (ref 80.0–100.0)
Monocytes Absolute: 0.8 K/uL (ref 0.1–1.0)
Monocytes Relative: 10 %
Neutro Abs: 5.9 K/uL (ref 1.7–7.7)
Neutrophils Relative %: 69 %
Platelets: 281 K/uL (ref 150–400)
RBC: 4.45 MIL/uL (ref 3.87–5.11)
RDW: 13.8 % (ref 11.5–15.5)
WBC: 8.6 K/uL (ref 4.0–10.5)
nRBC: 0 % (ref 0.0–0.2)

## 2024-02-19 LAB — MAGNESIUM: Magnesium: 1.9 mg/dL (ref 1.7–2.4)

## 2024-02-19 MED ORDER — NYSTATIN 100000 UNIT/GM EX CREA: TOPICAL_CREAM | Freq: Two times a day (BID) | CUTANEOUS | Status: AC

## 2024-02-19 MED ORDER — APIXABAN 5 MG PO TABS: 5.0000 mg | ORAL_TABLET | Freq: Two times a day (BID) | ORAL | Status: AC

## 2024-02-19 MED ORDER — ATORVASTATIN CALCIUM 40 MG PO TABS: 40.0000 mg | ORAL_TABLET | Freq: Every day | ORAL | Status: AC

## 2024-02-19 NOTE — Discharge Summary (Signed)
 Physician Discharge Summary   Kaitlin Gomez  female DOB: [birth year]  FMW:[id number]  PCP: Buren Rock HERO, MD  Admit date: 02/13/2024 Discharge date: 02/18/2024.  Pt left the hospital on 02/19/24. Admitted From: home Disposition:  SNF rehab CODE STATUS: Full code  Discharge Instructions     Diet - low sodium heart healthy   Complete by: As directed       Hospital Course:  For full details, please see H&P, progress notes, consult notes and ancillary notes.  Briefly,  Kaitlin Gomez is an 89 year old female with prior CVA, CAD, type 2 diabetes, hypertension, dyslipidemia, carotid disease, known coronary artery stenosis, who presented to the emergency department after she woke up and felt weak in both legs.    She reports she tried to walk but was unable to do so.  Ultimately patient was found to have acute posterior CVA, new onset A-fib with RVR.  Cardiology, neurology, vascular surgery consulted.  Ultimately patient has refused any surgical intervention and we are proceeding with medical management only.  Stay has now been prolonged since 8/21 pending insurance authorization for SNF.   Acute CVA - Brain MRI showed Acute infarct in posterior limb of right internal capsule.  Per neuro, the severity of the bilateral LE weakness is not consistent with MRI findings.  MRI of the cervical spine does show severe spinal stenosis at C4-5 with cord compression. This may be contributing to patient's BLE weakness which seems to have been progressive over some time. --Per neuro, patient in atrial fibrillation and therefore infarct likely cardioembolic in etiology. --LDL 107. A1c 6.0. Echocardiogram shows no cardiac source of emboli, EF 50-55%.  --switch statin to Lipitor 40 mg daily --PT/OT SNF rehab.   Lower extremity weakness - Greater than expected given stroke findings.  Cervical spine MRI reveals spinal stenosis at C4-5 with cord compression though this is chronic.  Suspect her weakness  is multifactorial from new CVA and chronic spinal stenosis. - Patient has declined surgical intervention with neurosurgery or vascular surgery. --PT/OT SNF rehab.   Carotid stenosis left, very severe --Carotid dopplers show 80% LICA stenosis.  - Vascular surgery was consulted and has recommended CTA however patient refuses surgical intervention even if offered.   --cont ASA and statin  History of CVA - 2023 patient was diagnosed with bilateral small embolic infarcts thought to be cardioembolic though atrial fibrillation was not diagnosed at that time.  At that time she was determined not to be a candidate for anticoagulation due to recent GI bleed and anemia.  She did receive loop recorder during that time.   New onset atrial fibrillation, with RVR - Patient denies history of this.  Did have cardiac monitor in the past and there was concern of possible A-fib in the past from likely cardioembolic CVA 2023 - Did have RVR this admission which resolved with one-time push metoprolol .  RVR again overnight which self resolved. --cont home lopressor  --started Eliquis   Hypertensive urgency ruled out - Blood pressure overnight 9/19 reported to be severely elevated.  This was blood pressure cuff issue.  When measured accurately blood pressures have been normotensive.     Hypertension --cont home amlodipine , clonidine , Lisinopril  and Lopressor . --d/c'ed home Lasix    AKI - Baseline creatinine appears to be 0.88 from 1 year ago.  Creatinine 3.3 on arrival with elevated BUN. - Likely prerenal given poor p.o. intake and downtime.  Also likely some degree of ATN given mild rhabdomyolysis and possible hypotension at home with  A-fib RVR. --Cr improved to 1.12 prior to discharge. --d/c'ed home Lasix    Rhabdomyolysis, nontraumatic --729 on presentation.  Resolved.   10 mm nonobstructing left renal stone - Aware.  No flank pain currently.  Stone is nonobstructive.     Type 2 diabetes - Hemoglobin A1c  6.0% --resume home metformin after discharge.   Discharge Diagnoses:  Principal Problem:   AKI (acute kidney injury) Active Problems:   Hypertensive urgency   Fall at home, initial encounter   Dyslipidemia   Type 2 diabetes mellitus without complications (HCC)   Weakness   Atrial fibrillation with RVR (HCC)   Acute stroke due to ischemia [facility])   PAD (peripheral artery disease)   Stenosis of carotid artery   30 Day Unplanned Readmission Risk Score    Flowsheet Row ED to Hosp-Admission (Current) from 02/13/2024 in [facility] 1C MEDICAL TELEMETRY  30 Day Unplanned Readmission Risk Score (%) 14.1 Filed at 02/19/2024 1600    This score is the patient's risk of an unplanned readmission within 30 days of being discharged (0 -100%). The score is based on dignosis, age, lab data, medications, orders, and past utilization.   Low:  0-14.9   Medium: 15-21.9   High: 22-29.9   Extreme: 30 and above         Discharge Instructions:  Allergies as of 02/19/2024       Reactions   Advil [ibuprofen] Rash        Medication List     STOP taking these medications    furosemide  40 MG tablet Commonly known as: LASIX    HYDROcodone -acetaminophen  5-325 MG tablet Commonly known as: Norco   lovastatin 40 MG tablet Commonly known as: MEVACOR       TAKE these medications    amLODipine  10 MG tablet Commonly known as: NORVASC  Take 10 mg by mouth daily.   apixaban  5 MG Tabs tablet Commonly known as: ELIQUIS  Take 1 tablet (5 mg total) by mouth 2 (two) times daily.   aspirin  EC 81 MG tablet Take 81 mg by mouth daily. Swallow whole.   atorvastatin  40 MG tablet Commonly known as: LIPITOR Take 1 tablet (40 mg total) by mouth daily. Start taking on: February 20, 2024   cloNIDine  0.3 MG tablet Commonly known as: CATAPRES  Take 0.3 mg by mouth 2 (two) times daily.   latanoprost  0.005 % ophthalmic solution Commonly known as: XALATAN  Place 1 drop into both  eyes at bedtime.   lisinopril  40 MG tablet Commonly known as: ZESTRIL  Take 40 mg by mouth daily.   metFORMIN 500 MG 24 hr tablet Commonly known as: GLUCOPHAGE-XR Take 500 mg by mouth daily.   metoprolol  tartrate 50 MG tablet Commonly known as: LOPRESSOR  Take 1 tablet (50 mg total) by mouth 2 (two) times daily.   nystatin  cream Commonly known as: MYCOSTATIN  Apply topically 2 (two) times daily. Apply to bil breast folds   OneTouch Ultra test strip Generic drug: glucose blood 1 each by Other route.   Oscal 500/200 D-3 500-200 MG-UNIT tablet Generic drug: calcium -vitamin D  Take 1 tablet by mouth daily.         Contact information for follow-up providers     Buren Rock HERO, MD Follow up.   Specialty: Family Medicine Contact information: [address] [place] [address] [phone]              Contact information for after-discharge care     Destination     [facility] .  Service: Skilled Nursing Contact information: [address] [place]  [address] [phone]                     Allergies  Allergen Reactions   Advil [Ibuprofen] Rash     The results of significant diagnostics from this hospitalization (including imaging, microbiology, ancillary and laboratory) are listed below for reference.   Consultations:   Procedures/Studies: US  Carotid Bilateral Result Date: 02/15/2024 CLINICAL DATA:  89 year old female with neurologic deficit, acute lacunar infarct posterior limb right internal capsule on brain MRI. EXAM: BILATERAL CAROTID DUPLEX ULTRASOUND TECHNIQUE: Elnor scale imaging, color Doppler and duplex ultrasound were performed of bilateral carotid and vertebral arteries in the neck. COMPARISON:  Brain MRI 02/14/2024.  CTA neck 03/07/2022. FINDINGS: Criteria: Quantification of carotid stenosis is based on velocity parameters that correlate the residual internal carotid diameter with NASCET-based stenosis  levels, using the diameter of the distal internal carotid lumen as the denominator for stenosis measurement. The following velocity measurements were obtained: RIGHT ICA: 65 cm/sec CCA: 77 cm/sec SYSTOLIC ICA/CCA RATIO:  0.8 ECA: 90 cm/sec LEFT ICA: 216 cm/sec CCA: 52 cm/sec SYSTOLIC ICA/CCA RATIO:  4.1 ECA: 65 cm/sec RIGHT CAROTID ARTERY: Shadowing echogenic calcified plaque, appearing similar to the prior CTA. RIGHT VERTEBRAL ARTERY:  Patent with antegrade flow detected. LEFT CAROTID ARTERY: Bulky plaque with high-grade stenosis. This was seen to be approaching a radiographic string sign on the 2023 CTA (numerically estimated at 80% at that time, series 5, image 39 of that exam). Pronounced Spectral broadening on this exam (images 57, 60). LEFT VERTEBRAL ARTERY:  Patent with antegrade flow detected. IMPRESSION: 1. Positive for Very Severe Atherosclerotic Stenosis of the proximal Left ICA, was numerically estimated at 80% by CTA in 2023, could be Near Occlusive now. Recommend Vascular Surgery consultation. 2. Mild contralateral right carotid bifurcation atherosclerosis, probably not significantly changed from prior CTA. Electronically Signed   By: VEAR Hurst M.D.   On: 02/15/2024 05:19   MR LUMBAR SPINE WO CONTRAST Result Date: 02/15/2024 EXAM: MRI LUMBAR SPINE 02/15/2024 02:51:22 AM TECHNIQUE: Multiplanar multisequence MRI of the lumbar spine was performed without the administration of intravenous contrast. COMPARISON: None available. CLINICAL HISTORY: Myelopathy, acute, lumbar spine. FINDINGS: BONES AND ALIGNMENT: Normal alignment. Normal vertebral body heights. Bone marrow signal is unremarkable. SPINAL CORD: The conus terminates normally. SOFT TISSUES: No paraspinal mass. L1-L2: No significant disc herniation. No spinal canal stenosis or neural foraminal narrowing. L2-L3: Small disc bulge and mild facet hypertrophy with narrowing of the lateral recesses. No central spinal canal or neural foraminal stenosis.  L3-L4: Small disc bulge and mild facet hypertrophy. Mild left foraminal stenosis. L4-L5: Small central disc protrusion. L5-S1: Intermediate size disc bulge with endplate spurring and narrowing of the lateral recesses. Mild central spinal canal stenosis. Mild bilateral foraminal stenosis. IMPRESSION: 1. L5-S1 mild spinal canal stenosis and mild bilateral neural foraminal stenosis 2.  L3-4 mild left foraminal stenosis Electronically signed by: Franky Stanford MD 02/15/2024 03:20 AM EDT RP Workstation: [id number]   MR CERVICAL SPINE WO CONTRAST Result Date: 02/15/2024 EXAM: MRI CERVICAL SPINE WITHOUT CONTRAST 02/15/2024 02:51:22 AM TECHNIQUE: Multiplanar multisequence MRI of the cervical spine was performed. COMPARISON: CT from 01/23/2015. CLINICAL HISTORY: Myelopathy, acute, cervical spine. FINDINGS: BONES AND ALIGNMENT: Normal alignment. Normal vertebral body heights. Bone marrow signal is unremarkable. SPINAL CORD: There is faint hyperintense T2 weighted signal within the spinal cord at C4-C5. SOFT TISSUES: No paraspinal mass. C2-C3: Small central disc protrusion and mild left facet hypertrophy.  No central spinal canal or neural foraminal stenosis. C3-C4: Small disc bulge with bilateral uncovertebral hypertrophy. Severe bilateral neuroforaminal stenosis. C4-C5: Large disc osteophyte complex with severe spinal canal stenosis and compression of the spinal cord. Severe bilateral foraminal stenosis. Compared to the CT from 01/23/2015, the degree of stenosis is likely unchanged. C5-C6: Disc space narrowing with uncovertebral hypertrophy causing severe bilateral foraminal stenosis. C6-C7: Left greater than right uncovertebral hypertrophy with mild right and moderate left foraminal stenosis. C7-T1: No significant disc herniation. No spinal canal stenosis or neural foraminal narrowing. IMPRESSION: 1. C4-5 severe spinal canal stenosis and compression of the spinal cord with severe bilateral neuroforaminal stenosis. Allowing  for differences in modality, this is likely unchanged from 01/23/2015. 2. C5-6 severe bilateral foraminal stenosis 3. C3-4 severe bilateral neural foraminal stenosis 4. C6-7 moderate left neural foraminal stenosis Electronically signed by: Franky Stanford MD 02/15/2024 03:11 AM EDT RP Workstation: [id number]   ECHOCARDIOGRAM COMPLETE Result Date: 02/14/2024    ECHOCARDIOGRAM REPORT   Patient Name:   KEISY STRICKLER Date of Exam: 02/14/2024 Medical Rec #:  [id number]      Height:       67.0 in Accession #:    [id number]     Weight:       212.5 lb Date of Birth:  [birth year]      BSA:          2.075 m Patient Age:    89 years       BP:           132/63 mmHg Patient Gender: F              HR:           58 bpm. Exam Location:  [facility] Procedure: 2D Echo, Cardiac Doppler and Color Doppler (Both Spectral and Color            Flow Doppler were utilized during procedure). STAT ECHO Indications:     Abnormal ECG R94.31  History:         Patient has prior history of Echocardiogram examinations, most                  recent 02/26/2022. Arrythmias:Atrial Fibrillation.  Sonographer:     Ashley McNeely-Sloane Referring Phys:  [id number] ALEXANDRA DEZII Diagnosing Phys: Timothy Gollan MD IMPRESSIONS  1. Left ventricular ejection fraction, by estimation, is 50 to 55%. The left ventricle has low normal function. The left ventricle demonstrates regional wall motion abnormalities (hypokinesis of the basal inferior wall). There is moderate left ventricular hypertrophy. Left ventricular diastolic parameters are indeterminate.  2. Right ventricular systolic function is normal. The right ventricular size is normal. Tricuspid regurgitation signal is inadequate for assessing PA pressure.  3. Left atrial size was mildly dilated.  4. The mitral valve is normal in structure. Mild mitral valve regurgitation. No evidence of mitral stenosis.  5. The aortic valve has an indeterminant number of cusps. Aortic valve regurgitation is not visualized. No aortic  stenosis is present.  6. The inferior vena cava is normal in size with greater than 50% respiratory variability, suggesting right atrial pressure of 3 mmHg. FINDINGS  Left Ventricle: Left ventricular ejection fraction, by estimation, is 50 to 55%. The left ventricle has low normal function. The left ventricle demonstrates regional wall motion abnormalities. Strain was performed and the global longitudinal strain is indeterminate. The left ventricular internal cavity size was normal in size. There is moderate left ventricular hypertrophy. Left ventricular diastolic parameters are indeterminate. Right  Ventricle: The right ventricular size is normal. No increase in right ventricular wall thickness. Right ventricular systolic function is normal. Tricuspid regurgitation signal is inadequate for assessing PA pressure. Left Atrium: Left atrial size was mildly dilated. Right Atrium: Right atrial size was normal in size. Pericardium: There is no evidence of pericardial effusion. Mitral Valve: The mitral valve is normal in structure. Mild mitral annular calcification. Mild mitral valve regurgitation. No evidence of mitral valve stenosis. MV peak gradient, 4.4 mmHg. The mean mitral valve gradient is 1.0 mmHg. Tricuspid Valve: The tricuspid valve is normal in structure. Tricuspid valve regurgitation is mild . No evidence of tricuspid stenosis. Aortic Valve: The aortic valve has an indeterminant number of cusps. Aortic valve regurgitation is not visualized. No aortic stenosis is present. Aortic valve mean gradient measures 1.0 mmHg. Aortic valve peak gradient measures 2.6 mmHg. Aortic valve area, by VTI measures 2.06 cm. Pulmonic Valve: The pulmonic valve was normal in structure. Pulmonic valve regurgitation is not visualized. No evidence of pulmonic stenosis. Aorta: The aortic root is normal in size and structure. Venous: The inferior vena cava is normal in size with greater than 50% respiratory variability, suggesting right  atrial pressure of 3 mmHg. IAS/Shunts: No atrial level shunt detected by color flow Doppler. Additional Comments: 3D was performed not requiring image post processing on an independent workstation and was indeterminate.  LEFT VENTRICLE PLAX 2D LVIDd:         3.90 cm   Diastology LVIDs:         3.40 cm   LV e' medial:    5.55 cm/s LV PW:         2.00 cm   LV E/e' medial:  16.1 LV IVS:        1.30 cm   LV e' lateral:   10.10 cm/s LVOT diam:     1.80 cm   LV E/e' lateral: 8.9 LV SV:         29 LV SV Index:   14 LVOT Area:     2.54 cm  RIGHT VENTRICLE RV S prime:     8.70 cm/s TAPSE (M-mode): 1.9 cm LEFT ATRIUM             Index        RIGHT ATRIUM           Index LA diam:        4.40 cm 2.12 cm/m   RA Area:     14.00 cm LA Vol (A2C):   59.7 ml 28.77 ml/m  RA Volume:   27.30 ml  13.16 ml/m LA Vol (A4C):   82.0 ml 39.52 ml/m LA Biplane Vol: 69.9 ml 33.68 ml/m  AORTIC VALVE                    PULMONIC VALVE AV Area (Vmax):    2.11 cm     PV Vmax:        0.96 m/s AV Area (Vmean):   1.90 cm     PV Vmean:       67.600 cm/s AV Area (VTI):     2.06 cm     PV VTI:         0.177 m AV Vmax:           80.60 cm/s   PV Peak grad:   3.7 mmHg AV Vmean:          55.600 cm/s  PV Mean grad:   2.0 mmHg AV VTI:  0.142 m      RVOT Peak grad: 2 mmHg AV Peak Grad:      2.6 mmHg AV Mean Grad:      1.0 mmHg LVOT Vmax:         66.90 cm/s LVOT Vmean:        41.500 cm/s LVOT VTI:          0.115 m LVOT/AV VTI ratio: 0.81  AORTA Ao Root diam: 2.90 cm Ao Asc diam:  2.50 cm MITRAL VALVE MV Area (PHT): 3.39 cm    SHUNTS MV Area VTI:   1.23 cm    Systemic VTI:  0.12 m MV Peak grad:  4.4 mmHg    Systemic Diam: 1.80 cm MV Mean grad:  1.0 mmHg    Pulmonic VTI:  0.120 m MV Vmax:       1.05 m/s MV Vmean:      50.8 cm/s MV Decel Time: 224 msec MV E velocity: 89.60 cm/s Evalene Lunger MD Electronically signed by Evalene Lunger MD Signature Date/Time: 02/14/2024/1:22:43 PM    Final    MR BRAIN WO CONTRAST Result Date: 02/14/2024 EXAM:  MRI BRAIN WITHOUT CONTRAST 02/14/2024 12:35:23 PM TECHNIQUE: Multiplanar multisequence MRI of the head/brain was performed without the administration of intravenous contrast. COMPARISON: MRI brain 03/03/2022. CLINICAL HISTORY: Neuro deficit, acute, stroke suspected; left side weakness, onset >12 hours ago. History of CVA. FINDINGS: BRAIN AND VENTRICLES: Acute infarct in the right posterior limb of the internal capsule. Background of moderate chronic small vessel disease with old infarct along the left middle frontal gyrus and old infarct in the medial aspect of the left cerebellar hemisphere. Temporoparietal predominant volume loss. No intracranial hemorrhage. No mass. No midline shift. No hydrocephalus. The sella is unremarkable. Normal flow voids. ORBITS: No acute abnormality. SINUSES AND MASTOIDS: No acute abnormality. BONES AND SOFT TISSUES: Normal marrow signal. No acute soft tissue abnormality. IMPRESSION: 1. Acute infarct in the posterior limb of the right internal capsule. 2. Background of moderate chronic small vessel disease with old infarcts along the left middle frontal gyrus and medial aspect of the left cerebellar hemisphere. Electronically signed by: Ryan Chess MD 02/14/2024 12:40 PM EDT RP Workstation: [id number]   US  Renal Result Date: 02/14/2024 CLINICAL DATA:  Renal failure EXAM: RENAL / URINARY TRACT ULTRASOUND COMPLETE COMPARISON:  09/13/2018 FINDINGS: Right Kidney: Renal measurements: 10.8 x 5.0 x 4.8 cm. = volume: 135 mL. Simple cyst is noted measuring up to 1.3 cm. No follow-up is recommended. Mild increased echogenicity is noted. No cortical thinning is seen. Left Kidney: Renal measurements: 10.6 x 6.3 x 4.6 cm. = volume: 161 mL. No mass lesion or hydronephrosis is noted. 10 mm echogenic focus is noted consistent with nonobstructing stone. This is stable from the prior CT. Mild increased echogenicity is noted. Bladder: Appears normal for degree of bladder distention. Other: None.  IMPRESSION: Changes consistent with medical renal disease. 10 mm nonobstructing left renal stone. No other focal abnormality is noted. Electronically Signed   By: Oneil Devonshire M.D.   On: 02/14/2024 02:13   DG Chest Portable 1 View Result Date: 02/13/2024 CLINICAL DATA:  Fall injury.  Polytrauma. EXAM: PORTABLE CHEST 1 VIEW PORTABLE PELVIS 1 VIEW COMPARISON:  Portable chest 03/03/2022, CTA abdomen and pelvis 09/13/2022. FINDINGS: Chest AP portable 11:02 p.m.: There is overlying material and telemetry leads. The lungs are mildly emphysematous but clear. There is mild chronic elevation of the right hemidiaphragm. The sulci are sharp. Mild cardiomegaly. There is a normal mediastinal configuration.  Calcification of the transverse aorta. Thoracic cage is intact. Moderate thoracic spondylosis and bridging enthesopathy. Osteopenia. Portable AP pelvis: No pelvic fracture or diastasis is seen. No other focal bone lesions. Osteopenia. Symmetric moderate to advanced bilateral hip arthrosis with axial and superior joint space loss and slight acetabular protrusio with acetabular and femoral head osteophytes. Patchy calcification both common femoral arteries. Otherwise unremarkable soft tissues. Postsurgical change again noted right lower abdominal quadrant. IMPRESSION: 1. No evidence of acute chest disease.  Stable COPD chest. 2. Mild cardiomegaly. 3. No evidence of pelvic fracture or diastasis. 4. Osteopenia and degenerative change. 5. Aortic atherosclerosis. 6. Moderate to advanced bilateral hip arthrosis. Electronically Signed   By: Francis Quam M.D.   On: 02/13/2024 23:23   DG Pelvis 1-2 Views Result Date: 02/13/2024 CLINICAL DATA:  Fall injury.  Polytrauma. EXAM: PORTABLE CHEST 1 VIEW PORTABLE PELVIS 1 VIEW COMPARISON:  Portable chest 03/03/2022, CTA abdomen and pelvis 09/13/2022. FINDINGS: Chest AP portable 11:02 p.m.: There is overlying material and telemetry leads. The lungs are mildly emphysematous but clear.  There is mild chronic elevation of the right hemidiaphragm. The sulci are sharp. Mild cardiomegaly. There is a normal mediastinal configuration. Calcification of the transverse aorta. Thoracic cage is intact. Moderate thoracic spondylosis and bridging enthesopathy. Osteopenia. Portable AP pelvis: No pelvic fracture or diastasis is seen. No other focal bone lesions. Osteopenia. Symmetric moderate to advanced bilateral hip arthrosis with axial and superior joint space loss and slight acetabular protrusio with acetabular and femoral head osteophytes. Patchy calcification both common femoral arteries. Otherwise unremarkable soft tissues. Postsurgical change again noted right lower abdominal quadrant. IMPRESSION: 1. No evidence of acute chest disease.  Stable COPD chest. 2. Mild cardiomegaly. 3. No evidence of pelvic fracture or diastasis. 4. Osteopenia and degenerative change. 5. Aortic atherosclerosis. 6. Moderate to advanced bilateral hip arthrosis. Electronically Signed   By: Francis Quam M.D.   On: 02/13/2024 23:23      Labs: BNP (last 3 results) Recent Labs    02/13/24 2019  BNP 39.8   Basic Metabolic Panel: Recent Labs  Lab 02/14/24 1552 02/15/24 1131 02/16/24 1844 02/17/24 0609 02/18/24 0322 02/19/24 0304  NA  --  135 135 133* 134* 135  K  --  3.4* 4.6 3.9 4.0 4.3  CL  --  97* 99 97* 98 101  CO2  --  25 25 25 26 27   GLUCOSE  --  195* 140* 115* 133* 137*  BUN  --  38* 40* 35* 38* 44*  CREATININE  --  1.45* 1.18* 1.29* 1.18* 1.12*  CALCIUM   --  8.6* 9.4 8.8* 9.3 9.2  MG 1.7  --  1.8 1.6* 1.8 1.9  PHOS  --   --  3.2 3.1 2.9 3.6   Liver Function Tests: Recent Labs  Lab 02/15/24 1131 02/16/24 1844 02/17/24 0609 02/18/24 0322 02/19/24 0304  AST 31 73* 74* 55* 27  ALT 22 58* 77* 84* 55*  ALKPHOS 53 63 60 71 56  BILITOT 0.9 1.0 0.9 0.7 0.5  PROT 5.9* 6.7 6.4* 6.8 6.2*  ALBUMIN 2.6* 2.8* 2.6* 2.8* 2.7*   No results for input(s): LIPASE, AMYLASE in the last 168  hours. No results for input(s): AMMONIA in the last 168 hours. CBC: Recent Labs  Lab 02/15/24 1131 02/16/24 2215 02/17/24 0609 02/18/24 0322 02/19/24 0304  WBC 8.2 9.8 9.8 9.6 8.6  NEUTROABS 6.5 7.0 7.3 7.1 5.9  HGB 14.7 15.4* 14.4 15.3* 13.5  HCT 44.0 46.1* 42.7 45.7 41.2  MCV 91.7 90.7 90.1 91.0 92.6  PLT 177 238 232 270 281   Cardiac Enzymes: Recent Labs  Lab 02/14/24 0124 02/15/24 1131 02/16/24 1844  CKTOTAL 729* 113 22*   BNP: Invalid input(s): POCBNP CBG: Recent Labs  Lab 02/18/24 1213 02/18/24 1721 02/18/24 1942 02/19/24 0809 02/19/24 1156  GLUCAP 164* 138* 168* 116* 172*   D-Dimer No results for input(s): DDIMER in the last 72 hours. Hgb A1c No results for input(s): HGBA1C in the last 72 hours. Lipid Profile No results for input(s): CHOL, HDL, LDLCALC, TRIG, CHOLHDL, LDLDIRECT in the last 72 hours. Thyroid function studies No results for input(s): TSH, T4TOTAL, T3FREE, THYROIDAB in the last 72 hours.  Invalid input(s): FREET3 Anemia work up No results for input(s): VITAMINB12, FOLATE, FERRITIN, TIBC, IRON, RETICCTPCT in the last 72 hours. Urinalysis    Component Value Date/Time   COLORURINE YELLOW (A) 02/14/2024 0113   APPEARANCEUR HAZY (A) 02/14/2024 0113   APPEARANCEUR Cloudy 10/27/2011 1403   LABSPEC 1.009 02/14/2024 0113   LABSPEC 1.017 10/27/2011 1403   PHURINE 5.0 02/14/2024 0113   GLUCOSEU NEGATIVE 02/14/2024 0113   GLUCOSEU Negative 10/27/2011 1403   HGBUR MODERATE (A) 02/14/2024 0113   BILIRUBINUR NEGATIVE 02/14/2024 0113   BILIRUBINUR Negative 10/27/2011 1403   KETONESUR NEGATIVE 02/14/2024 0113   PROTEINUR NEGATIVE 02/14/2024 0113   NITRITE NEGATIVE 02/14/2024 0113   LEUKOCYTESUR MODERATE (A) 02/14/2024 0113   LEUKOCYTESUR 3+ 10/27/2011 1403   Sepsis Labs Recent Labs  Lab 02/16/24 2215 02/17/24 0609 02/18/24 0322 02/19/24 0304  WBC 9.8 9.8 9.6 8.6   Microbiology No results found  for this or any previous visit (from the past 240 hours).   Total time spend on discharging this patient, including the last patient exam, discussing the hospital stay, instructions for ongoing care as it relates to all pertinent caregivers, as well as preparing the medical discharge records, prescriptions, and/or referrals as applicable, is 35 minutes.    Ellouise Haber, MD  [facility] 02/19/2024, 4:20 PM

## 2024-02-19 NOTE — TOC Transition Note (Addendum)
 Transition of Care [facility]) - Discharge Note   Patient Details  Name: Kaitlin Gomez MRN: [id number] Date of Birth: 22-Dec-1934  Transition of Care [facility]) CM/SW Contact:  [place] L Damonte Frieson, LCSW Phone Number: 02/19/2024, 4:45 PM   Clinical Narrative:     Discharge summary is in. Patient is discharging to [facility]. [facility] was contacted for transportation. Patients brother was notified of the discharge. Madelin [facility], confirmed that patient can discharge today to room 121.   No further TOC needs identified. TOC signing off.   Final next level of care: Skilled Nursing Facility Barriers to Discharge: Barriers Resolved   Patient Goals and CMS Choice   CMS Medicare.gov Compare Post Acute Care list provided to:: Patient   [place] ownership interest in [facility].provided to:: Patient    Discharge Placement                Patient to be transferred to facility by: [facility] Name of family member notified: Rogerio Russel (Brother)  [phone] Patient and family notified of of transfer: 02/19/24  Discharge Plan and Services Additional resources added to the After Visit Summary for                                       Social Drivers of Health (SDOH) Interventions SDOH Screenings   Food Insecurity: No Food Insecurity (02/17/2024)  Housing: Low Risk  (02/17/2024)  Transportation Needs: No Transportation Needs (02/17/2024)  Utilities: Not At Risk (02/17/2024)  Social Connections: Socially Isolated (02/17/2024)  Tobacco Use: Low Risk  (02/13/2024)     Readmission Risk Interventions    03/07/2022   12:00 PM  Readmission Risk Prevention Plan  Transportation Screening Complete  PCP or Specialist Appt within 5-7 Days Complete  Home Care Screening Complete  Medication Review (RN CM) Complete

## 2024-02-19 NOTE — TOC Progression Note (Signed)
 Transition of Care [facility]) - Progression Note    Patient Details  Name: Kaitlin Gomez MRN: [id number] Date of Birth: 1934-12-31  Transition of Care [facility]) CM/SW Contact  [place] L Cortina [place], [place] Phone Number: 02/19/2024, 1:28 PM  Clinical Narrative:      CSW received a call from [place], [facility]. There is no female bed at the facility. There is a bed for patient at [facility]. Tammy advised that they can offer a bed at [facility] and they will transfer her to [place] once a bed is available.   CSW spoke with patient about the bed offer. Patient is willing to accept bed at [facility] until they can transfer her to [facility]. Auth will have to be switched.    Barriers to Discharge: Insurance Authorization, No SNF bed               Expected Discharge Plan and Services         Expected Discharge Date: 02/18/24                                     Social Drivers of Health (SDOH) Interventions SDOH Screenings   Food Insecurity: No Food Insecurity (02/17/2024)  Housing: Low Risk  (02/17/2024)  Transportation Needs: No Transportation Needs (02/17/2024)  Utilities: Not At Risk (02/17/2024)  Social Connections: Socially Isolated (02/17/2024)  Tobacco Use: Low Risk  (02/13/2024)    Readmission Risk Interventions    03/07/2022   12:00 PM  Readmission Risk Prevention Plan  Transportation Screening Complete  PCP or Specialist Appt within 5-7 Days Complete  Home Care Screening Complete  Medication Review (RN CM) Complete

## 2024-02-19 NOTE — TOC Progression Note (Signed)
 Transition of Care [facility]) - Progression Note    Patient Details  Name: Kaitlin Gomez MRN: [id number] Date of Birth: March 07, 1935  Transition of Care [facility]) CM/SW Contact  Dalia GORMAN Fuse, RN Phone Number: 02/19/2024, 9:45 AM  Clinical Narrative:     TOC reached out to Madelin Jacobus at [facility] to make her aware that we received ins auth. Tammy is checking to see if they have a female bed at the facility. SNF approved 02/18/24-02/24/24 reference # [id number]                     Expected Discharge Plan and Services         Expected Discharge Date: 02/18/24                                     Social Drivers of Health (SDOH) Interventions SDOH Screenings   Food Insecurity: No Food Insecurity (02/17/2024)  Housing: Low Risk  (02/17/2024)  Transportation Needs: No Transportation Needs (02/17/2024)  Utilities: Not At Risk (02/17/2024)  Social Connections: Socially Isolated (02/17/2024)  Tobacco Use: Low Risk  (02/13/2024)    Readmission Risk Interventions    03/07/2022   12:00 PM  Readmission Risk Prevention Plan  Transportation Screening Complete  PCP or Specialist Appt within 5-7 Days Complete  Home Care Screening Complete  Medication Review (RN CM) Complete

## 2024-02-19 NOTE — Plan of Care (Signed)

## 2024-02-19 NOTE — Plan of Care (Signed)
  Problem: Education: Goal: Knowledge of disease or condition will improve Outcome: Progressing   Problem: Ischemic Stroke/TIA Tissue Perfusion: Goal: Complications of ischemic stroke/TIA will be minimized Outcome: Progressing   Problem: Coping: Goal: Will verbalize positive feelings about self Outcome: Progressing Goal: Will identify appropriate support needs Outcome: Progressing   Problem: Self-Care: Goal: Ability to participate in self-care as condition permits will improve Outcome: Progressing Goal: Verbalization of feelings and concerns over difficulty with self-care will improve Outcome: Progressing Goal: Ability to communicate needs accurately will improve Outcome: Progressing   Problem: Health Behavior/Discharge Planning: Goal: Ability to manage health-related needs will improve Outcome: Progressing Goal: Goals will be collaboratively established with patient/family Outcome: Progressing   Problem: Nutrition: Goal: Risk of aspiration will decrease Outcome: Progressing Goal: Dietary intake will improve Outcome: Progressing

## 2024-02-19 NOTE — Plan of Care (Signed)

## 2024-02-20 ENCOUNTER — Telehealth: Payer: Self-pay | Admitting: Cardiovascular Disease

## 2024-02-20 NOTE — Telephone Encounter (Signed)
 Left voicemail, per Dr. Gollan, patient needs to schedule a hospital follow up appt in several weeks.

## 2024-02-20 NOTE — Telephone Encounter (Signed)
-----   Message from Evalene Lunger sent at 02/16/2024 10:34 AM EDT ----- Seen by cardiology in the hospital for atrial fibrillation Suspect discharged early in the week to skilled nursing facility Needs follow-up in several weeks time Thx TGollan

## 2024-02-21 NOTE — Telephone Encounter (Signed)
 Brother following up because he received call. He says the patient is currently at [facility].

## 2024-02-21 NOTE — Telephone Encounter (Signed)
 Returned call from pt's brother, Corinthia, who has permission to discuss medical info. Bennie lives in [place]  and cannot make appointments for his sister, but is a point of contact if unable to reach pt.  Bennie states that pt was transferred from hospital to rehab [facility]) 2 days ago.  He states that he talked with Ms. Minahan today and she is not sure how long she will remain in rehab.  I told her we would reach out to Ms. Wolfman directly in a couple of weeks to see if she knew of a discharge date and then we would schedule her hospital follow up appointment accordingly.  He thanked me for my phone call.

## 2024-03-02 NOTE — Telephone Encounter (Signed)
 Called and spoke with brother Bennie per DPR. Patient is still in rehab at this time. Brother states that once patient is discharged they will call to schedule her an appointment.

## 2024-06-22 ENCOUNTER — Ambulatory Visit: Payer: No Typology Code available for payment source | Admitting: Orthopedic Surgery

## 2024-07-02 ENCOUNTER — Other Ambulatory Visit: Payer: Self-pay

## 2024-07-02 ENCOUNTER — Encounter: Payer: Self-pay | Admitting: Orthopedic Surgery

## 2024-07-02 ENCOUNTER — Ambulatory Visit: Admitting: Orthopedic Surgery

## 2024-07-02 DIAGNOSIS — M25561 Pain in right knee: Secondary | ICD-10-CM

## 2024-07-02 DIAGNOSIS — G8929 Other chronic pain: Secondary | ICD-10-CM | POA: Diagnosis not present

## 2024-07-02 DIAGNOSIS — M1711 Unilateral primary osteoarthritis, right knee: Secondary | ICD-10-CM | POA: Diagnosis not present

## 2024-07-02 MED ORDER — METHYLPREDNISOLONE ACETATE 40 MG/ML IJ SUSP
40.0000 mg | INTRAMUSCULAR | Status: AC | PRN
  Administered 2024-07-02: 40 mg via INTRA_ARTICULAR

## 2024-07-02 MED ORDER — LIDOCAINE HCL (PF) 1 % IJ SOLN
5.0000 mL | INTRAMUSCULAR | Status: AC | PRN
  Administered 2024-07-02: 5 mL

## 2024-07-02 NOTE — Progress Notes (Signed)
 "  Office Visit Note   Patient: Kaitlin Gomez           Date of Birth: 16-Jun-1934           MRN: [id number] Visit Date: 07/02/2024              Requested by: Buren Rock HERO, MD [address] [place],  [place] [address] PCP: Buren Rock HERO, MD  Chief Complaint  Patient presents with   Right Knee - Pain      HPI: Discussed the use of AI scribe software for clinical note transcription with the patient, who gave verbal consent to proceed.  History of Present Illness Kaitlin Gomez is an 89 year old female with advanced right knee osteoarthritis who presents with right anterior knee pain.  She reports persistent anterior right knee pain that significantly limits her mobility and daily activities. She has been unable to ambulate since a stroke in September 2025 and is currently wheelchair-bound while undergoing therapy at a skilled nursing facility.  Radiographs of the right knee have shown advanced arthritis with paratrabecular cysts, sclerosis, joint space narrowing, and osteophytic bone spurs in the patellofemoral, medial, and lateral compartments, as reported to the patient.  She has previously received intra-articular corticosteroid injections for knee pain, which she describes as painful but tolerable.     Assessment & Plan: Visit Diagnoses:  1. Chronic pain of right knee   2. Unilateral primary osteoarthritis, right knee     Plan: Assessment and Plan Assessment & Plan Primary osteoarthritis of right knee Advanced tricompartmental osteoarthritis confirmed by radiographs. Administered intra-articular corticosteroid injection to the right knee.      Follow-Up Instructions: Return if symptoms worsen or fail to improve.   Ortho Exam  Patient is alert, oriented, no adenopathy, well-dressed, normal affect, normal respiratory effort. Physical Exam MUSCULOSKELETAL: Valgus alignment of right knee. Effusion in right knee. Tenderness on palpation of right knee and lateral  joint line. Crepitus in right knee with range of motion. Collateral ligaments stable in right knee.      Imaging: XR Knee 1-2 Views Right Result Date: 07/02/2024 2 view radiographs of the right knee shows tricompartmental arthritic changes with subchondral cysts and sclerosis with osteophytic bone spurs in all 3 compartments with joint space narrowing.  No images are attached to the encounter.  Labs: Lab Results  Component Value Date   HGBA1C 6.0 (H) 02/14/2024   HGBA1C 6.3 (H) 02/25/2022   ESRSEDRATE 25 02/15/2024   CRP 14.7 (H) 02/16/2024   CRP 1.2 (H) 10/30/2016   REPTSTATUS 02/08/2023 FINAL 02/05/2023   GRAMSTAIN  02/05/2023    RARE WBC PRESENT, PREDOMINANTLY PMN MODERATE GRAM POSITIVE COCCI FEW GRAM NEGATIVE RODS    CULT  02/05/2023    RARE KLEBSIELLA PNEUMONIAE FEW HAEMOPHILUS PARAINFLUENZAE ABUNDANT BACTEROIDES FRAGILIS BETA LACTAMASE POSITIVE Performed at [facility], [address], [place], [place] [address]    IDOLINA ROA PNEUMONIAE 02/05/2023     Lab Results  Component Value Date   ALBUMIN 2.7 (L) 02/19/2024   ALBUMIN 2.8 (L) 02/18/2024   ALBUMIN 2.6 (L) 02/17/2024    Lab Results  Component Value Date   MG 1.9 02/19/2024   MG 1.8 02/18/2024   MG 1.6 (L) 02/17/2024   No results found for: VD25OH  No results found for: PREALBUMIN    Latest Ref Rng & Units 02/19/2024    3:04 AM 02/18/2024    3:22 AM 02/17/2024    6:09 AM  CBC  EXTENDED  WBC 4.0 - 10.5 K/uL 8.6  9.6  9.8   RBC 3.87 - 5.11 MIL/uL 4.45  5.02  4.74   Hemoglobin 12.0 - 15.0 g/dL 86.4  84.6  85.5   HCT 36.0 - 46.0 % 41.2  45.7  42.7   Platelets 150 - 400 K/uL 281  270  232   NEUT# 1.7 - 7.7 K/uL 5.9  7.1  7.3   Lymph# 0.7 - 4.0 K/uL 1.5  1.3  1.4      There is no height or weight on file to calculate BMI.  Orders:  Orders Placed This Encounter  Procedures   Large Joint Inj   XR Knee 1-2 Views Right   No orders of the defined types were placed in this  encounter.    Procedures: Large Joint Inj: R knee on 07/02/2024 2:25 PM Indications: pain and diagnostic evaluation Details: 22 G 1.5 in needle, anteromedial approach  Arthrogram: No  Medications: 5 mL lidocaine  (PF) 1 %; 40 mg methylPREDNISolone  acetate 40 MG/ML Outcome: tolerated well, no immediate complications Procedure, treatment alternatives, risks and benefits explained, specific risks discussed. Consent was given by the patient. Immediately prior to procedure a time out was called to verify the correct patient, procedure, equipment, support staff and site/side marked as required. Patient was prepped and draped in the usual sterile fashion.      Clinical Data: No additional findings.  ROS:  All other systems negative, except as noted in the HPI. Review of Systems  Objective: Vital Signs: There were no vitals taken for this visit.  Specialty Comments:  No specialty comments available.  PMFS History: Patient Active Problem List   Diagnosis Date Noted   Hypertensive urgency 02/14/2024   Dyslipidemia 02/14/2024   Type 2 diabetes mellitus without complications (HCC) 02/14/2024   Fall at home, initial encounter 02/14/2024   Weakness 02/14/2024   Atrial fibrillation with RVR (HCC) 02/14/2024   Acute stroke due to ischemia (HCC) 02/14/2024   PAD (peripheral artery disease) 02/14/2024   Stenosis of carotid artery 02/14/2024   AKI (acute kidney injury) 02/13/2024   Sinus bradycardia 02/25/2022   Hyperlipidemia 02/25/2022   BRBPR (bright red blood per rectum) 10/30/2016   Diabetes (HCC) 10/30/2016   HTN (hypertension) 10/30/2016   Crohn's disease (HCC) 10/30/2016   Lower GI bleed 10/30/2016   Colitis 12/31/2014   Past Medical History:  Diagnosis Date   Carotid artery occlusion    Crohn's disease (HCC)    Diabetes mellitus without complication (HCC)    Hyperlipemia    Hypertension    Hypokalemia 02/25/2022   Lower GI bleeding 09/13/2022    Family History   Problem Relation Age of Onset   Hypertension Father     Past Surgical History:  Procedure Laterality Date   COLONOSCOPY WITH PROPOFOL  N/A 01/05/2015   Procedure: COLONOSCOPY WITH PROPOFOL ;  Surgeon: Gladis RAYMOND Mariner, MD;  Location: [facility] ENDOSCOPY;  Service: Endoscopy;  Laterality: N/A;   COLONOSCOPY WITH PROPOFOL  N/A 11/02/2016   Procedure: COLONOSCOPY WITH PROPOFOL ;  Surgeon: Therisa Bi, MD;  Location: [facility] ENDOSCOPY;  Service: Endoscopy;  Laterality: N/A;   COLONOSCOPY WITH PROPOFOL  N/A 09/14/2022   Procedure: COLONOSCOPY WITH PROPOFOL ;  Surgeon: Jinny Carmine, MD;  Location: [facility] ENDOSCOPY;  Service: Endoscopy;  Laterality: N/A;   IR ANGIOGRAM SELECTIVE EACH ADDITIONAL VESSEL  02/25/2022   IR ANGIOGRAM SELECTIVE EACH ADDITIONAL VESSEL  02/25/2022   IR ANGIOGRAM SELECTIVE EACH ADDITIONAL VESSEL  02/25/2022   IR ANGIOGRAM VISCERAL SELECTIVE  02/25/2022  IR EMBO ART  VEN HEMORR LYMPH EXTRAV  INC GUIDE ROADMAPPING  02/25/2022   IR US  GUIDE VASC ACCESS RIGHT  02/25/2022   Social History   Occupational History   Occupation: [employer] - Retired  Tobacco Use   Smoking status: Never   Smokeless tobacco: Never  Vaping Use   Vaping status: Never Used  Substance and Sexual Activity   Alcohol use: No   Drug use: No   Sexual activity: Not Currently         "
